# Patient Record
Sex: Male | Born: 1960 | Race: White | Hispanic: No | Marital: Married | Smoking: Never smoker
Health system: Southern US, Community
[De-identification: ages and names within clinical notes are randomized; demographics above are authoritative.]

## PROBLEM LIST (undated history)

## (undated) DIAGNOSIS — E785 Hyperlipidemia, unspecified: Secondary | ICD-10-CM

## (undated) DIAGNOSIS — M1120 Other chondrocalcinosis, unspecified site: Secondary | ICD-10-CM

## (undated) DIAGNOSIS — I10 Essential (primary) hypertension: Secondary | ICD-10-CM

## (undated) DIAGNOSIS — E1165 Type 2 diabetes mellitus with hyperglycemia: Principal | ICD-10-CM

## (undated) DIAGNOSIS — E1149 Type 2 diabetes mellitus with other diabetic neurological complication: Secondary | ICD-10-CM

## (undated) DIAGNOSIS — Q824 Ectodermal dysplasia (anhidrotic): Secondary | ICD-10-CM

## (undated) DIAGNOSIS — I25119 Atherosclerotic heart disease of native coronary artery with unspecified angina pectoris: Secondary | ICD-10-CM

## (undated) HISTORY — DX: Essential (primary) hypertension: I10

## (undated) HISTORY — DX: Type 2 diabetes mellitus with hyperglycemia: E11.65

## (undated) HISTORY — DX: Other chondrocalcinosis, unspecified site: M11.20

## (undated) HISTORY — DX: Type 2 diabetes mellitus with other diabetic neurological complication: E11.49

## (undated) HISTORY — DX: Atherosclerotic heart disease of native coronary artery with unspecified angina pectoris: I25.119

## (undated) HISTORY — DX: Hyperlipidemia, unspecified: E78.5

## (undated) HISTORY — DX: Ectodermal dysplasia (anhidrotic): Q82.4

## (undated) HISTORY — PX: APPENDECTOMY: SHX54

---

## 1986-02-27 HISTORY — PX: RIB FRACTURE SURGERY: SHX2358

## 2003-01-08 ENCOUNTER — Encounter: Payer: Self-pay | Admitting: Internal Medicine

## 2004-01-04 ENCOUNTER — Ambulatory Visit: Payer: Self-pay | Admitting: Internal Medicine

## 2004-07-04 ENCOUNTER — Ambulatory Visit: Payer: Self-pay | Admitting: Internal Medicine

## 2005-01-02 ENCOUNTER — Ambulatory Visit: Payer: Self-pay | Admitting: Internal Medicine

## 2005-07-10 ENCOUNTER — Ambulatory Visit: Payer: Self-pay | Admitting: Internal Medicine

## 2006-01-08 ENCOUNTER — Ambulatory Visit: Payer: Self-pay | Admitting: Internal Medicine

## 2007-10-14 ENCOUNTER — Telehealth: Payer: Self-pay | Admitting: Family Medicine

## 2008-02-20 ENCOUNTER — Telehealth: Payer: Self-pay | Admitting: Family Medicine

## 2008-04-23 ENCOUNTER — Ambulatory Visit: Payer: Self-pay | Admitting: Internal Medicine

## 2008-04-23 DIAGNOSIS — Q824 Ectodermal dysplasia (anhidrotic): Secondary | ICD-10-CM

## 2008-04-23 DIAGNOSIS — E1165 Type 2 diabetes mellitus with hyperglycemia: Secondary | ICD-10-CM

## 2008-04-23 DIAGNOSIS — IMO0002 Reserved for concepts with insufficient information to code with codable children: Secondary | ICD-10-CM

## 2008-04-23 DIAGNOSIS — I10 Essential (primary) hypertension: Secondary | ICD-10-CM

## 2008-04-23 HISTORY — DX: Reserved for concepts with insufficient information to code with codable children: IMO0002

## 2008-04-23 HISTORY — DX: Type 2 diabetes mellitus with hyperglycemia: E11.65

## 2008-04-23 LAB — HM DIABETES FOOT EXAM

## 2008-04-28 LAB — CONVERTED CEMR LAB
AST: 29 units/L (ref 0–37)
Albumin: 4 g/dL (ref 3.5–5.2)
Alkaline Phosphatase: 74 units/L (ref 39–117)
BUN: 20 mg/dL (ref 6–23)
Basophils Absolute: 0 10*3/uL (ref 0.0–0.1)
Basophils Relative: 0 % (ref 0.0–3.0)
Bilirubin, Direct: 0.1 mg/dL (ref 0.0–0.3)
CO2: 21 meq/L (ref 19–32)
Calcium: 9.8 mg/dL (ref 8.4–10.5)
Chloride: 100 meq/L (ref 96–112)
Creatinine, Ser: 1.2 mg/dL (ref 0.4–1.5)
Eosinophils Absolute: 0.2 10*3/uL (ref 0.0–0.7)
Glucose, Bld: 337 mg/dL — ABNORMAL HIGH (ref 70–99)
HDL: 28.4 mg/dL — ABNORMAL LOW (ref >39.0)
Lymphocytes Relative: 28.7 % (ref 12.0–46.0)
MCHC: 35.4 g/dL (ref 30.0–36.0)
MCV: 83.9 fL (ref 78.0–100.0)
Neutrophils Relative %: 66.6 % (ref 43.0–77.0)
Platelets: 174 10*3/uL (ref 150–400)
RBC: 5.15 M/uL (ref 4.22–5.81)
TSH: 1.33 microintl units/mL (ref 0.35–5.50)
Triglycerides: 759 mg/dL (ref 0–149)
VLDL: 152 mg/dL — ABNORMAL HIGH (ref 0–40)
WBC: 7.2 10*3/uL (ref 4.5–10.5)

## 2009-08-16 ENCOUNTER — Telehealth (INDEPENDENT_AMBULATORY_CARE_PROVIDER_SITE_OTHER): Payer: Self-pay | Admitting: *Deleted

## 2009-08-16 ENCOUNTER — Encounter: Payer: Self-pay | Admitting: Internal Medicine

## 2010-01-07 ENCOUNTER — Telehealth: Payer: Self-pay | Admitting: Internal Medicine

## 2010-03-29 NOTE — Progress Notes (Signed)
Summary: refill request for ibuprofen  Phone Note Refill Request Message from:  Fax from Pharmacy  Refills Requested: Medication #1:  IBUPROFEN 800 MG  TABS one by mouth three times a day as needed for pseudogout   Last Refilled: 03/26/2009  Medication #2:  GLIPIZIDE XL 5 MG XR24H-TAB 1 daily before  breakfast by mouth.  Medication #3:  METFORMIN HCL 1000 MG TABS 1 tablet twice a day by mouth Faxed request from [facility] is on your desk.  Initial call taken by: Marty Heck CMA,  August 16, 2009 8:49 AM  Follow-up for Phone Call        okay to refill #100 x 4 Follow-up by: Claris Gower MD,  August 16, 2009 12:24 PM    Prescriptions: GLIPIZIDE XL 5 MG XR24H-TAB (GLIPIZIDE) 1 daily before  breakfast by mouth  #30 x 0   Entered by:   Christena Deem CMA ([place])   Authorized by:   Claris Gower MD   Signed by:   Christena Deem CMA ([place]) on 08/16/2009   Method used:   Electronically to        [facility] [address] (retail)       [place], [place]  [address]       Ph: [phone]       Fax: [phone]   RxID:   [id number] METFORMIN HCL 1000 MG TABS (METFORMIN HCL) 1 tablet twice a day by mouth  #60 x 0   Entered by:   Christena Deem CMA ([place])   Authorized by:   Claris Gower MD   Signed by:   Christena Deem CMA ([place]) on 08/16/2009   Method used:   Electronically to        [facility] [address] (retail)       [place], [place]  [address]       Ph: [phone]       Fax: [phone]   RxID:   [id number] IBUPROFEN 800 MG  TABS (IBUPROFEN) one by mouth three times a day as needed for pseudogout  #100 x 0   Entered by:   Christena Deem CMA ([place])   Authorized by:   Claris Gower MD   Signed by:   Christena Deem CMA ([place]) on 08/16/2009   Method used:   Electronically to        [facility]. [address] (retail)       [place], [place]  [address]       Ph:  [phone]       Fax: [phone]   RxID:   [id number]

## 2010-03-29 NOTE — Progress Notes (Signed)
Summary: Med refills  Phone Note Refill Request Call back at [phone] Message from:  [facility] on January 07, 2010 11:55 AM  Refills Requested: Medication #1:  IBUPROFEN 800 MG  TABS one by mouth three times a day as needed for pseudogout   Last Refilled: 08/16/2009  Medication #2:  GLIPIZIDE XL 5 MG XR24H-TAB 1 daily before  breakfast by mouth.   Last Refilled: 10/20/2009  Medication #3:  PRINIVIL 20 MG TABS one tab by mouth once daily.   Last Refilled: 08/16/2009  Medication #4:  METFORMIN HCL 1000 MG TABS Take 1 tablet by mouth once a day   Last Refilled: 08/16/2009 faxed requests   Method Requested: Electronic Initial call taken by: DeShannon Smith CMA Deborra Medina),  January 07, 2010 11:56 AM Call placed to: Patient  Follow-up for Phone Call        calling pt to advise that he needs a follow-up in order to get refills, all the numbers in pt's chart are incorrect, called [facility] pharmacy to see if they have a number and they didn't. DeShannon Smith CMA Deborra Medina)  January 07, 2010 12:31 PM   patient called office and stated he does not have insurance and or a job and can't come in for office visit. Please advise. DeShannon Smith [place] Deborra Medina)  January 07, 2010 12:34 PM   Please have him apply for aid through [facility] for reduction in costs Okay to fill meds for 1 month pending that attempt I cannot continue to care for him without a visit. If he doesn't qualify for help from [facility] or cannot pay, he should [facility] Follow-up by: Claris Gower MD,  January 07, 2010 1:46 PM  Additional Follow-up for Phone Call Additional follow up Details #1::        per pt he applied for the reduced pay thru [facility] 2010 and never received anything back, per pt he would like to apply again, pt states [facility] [place]. Rx's sent for 32mth to [facility]. DeShannon Smith CMA Deborra Medina)  January 07, 2010 1:54 PM   form left at the front desk for  pt to pick-up [place] Deborra Medina)  January 07, 2010 1:58 PM   Caren Griffins,  Please look into this or facilitate his application since he never heard back the first time Claris Gower MD  January 07, 2010 2:02 PM   Sp w/ representative w/ Pt accting, they did not have a application on file.  The nurse Karena Addison)  sent pt a application for indigent care thru [employer] . We also sent him copy of information thru  [facility] in [place] thru  [facility] in [place]. These program can assist w/ medical care and medications. Mailed this information to the pt, also called pt detailed message.Marland KitchenMarland KitchenAllen Norris  January 10, 2010 8:28 AM   Thanks We are doing what we can and gave him some meds to carry him over till he can make arrangements for an appt somewhere  Additional Follow-up by: Claris Gower MD,  January 10, 2010 1:37 PM    -Prescriptions: GLIPIZIDE XL 5 MG XR24H-TAB (GLIPIZIDE) 1 daily before  breakfast by mouth  #30 x 0   Entered by:   Edwin Dada CMA ([place])   Authorized by:   Claris Gower MD   Signed by:   Edwin Dada CMA ([place]) on 01/07/2010   Method used:   Electronically  to        [facility] [address] (retail)       [place], [place]  [address]       Ph: [phone]       Fax: [phone]   RxID:   [id number] METFORMIN HCL 1000 MG TABS (METFORMIN HCL) Take 1 tablet by mouth once a day  #30 x 0   Entered by:   Edwin Dada CMA ([place])   Authorized by:   Claris Gower MD   Signed by:   Edwin Dada CMA ([place]) on 01/07/2010   Method used:   Electronically to        [facility] [address] (retail)       [place], [place]  [address]       Ph: [phone]       Fax: [phone]   RxID:   [id number] PRINIVIL 20 MG TABS (LISINOPRIL) one tab by mouth once daily.  #30 x 0   Entered by:   Edwin Dada CMA ([place])   Authorized by:   Claris Gower MD    Signed by:   Edwin Dada CMA ([place]) on 01/07/2010   Method used:   Electronically to        [facility] [address]* (retail)       [place], [place]  [address]       Ph: [phone]       Fax: [phone]   RxID:   [id number] IBUPROFEN 800 MG  TABS (IBUPROFEN) one by mouth three times a day as needed for pseudogout  #30 x 0   Entered by:   Edwin Dada CMA ([place])   Authorized by:   Claris Gower MD   Signed by:   Edwin Dada CMA ([place]) on 01/07/2010   Method used:   Electronically to        [facility] [address]* (retail)       [place], [place]  [address]       Ph: [phone]       Fax: [phone]   RxID:   [id number]

## 2010-03-29 NOTE — Miscellaneous (Signed)
  Medications Added METFORMIN HCL 1000 MG TABS (METFORMIN HCL) Take 1 tablet by mouth once a day       Clinical Lists Changes  Medications: Changed medication from METFORMIN HCL 1000 MG TABS (METFORMIN HCL) 1 tablet twice a day by mouth to METFORMIN HCL 1000 MG TABS (METFORMIN HCL) Take 1 tablet by mouth once a day

## 2010-04-02 ENCOUNTER — Ambulatory Visit: Payer: Self-pay

## 2010-04-05 ENCOUNTER — Encounter: Payer: Self-pay | Admitting: Internal Medicine

## 2010-04-20 NOTE — Letter (Signed)
Summary: Disability Exam/NCDDS  Disability Exam/NCDDS   Imported By: Edmonia James 04/13/2010 13:59:54  _____________________________________________________________________  External Attachment:    Type:   Image     Comment:   External Document  Appended Document: Disability Exam/NCDDS disabled mostly due to gout

## 2010-05-18 ENCOUNTER — Other Ambulatory Visit: Payer: Self-pay | Admitting: Internal Medicine

## 2010-05-18 MED ORDER — IBUPROFEN 800 MG PO TABS: ORAL_TABLET | ORAL | Status: DC

## 2010-05-18 NOTE — Telephone Encounter (Signed)
Okay #100 x 3  Let him know he can get 750 of the 200mg  tabs for about $7.50 at [facility] (this would be equivalent of almost 200 of the prescription 800mg  dose)

## 2010-10-19 ENCOUNTER — Other Ambulatory Visit: Payer: Self-pay | Admitting: Internal Medicine

## 2010-11-22 ENCOUNTER — Other Ambulatory Visit: Payer: Self-pay | Admitting: Internal Medicine

## 2011-01-24 ENCOUNTER — Encounter: Payer: Self-pay | Admitting: Internal Medicine

## 2011-01-25 ENCOUNTER — Encounter: Payer: Self-pay | Admitting: Internal Medicine

## 2011-01-25 ENCOUNTER — Ambulatory Visit (INDEPENDENT_AMBULATORY_CARE_PROVIDER_SITE_OTHER): Payer: Self-pay | Admitting: Internal Medicine

## 2011-01-25 DIAGNOSIS — I10 Essential (primary) hypertension: Secondary | ICD-10-CM

## 2011-01-25 DIAGNOSIS — E1149 Type 2 diabetes mellitus with other diabetic neurological complication: Secondary | ICD-10-CM

## 2011-01-25 DIAGNOSIS — E785 Hyperlipidemia, unspecified: Secondary | ICD-10-CM

## 2011-01-25 MED ORDER — LISINOPRIL 20 MG PO TABS: 20.0000 mg | ORAL_TABLET | Freq: Every day | ORAL | Status: DC

## 2011-01-25 MED ORDER — GLIPIZIDE 5 MG PO TABS: 5.0000 mg | ORAL_TABLET | Freq: Two times a day (BID) | ORAL | Status: DC

## 2011-01-25 MED ORDER — IBUPROFEN 800 MG PO TABS: ORAL_TABLET | ORAL | Status: DC

## 2011-01-25 MED ORDER — METFORMIN HCL 1000 MG PO TABS: 1000.0000 mg | ORAL_TABLET | Freq: Two times a day (BID) | ORAL | Status: DC

## 2011-01-25 NOTE — Assessment & Plan Note (Signed)
LDL ~150 Will plan to start statin next time (after on the other meds for a while)

## 2011-01-25 NOTE — Progress Notes (Signed)
  Subjective:    Patient ID: Aaron Mckenzie, male    DOB: 06-02-1960, 50 y.o.   MRN: [id number]  HPI Finally back to work at the [employer] in [place] Now has insurance again  No meds for BP or diabetes for some time Occ checks BP --- fluctuates between 120-150 Still keeps up with yard work  Did have slight uncomfortable feeling in chest this AM Has happened a couple of times Wondering if it is related to increased BP Never occurs during exertion Breathing has been fine  Doesn't check sugars at all  Ongoing pain in knees and feet Ibuprofen helps the knees only Has drainage now from elbows for many months   Current Outpatient Prescriptions on File Prior to Visit  Medication Sig Dispense Refill  . glipiZIDE (GLUCOTROL XL) 5 MG 24 hr tablet Take 5 mg by mouth daily.        Marland Kitchen ibuprofen (ADVIL,MOTRIN) 800 MG tablet Take one tablet by mouth three times daily as needed for pseudogout  100 tablet  3  . lisinopril (PRINIVIL,ZESTRIL) 20 MG tablet Take 20 mg by mouth daily.        . metFORMIN (GLUCOPHAGE) 1000 MG tablet Take 1,000 mg by mouth daily with breakfast.          No Known Allergies  Past Medical History  Diagnosis Date  . Diabetes mellitus   . Hypertension   . Pseudogout   . Ectodermal dysplasia     Past Surgical History  Procedure Date  . Appendectomy   . Rib fracture surgery 1988    right    Family History  Problem Relation Age of Onset  . Arthritis Mother   . Arthritis Father     History   Social History  . Marital Status: Single    Spouse Name: N/A    Number of Children: N/A  . Years of Education: N/A   Occupational History  . student    Social History Main Topics  . Smoking status: Never Smoker   . Smokeless tobacco: Never Used  . Alcohol Use: No  . Drug Use: No  . Sexually Active: Not on file   Other Topics Concern  . Not on file   Social History Narrative  . No narrative on file   Review of Systems Constant feet pain--affects his  sleep (has to get up after 2-3 hours). Does have some PM somnolence Weight is down a few pounds lately     Objective:   Physical Exam  Constitutional: He appears well-developed and well-nourished. No distress.  Neck: Normal range of motion. Neck supple.  Cardiovascular: Normal rate, regular rhythm and normal heart sounds.  Exam reveals no gallop.   No murmur heard.      Faint pedal pulses  Pulmonary/Chest: Effort normal and breath sounds normal. No respiratory distress. He has no wheezes. He has no rales.  Musculoskeletal: He exhibits no edema.       Red, thickened olecranon bursae bilaterally  Lymphadenopathy:    He has no cervical adenopathy.  Neurological:       Decreased sensation on plantar feet  Skin:       No foot lesions  Psychiatric: He has a normal mood and affect. His behavior is normal. Judgment and thought content normal.          Assessment & Plan:

## 2011-01-25 NOTE — Assessment & Plan Note (Signed)
BP Readings from Last 3 Encounters:  01/25/11 153/97  04/23/08 140/90   Was okay on the med Will restart lisinopril Check renal function on follow up

## 2011-01-25 NOTE — Assessment & Plan Note (Signed)
Ibuprofen gives relief

## 2011-01-25 NOTE — Assessment & Plan Note (Signed)
Has been out of meds for some time Will restart the metformin and glipizide Check labs at follow up

## 2011-03-15 ENCOUNTER — Ambulatory Visit: Payer: Self-pay | Admitting: Internal Medicine

## 2011-03-23 ENCOUNTER — Ambulatory Visit: Payer: Self-pay | Admitting: Internal Medicine

## 2011-03-23 ENCOUNTER — Encounter: Payer: Self-pay | Admitting: Internal Medicine

## 2011-03-23 ENCOUNTER — Ambulatory Visit (INDEPENDENT_AMBULATORY_CARE_PROVIDER_SITE_OTHER): Payer: Managed Care, Other (non HMO) | Admitting: Internal Medicine

## 2011-03-23 VITALS — BP 122/80 | HR 92 | Temp 97.7°F | Ht 69.0 in | Wt 203.0 lb

## 2011-03-23 DIAGNOSIS — I10 Essential (primary) hypertension: Secondary | ICD-10-CM

## 2011-03-23 DIAGNOSIS — E1149 Type 2 diabetes mellitus with other diabetic neurological complication: Secondary | ICD-10-CM

## 2011-03-23 DIAGNOSIS — R079 Chest pain, unspecified: Secondary | ICD-10-CM

## 2011-03-23 DIAGNOSIS — E785 Hyperlipidemia, unspecified: Secondary | ICD-10-CM

## 2011-03-23 MED ORDER — GABAPENTIN 300 MG PO CAPS: 300.0000 mg | ORAL_CAPSULE | Freq: Every day | ORAL | Status: DC

## 2011-03-23 NOTE — Patient Instructions (Signed)
Please start the gabapentin at 1 capsule nightly (300mg ) for 3 days. If the pain isn't much better, increase to 2 capsules nightly. If the pain isn't much improved on this dose after a couple of weeks, please call

## 2011-03-23 NOTE — Assessment & Plan Note (Signed)
Will consider Rx if LDL over 100 Pravastatin for $10 plan

## 2011-03-23 NOTE — Assessment & Plan Note (Addendum)
Sounds very atypical EKG completely normal Discussed worrisome symptoms like fatigue and SOB---to call 911 Otherwise just observation

## 2011-03-23 NOTE — Progress Notes (Signed)
  Subjective:    Patient ID: Aaron Mckenzie, male    DOB: September 01, 1960, 51 y.o.   MRN: [id number]  HPI Doing okay Back on meds  Hasn't been checking sugars No hypoglycemic reactions Really troubled by foot pain --burning. Mostly at night and not too bad in day  Gets some chest pain at work Dull ache by sternum Doesn't slow down No diaphoresis, SOB or nausea Not heartburn Lasts 15 minutes at most----at most once a week He feels it could be stress related  Current Outpatient Prescriptions on File Prior to Visit  Medication Sig Dispense Refill  . glipiZIDE (GLUCOTROL) 5 MG tablet Take 1 tablet (5 mg total) by mouth 2 (two) times daily before a meal.  180 tablet  3  . ibuprofen (ADVIL,MOTRIN) 800 MG tablet Take one tablet by mouth three times daily as needed for pseudogout  100 tablet  3  . lisinopril (PRINIVIL,ZESTRIL) 20 MG tablet Take 1 tablet (20 mg total) by mouth daily.  90 tablet  3  . metFORMIN (GLUCOPHAGE) 1000 MG tablet Take 1 tablet (1,000 mg total) by mouth 2 (two) times daily with a meal.  180 tablet  3    No Known Allergies  Past Medical History  Diagnosis Date  . Diabetes mellitus   . Hypertension   . Pseudogout   . Ectodermal dysplasia   . Hyperlipidemia     Past Surgical History  Procedure Date  . Appendectomy   . Rib fracture surgery 1988    right    Family History  Problem Relation Age of Onset  . Arthritis Mother   . Arthritis Father     History   Social History  . Marital Status: Single    Spouse Name: N/A    Number of Children: N/A  . Years of Education: N/A   Occupational History  . student    Social History Main Topics  . Smoking status: Never Smoker   . Smokeless tobacco: Never Used  . Alcohol Use: No  . Drug Use: No  . Sexually Active: Not on file   Other Topics Concern  . Not on file   Social History Narrative  . No narrative on file   Review of Systems Has lost some weight--may be due to being back at work Sleep is  not great. Only 2-3 hours. Has to get up due to pain    Objective:   Physical Exam  Constitutional: He appears well-developed and well-nourished. No distress.  Neck: Normal range of motion. Neck supple. No thyromegaly present.  Cardiovascular: Normal rate, regular rhythm, normal heart sounds and intact distal pulses.  Exam reveals no gallop.   No murmur heard. Pulmonary/Chest: Effort normal and breath sounds normal. No respiratory distress. He has no wheezes. He has no rales.  Abdominal: Soft. There is no tenderness.  Musculoskeletal: He exhibits no edema and no tenderness.  Lymphadenopathy:    He has no cervical adenopathy.  Skin:       Scaling and dystrophic nails on feet but no ulcers  Psychiatric: He has a normal mood and affect. His behavior is normal. Judgment and thought content normal.          Assessment & Plan:

## 2011-03-23 NOTE — Assessment & Plan Note (Signed)
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

## 2011-03-23 NOTE — Assessment & Plan Note (Signed)
BP Readings from Last 3 Encounters:  03/23/11 122/80  01/25/11 153/97  04/23/08 140/90   Back under control with lisinopril

## 2011-03-24 LAB — CBC WITH DIFFERENTIAL/PLATELET
Basophils Absolute: 0 10*3/uL (ref 0.0–0.1)
Eosinophils Relative: 3.9 % (ref 0.0–5.0)
Lymphocytes Relative: 25.3 % (ref 12.0–46.0)
Lymphs Abs: 2 10*3/uL (ref 0.7–4.0)
Monocytes Relative: 4.8 % (ref 3.0–12.0)
Neutrophils Relative %: 65.8 % (ref 43.0–77.0)
Platelets: 265 10*3/uL (ref 150.0–400.0)
RDW: 13.7 % (ref 11.5–14.6)
WBC: 7.8 10*3/uL (ref 4.5–10.5)

## 2011-03-24 LAB — LIPID PANEL
HDL: 34.8 mg/dL — ABNORMAL LOW (ref >39.00)
Total CHOL/HDL Ratio: 7
Triglycerides: 453 mg/dL — ABNORMAL HIGH (ref 0.0–149.0)

## 2011-03-24 LAB — HEPATIC FUNCTION PANEL
Albumin: 4.3 g/dL (ref 3.5–5.2)
Alkaline Phosphatase: 58 U/L (ref 39–117)
Bilirubin, Direct: 0 mg/dL (ref 0.0–0.3)
Total Bilirubin: 0.7 mg/dL (ref 0.3–1.2)

## 2011-03-24 LAB — BASIC METABOLIC PANEL
BUN: 34 mg/dL — ABNORMAL HIGH (ref 6–23)
Calcium: 10.1 mg/dL (ref 8.4–10.5)
Creatinine, Ser: 1.6 mg/dL — ABNORMAL HIGH (ref 0.4–1.5)
GFR: 47.6 mL/min — ABNORMAL LOW (ref >60.00)
Glucose, Bld: 77 mg/dL (ref 70–99)

## 2011-03-24 LAB — MICROALBUMIN / CREATININE URINE RATIO
Creatinine,U: 51.8 mg/dL
Microalb, Ur: 5.8 mg/dL — ABNORMAL HIGH (ref 0.0–1.9)

## 2011-03-24 LAB — TSH: TSH: 1.27 u[IU]/mL (ref 0.35–5.50)

## 2011-03-31 ENCOUNTER — Telehealth: Payer: Self-pay | Admitting: Internal Medicine

## 2011-03-31 ENCOUNTER — Encounter: Payer: Self-pay | Admitting: *Deleted

## 2011-03-31 MED ORDER — PRAVASTATIN SODIUM 40 MG PO TABS: 40.0000 mg | ORAL_TABLET | Freq: Every day | ORAL | Status: DC

## 2011-03-31 NOTE — Telephone Encounter (Signed)
Pt called for most recent lab results. Call back # [phone]

## 2011-03-31 NOTE — Telephone Encounter (Signed)
rx sent to pharmacy by e-script Spoke with patient and advised results letter mailed to patients home address with results.

## 2011-06-01 ENCOUNTER — Other Ambulatory Visit: Payer: Self-pay | Admitting: Internal Medicine

## 2011-06-02 ENCOUNTER — Telehealth: Payer: Self-pay

## 2011-06-02 MED ORDER — GABAPENTIN 300 MG PO CAPS: 600.0000 mg | ORAL_CAPSULE | Freq: Two times a day (BID) | ORAL | Status: DC

## 2011-06-02 NOTE — Telephone Encounter (Signed)
rx sent to pharmacy by e-script Spoke with patient and advised results   

## 2011-06-02 NOTE — Telephone Encounter (Signed)
Okay to change to 2 tabs bid #120 x 5

## 2011-06-02 NOTE — Telephone Encounter (Signed)
Pt said has been taking Gabapentin 300 mg 2 tabs in AM and 2 tabs at bedtime and that has helped his foot pain. Gabapentin 300 mg instructions are 1-2 tabs at bedtime. Pt request Gabapentin 300 mg taking 2 twice a day sent to [facility]. Pt can be reached [phone].

## 2011-06-21 ENCOUNTER — Encounter: Payer: Self-pay | Admitting: Internal Medicine

## 2011-06-21 ENCOUNTER — Ambulatory Visit (INDEPENDENT_AMBULATORY_CARE_PROVIDER_SITE_OTHER): Payer: Managed Care, Other (non HMO) | Admitting: Internal Medicine

## 2011-06-21 VITALS — BP 138/70 | HR 86 | Temp 97.5°F | Ht 69.0 in | Wt 207.0 lb

## 2011-06-21 DIAGNOSIS — E1142 Type 2 diabetes mellitus with diabetic polyneuropathy: Secondary | ICD-10-CM

## 2011-06-21 DIAGNOSIS — E785 Hyperlipidemia, unspecified: Secondary | ICD-10-CM

## 2011-06-21 DIAGNOSIS — E1149 Type 2 diabetes mellitus with other diabetic neurological complication: Secondary | ICD-10-CM

## 2011-06-21 DIAGNOSIS — I10 Essential (primary) hypertension: Secondary | ICD-10-CM

## 2011-06-21 LAB — BASIC METABOLIC PANEL
BUN: 35 mg/dL — ABNORMAL HIGH (ref 6–23)
CO2: 18 mEq/L — ABNORMAL LOW (ref 19–32)
Chloride: 110 mEq/L (ref 96–112)
Glucose, Bld: 92 mg/dL (ref 70–99)
Potassium: 4.2 mEq/L (ref 3.5–5.1)
Sodium: 138 mEq/L (ref 135–145)

## 2011-06-21 LAB — HEPATIC FUNCTION PANEL
ALT: 21 U/L (ref 0–53)
AST: 17 U/L (ref 0–37)
Albumin: 4.2 g/dL (ref 3.5–5.2)
Alkaline Phosphatase: 63 U/L (ref 39–117)
Total Protein: 7.6 g/dL (ref 6.0–8.3)

## 2011-06-21 LAB — LIPID PANEL
Cholesterol: 196 mg/dL (ref 0–200)
VLDL: 112.2 mg/dL — ABNORMAL HIGH (ref 0.0–40.0)

## 2011-06-21 LAB — HEMOGLOBIN A1C: Hgb A1c MFr Bld: 7 % — ABNORMAL HIGH (ref 4.6–6.5)

## 2011-06-21 NOTE — Assessment & Plan Note (Signed)
Lab Results  Component Value Date   HGBA1C 9.8* 03/23/2011   Hopefully much better control since back on meds Will recheck

## 2011-06-21 NOTE — Assessment & Plan Note (Signed)
Tolerating the meds Will check labs

## 2011-06-21 NOTE — Progress Notes (Signed)
  Subjective:    Patient ID: Aaron Mckenzie, male    DOB: 1960-05-29, 51 y.o.   MRN: [id number]  HPI Doing okay Foot pain is reasonably controlled in day---does note increased pain at night Not a big problems when at work---when he is on his feet (does note some shooting pains up his leg by the end of his shift) Does awaken with pain at times at night  Hasn't been checking sugars Will give machine No hypoglycemic reactions  No problems with statin No aching or stomach troubles  Gets occ chest pain ---at work mostly.  Clearly seems to be muscular---intermittent and brief No SOB, nausea or diaphoresis with pain No dizziness or syncope  Current Outpatient Prescriptions on File Prior to Visit  Medication Sig Dispense Refill  . glipiZIDE (GLUCOTROL) 5 MG tablet Take 1 tablet (5 mg total) by mouth 2 (two) times daily before a meal.  180 tablet  3  . ibuprofen (ADVIL,MOTRIN) 800 MG tablet TAKE ONE TABLET BY MOUTH THREE TIMES DAILY AS NEEDED FOR PSEUDOGOUT  100 tablet  2  . lisinopril (PRINIVIL,ZESTRIL) 20 MG tablet Take 1 tablet (20 mg total) by mouth daily.  90 tablet  3  . metFORMIN (GLUCOPHAGE) 1000 MG tablet Take 1 tablet (1,000 mg total) by mouth 2 (two) times daily with a meal.  180 tablet  3  . pravastatin (PRAVACHOL) 40 MG tablet Take 1 tablet (40 mg total) by mouth daily.  90 tablet  3  . DISCONTD: gabapentin (NEURONTIN) 300 MG capsule Take 2 capsules (600 mg total) by mouth 2 (two) times daily.  120 capsule  5    No Known Allergies  Past Medical History  Diagnosis Date  . Diabetes mellitus   . Hypertension   . Pseudogout   . Ectodermal dysplasia   . Hyperlipidemia     Past Surgical History  Procedure Date  . Appendectomy   . Rib fracture surgery 1988    right    Family History  Problem Relation Age of Onset  . Arthritis Mother   . Arthritis Father     History   Social History  . Marital Status: Single    Spouse Name: N/A    Number of Children: N/A  .  Years of Education: N/A   Occupational History  . student    Social History Main Topics  . Smoking status: Never Smoker   . Smokeless tobacco: Never Used  . Alcohol Use: No  . Drug Use: No  . Sexually Active: Not on file   Other Topics Concern  . Not on file   Social History Narrative  . 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 No distress.  Neck: Normal range of motion. Neck supple. No thyromegaly present.  Cardiovascular: Normal rate, regular rhythm, normal heart sounds and intact distal pulses.  Exam reveals no gallop.   No murmur heard. Pulmonary/Chest: Effort normal and breath sounds normal. No respiratory distress. He has no wheezes. He has no rales.  Abdominal: Soft. There is no tenderness.  Musculoskeletal: He exhibits no edema and no tenderness.  Lymphadenopathy:    He has no cervical adenopathy.  Skin: No rash noted.       No foot lesions          Assessment & Plan:

## 2011-06-21 NOTE — Patient Instructions (Addendum)
Increase the gabapentin to 3 capsules at night. If the pain is still bad in a week, you can increase to 4 capsules at bedtime Please start a coated aspirin, 81mg , daily

## 2011-06-21 NOTE — Assessment & Plan Note (Signed)
BP Readings from Last 3 Encounters:  06/21/11 138/70  03/23/11 122/80  01/25/11 153/97   Good control on meds No change

## 2011-06-22 ENCOUNTER — Encounter: Payer: Self-pay | Admitting: *Deleted

## 2011-07-28 ENCOUNTER — Other Ambulatory Visit (INDEPENDENT_AMBULATORY_CARE_PROVIDER_SITE_OTHER): Payer: Managed Care, Other (non HMO)

## 2011-07-28 DIAGNOSIS — R6889 Other general symptoms and signs: Secondary | ICD-10-CM

## 2011-07-28 DIAGNOSIS — R899 Unspecified abnormal finding in specimens from other organs, systems and tissues: Secondary | ICD-10-CM

## 2011-07-28 LAB — BASIC METABOLIC PANEL
BUN: 32 mg/dL — ABNORMAL HIGH (ref 6–23)
CO2: 21 mEq/L (ref 19–32)
Chloride: 111 mEq/L (ref 96–112)
Creatinine, Ser: 1.6 mg/dL — ABNORMAL HIGH (ref 0.4–1.5)
Potassium: 4.3 mEq/L (ref 3.5–5.1)

## 2011-08-10 ENCOUNTER — Encounter: Payer: Self-pay | Admitting: *Deleted

## 2011-08-18 ENCOUNTER — Other Ambulatory Visit: Payer: Self-pay | Admitting: Internal Medicine

## 2011-09-27 ENCOUNTER — Ambulatory Visit: Payer: Managed Care, Other (non HMO) | Admitting: Internal Medicine

## 2011-10-04 ENCOUNTER — Other Ambulatory Visit: Payer: Self-pay | Admitting: Internal Medicine

## 2011-10-08 IMAGING — CR DG KNEE 1-2V*R*
1 series · 2 of 2 positions shown · non-contrast
Comparison: none

REASON FOR EXAM: rt knee, foot and ankle pain
COMMENTS:

[Series 1: view not recorded · 0.17mm/px · 2 of 2 slices shown]
[im 1/2]
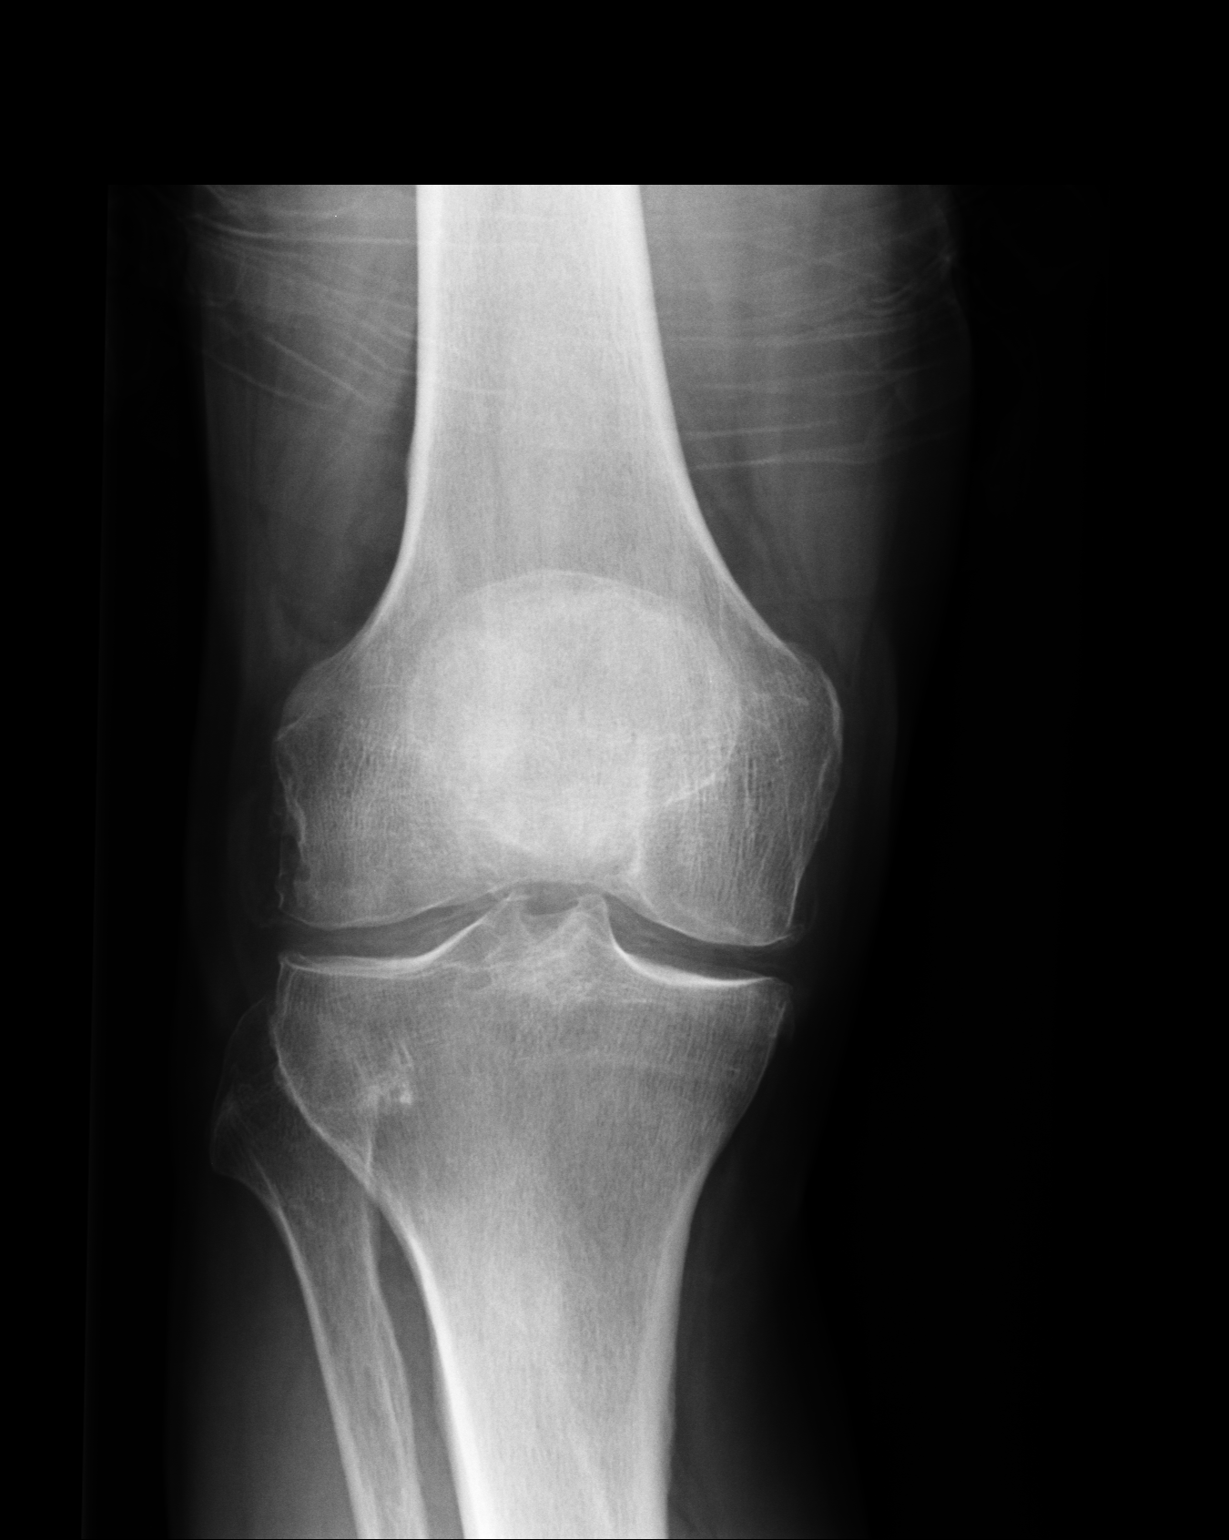
[im 2/2]
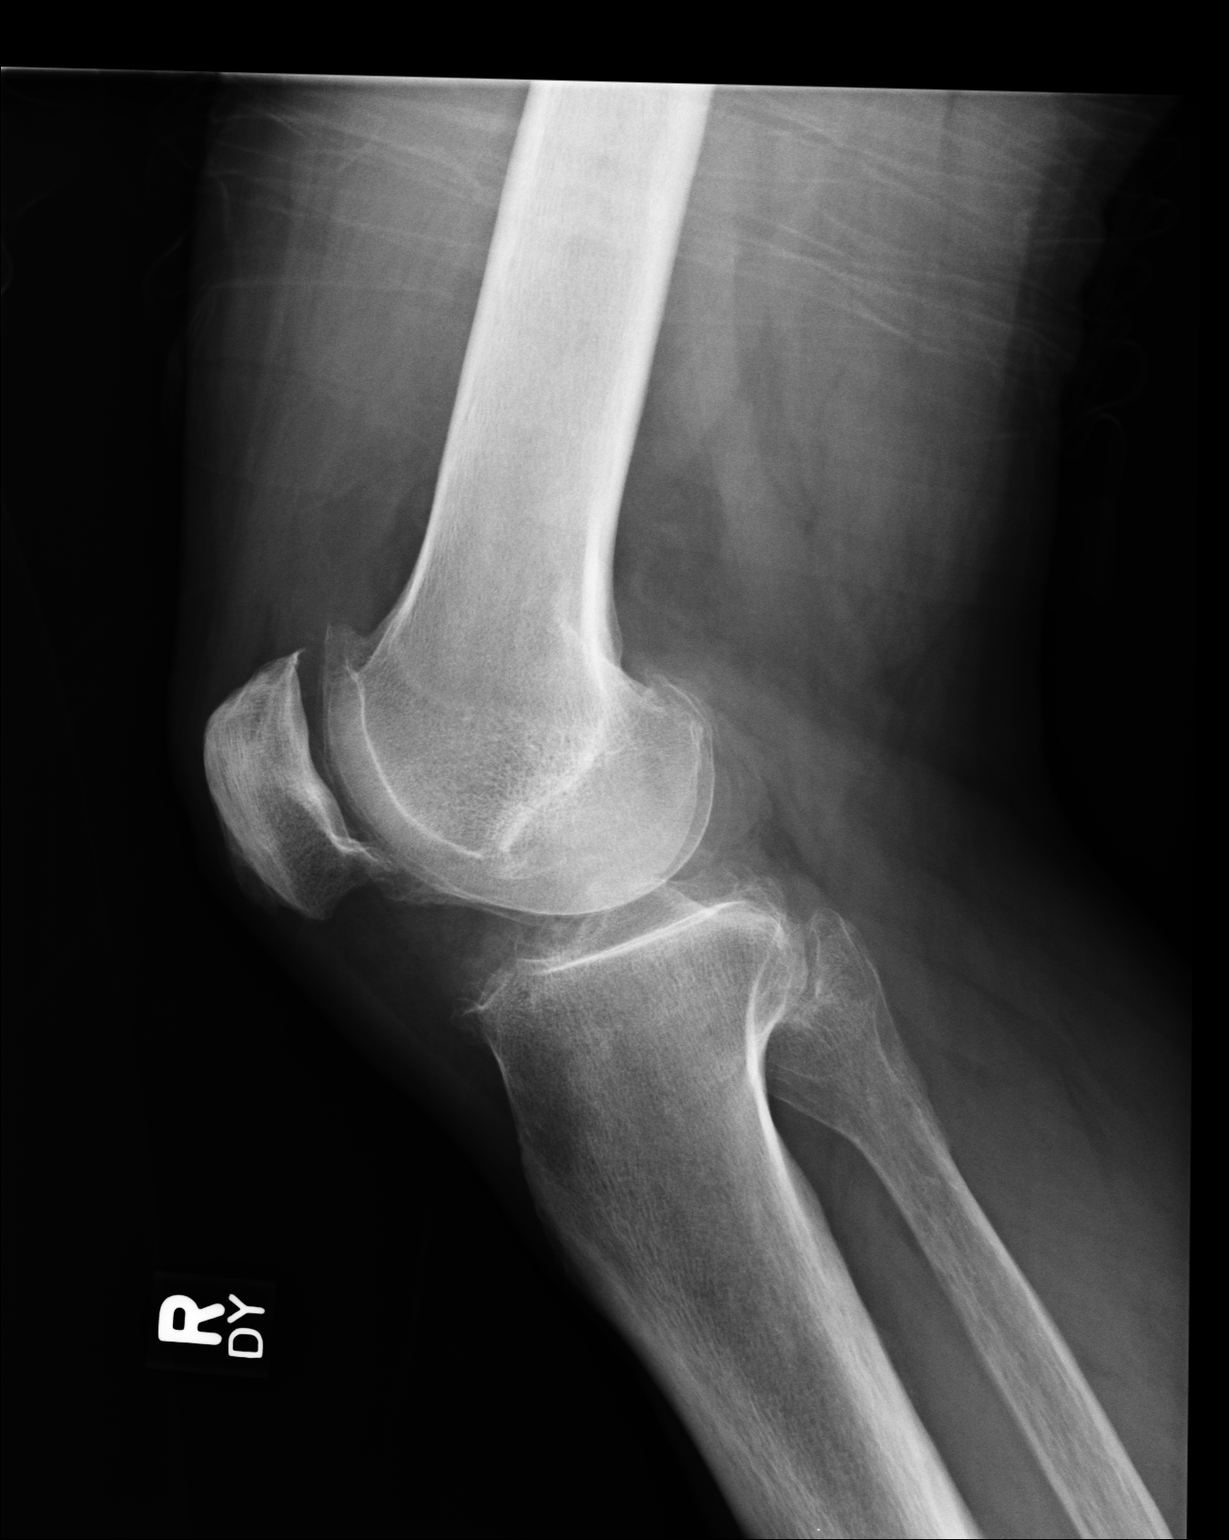

[2 of 2 positions shown; findings below may reference images not displayed]

PROCEDURE:     DXR - DXR KNEE RIGHT AP AND LATERAL  - April 02, 2010 [DATE]

RESULT:     AP and lateral views of the right knee reveal the bones to be
adequately mineralized for age. There is mild degenerative change involving
all 3 joint compartments. I cannot exclude a small joint effusion. I do not
see evidence of an acute fracture.
IMPRESSION: There is mild degenerative change involving all 3 joint
compartments.

## 2011-12-05 ENCOUNTER — Other Ambulatory Visit: Payer: Self-pay | Admitting: Internal Medicine

## 2011-12-05 NOTE — Telephone Encounter (Signed)
rx sent to pharmacy by e-script  

## 2011-12-05 NOTE — Telephone Encounter (Signed)
Okay #100 x 5

## 2011-12-20 ENCOUNTER — Ambulatory Visit: Payer: Managed Care, Other (non HMO) | Admitting: Internal Medicine

## 2011-12-20 DIAGNOSIS — Z0289 Encounter for other administrative examinations: Secondary | ICD-10-CM

## 2011-12-30 ENCOUNTER — Other Ambulatory Visit: Payer: Self-pay | Admitting: Internal Medicine

## 2012-01-08 ENCOUNTER — Encounter: Payer: Self-pay | Admitting: Internal Medicine

## 2012-01-08 ENCOUNTER — Ambulatory Visit (INDEPENDENT_AMBULATORY_CARE_PROVIDER_SITE_OTHER): Payer: Medicare Other | Admitting: Internal Medicine

## 2012-01-08 VITALS — BP 110/70 | HR 100 | Temp 98.1°F | Wt 228.0 lb

## 2012-01-08 DIAGNOSIS — I10 Essential (primary) hypertension: Secondary | ICD-10-CM | POA: Diagnosis not present

## 2012-01-08 DIAGNOSIS — E1142 Type 2 diabetes mellitus with diabetic polyneuropathy: Secondary | ICD-10-CM | POA: Diagnosis not present

## 2012-01-08 DIAGNOSIS — E785 Hyperlipidemia, unspecified: Secondary | ICD-10-CM

## 2012-01-08 DIAGNOSIS — R7989 Other specified abnormal findings of blood chemistry: Secondary | ICD-10-CM

## 2012-01-08 DIAGNOSIS — E1149 Type 2 diabetes mellitus with other diabetic neurological complication: Secondary | ICD-10-CM

## 2012-01-08 DIAGNOSIS — R799 Abnormal finding of blood chemistry, unspecified: Secondary | ICD-10-CM

## 2012-01-08 LAB — BASIC METABOLIC PANEL
CO2: 17 mEq/L — ABNORMAL LOW (ref 19–32)
Calcium: 9.5 mg/dL (ref 8.4–10.5)
Chloride: 104 mEq/L (ref 96–112)
Creatinine, Ser: 1.9 mg/dL — ABNORMAL HIGH (ref 0.4–1.5)
Glucose, Bld: 392 mg/dL — ABNORMAL HIGH (ref 70–99)

## 2012-01-08 MED ORDER — GABAPENTIN 300 MG PO CAPS: 600.0000 mg | ORAL_CAPSULE | Freq: Two times a day (BID) | ORAL | Status: DC

## 2012-01-08 NOTE — Progress Notes (Signed)
Subjective:    Patient ID: Aaron Mckenzie, male    DOB: 1960/11/30, 51 y.o.   MRN: [id number]  HPI Here for follow up Weight is up 20# since spring Thinks it is from eating after leaving work at [employer] every night Plans to get back to [employer] sugars once a week 105-110 usually No hypoglyemic reactions No recent eye exam--had seen Dr Ellin Mayhew  No chest pain Did have some right flank discomfort at work yesterday--felt like spasm (thinks it was nerves since it was his last day) No SOB Some edema---mostly on left. Better after up at night  Nerve pain is constant Worse in bed Better when he is standing on them Taking the gabapentin 600mg  tid Still with evening symptoms Now with some numbness and pain in fingers. Tingling at times. Will feel cold  Current Outpatient Prescriptions on File Prior to Visit  Medication Sig Dispense Refill  . aspirin EC 81 MG tablet Take 81 mg by mouth daily.      Marland Kitchen gabapentin (NEURONTIN) 300 MG capsule TAKE TWO CAPSULES BY MOUTH TWICE DAILY  120 capsule  4  . glipiZIDE (GLUCOTROL) 5 MG tablet Take 1 tablet (5 mg total) by mouth 2 (two) times daily before a meal.  180 tablet  3  . ibuprofen (ADVIL,MOTRIN) 800 MG tablet TAKE ONE TABLET BY MOUTH THREE TIMES DAILY AS NEEDED FOR PSEUDOGOUT  100 tablet  5  . lisinopril (PRINIVIL,ZESTRIL) 20 MG tablet TAKE 1 TABLET (20 MG TOTAL) BY MOUTH DAILY.  90 tablet  0  . metFORMIN (GLUCOPHAGE) 1000 MG tablet Take 1 tablet (1,000 mg total) by mouth 2 (two) times daily with a meal.  180 tablet  3  . pravastatin (PRAVACHOL) 40 MG tablet Take 1 tablet (40 mg total) by mouth daily.  90 tablet  3  . [DISCONTINUED] gabapentin (NEURONTIN) 300 MG capsule Take 600 mg by mouth daily. And 3-4 at bedtime        No Known Allergies  Past Medical History  Diagnosis Date  . Diabetes mellitus   . Hypertension   . Pseudogout   . Ectodermal dysplasia   . Hyperlipidemia     Past Surgical History  Procedure Date  .  Appendectomy   . Rib fracture surgery 1988    right    Family History  Problem Relation Age of Onset  . Arthritis Mother   . Arthritis Father     History   Social History  . Marital Status: Single    Spouse Name: N/A    Number of Children: N/A  . Years of Education: N/A   Occupational History  .      Social History Main Topics  . Smoking status: Never Smoker   . Smokeless tobacco: Never Used  . Alcohol Use: No  . Drug Use: No  . Sexually Active: Not on file   Other Topics Concern  . Not on file   Social History Narrative   Disabled now--on [place] been working part time at [employer] Sleeps okay Intermittent nocturia--relates to eating/drinking late Bowels are okay     Objective:   Physical Exam  Constitutional: He appears well-developed and well-nourished. No distress.  Neck: Normal range of motion. Neck supple. No thyromegaly present.  Cardiovascular: Normal rate, regular rhythm, normal heart sounds and intact distal pulses.  Exam reveals no gallop.   No murmur heard. Pulmonary/Chest: Effort normal and breath sounds normal. No respiratory distress. He has no  wheezes. He has no rales.  Musculoskeletal: He exhibits edema.       1+ pitting edema in left foot and calf Trace on right  Lymphadenopathy:    He has no cervical adenopathy.  Skin: No rash noted.  Psychiatric: He has a normal mood and affect. His behavior is normal.          Assessment & Plan:

## 2012-01-08 NOTE — Assessment & Plan Note (Signed)
No problems with statin 

## 2012-01-08 NOTE — Assessment & Plan Note (Signed)
Seems to still have good control despite the weight gain Encouraged him to watch eating and get back to gym Check a1c

## 2012-01-08 NOTE — Assessment & Plan Note (Signed)
Having more trouble with feet at night Will increase the gabapentin

## 2012-01-08 NOTE — Patient Instructions (Signed)
Please set up your diabetic eye exam with Dr Ellin Mayhew

## 2012-01-08 NOTE — Assessment & Plan Note (Signed)
BP Readings from Last 3 Encounters:  01/08/12 110/70  06/21/11 138/70  03/23/11 122/80   Still well controlled Will recheck renal function

## 2012-01-17 DIAGNOSIS — H40009 Preglaucoma, unspecified, unspecified eye: Secondary | ICD-10-CM | POA: Diagnosis not present

## 2012-01-17 DIAGNOSIS — H40019 Open angle with borderline findings, low risk, unspecified eye: Secondary | ICD-10-CM | POA: Diagnosis not present

## 2012-01-17 DIAGNOSIS — E119 Type 2 diabetes mellitus without complications: Secondary | ICD-10-CM | POA: Diagnosis not present

## 2012-04-13 ENCOUNTER — Other Ambulatory Visit: Payer: Self-pay

## 2012-05-23 ENCOUNTER — Other Ambulatory Visit: Payer: Self-pay | Admitting: Internal Medicine

## 2012-06-26 ENCOUNTER — Other Ambulatory Visit: Payer: Self-pay | Admitting: Internal Medicine

## 2012-07-08 ENCOUNTER — Ambulatory Visit (INDEPENDENT_AMBULATORY_CARE_PROVIDER_SITE_OTHER): Payer: Medicare Other | Admitting: Internal Medicine

## 2012-07-08 ENCOUNTER — Encounter: Payer: Self-pay | Admitting: Internal Medicine

## 2012-07-08 VITALS — BP 110/70 | HR 89 | Temp 98.0°F | Wt 234.0 lb

## 2012-07-08 DIAGNOSIS — E1142 Type 2 diabetes mellitus with diabetic polyneuropathy: Secondary | ICD-10-CM

## 2012-07-08 DIAGNOSIS — E785 Hyperlipidemia, unspecified: Secondary | ICD-10-CM

## 2012-07-08 DIAGNOSIS — Z1211 Encounter for screening for malignant neoplasm of colon: Secondary | ICD-10-CM

## 2012-07-08 DIAGNOSIS — I10 Essential (primary) hypertension: Secondary | ICD-10-CM | POA: Diagnosis not present

## 2012-07-08 DIAGNOSIS — R351 Nocturia: Secondary | ICD-10-CM

## 2012-07-08 DIAGNOSIS — Z0000 Encounter for general adult medical examination without abnormal findings: Secondary | ICD-10-CM

## 2012-07-08 DIAGNOSIS — E1149 Type 2 diabetes mellitus with other diabetic neurological complication: Secondary | ICD-10-CM | POA: Diagnosis not present

## 2012-07-08 LAB — LIPID PANEL: VLDL: 187.4 mg/dL — ABNORMAL HIGH (ref 0.0–40.0)

## 2012-07-08 LAB — CBC WITH DIFFERENTIAL/PLATELET
Eosinophils Relative: 3.2 % (ref 0.0–5.0)
HCT: 40.4 % (ref 39.0–52.0)
Hemoglobin: 13.9 g/dL (ref 13.0–17.0)
Lymphs Abs: 1.8 10*3/uL (ref 0.7–4.0)
MCV: 81.6 fl (ref 78.0–100.0)
Monocytes Relative: 6.1 % (ref 3.0–12.0)
Neutro Abs: 4.6 10*3/uL (ref 1.4–7.7)
RDW: 13.2 % (ref 11.5–14.6)
WBC: 7 10*3/uL (ref 4.5–10.5)

## 2012-07-08 LAB — BASIC METABOLIC PANEL
Chloride: 100 mEq/L (ref 96–112)
GFR: 37.36 mL/min — ABNORMAL LOW (ref >60.00)
Glucose, Bld: 482 mg/dL — ABNORMAL HIGH (ref 70–99)
Potassium: 4.6 mEq/L (ref 3.5–5.1)
Sodium: 131 mEq/L — ABNORMAL LOW (ref 135–145)

## 2012-07-08 LAB — HEPATIC FUNCTION PANEL
AST: 33 U/L (ref 0–37)
Albumin: 3.7 g/dL (ref 3.5–5.2)
Alkaline Phosphatase: 87 U/L (ref 39–117)
Total Protein: 6.6 g/dL (ref 6.0–8.3)

## 2012-07-08 LAB — LDL CHOLESTEROL, DIRECT: Direct LDL: 49.8 mg/dL

## 2012-07-08 LAB — HEMOGLOBIN A1C: Hgb A1c MFr Bld: 13.6 % — ABNORMAL HIGH (ref 4.6–6.5)

## 2012-07-08 NOTE — Assessment & Plan Note (Signed)
Lab Results  Component Value Date   HGBA1C 9.0* 01/08/2012   Seems to be worse If no better, will refer for endocrinology

## 2012-07-08 NOTE — Assessment & Plan Note (Signed)
Will check PSA and stool immunoassay after discussion

## 2012-07-08 NOTE — Progress Notes (Signed)
  Subjective:    Patient ID: Aaron Mckenzie, male    DOB: 1960-04-01, 52 y.o.   MRN: [id number]  HPI Here for follow up Hasn't started at the gym Has retired now---- SSI disability (from the neuropathy) Weight is up 6#  Hasn't been checking sugars---has machine Has been compliant with meds No low sugar reactions UTD on eye exam  Ongoing neuropathy pain Night is better with increase to 3 gabapentin  Still on statin No muscle aching  Current Outpatient Prescriptions on File Prior to Visit  Medication Sig Dispense Refill  . aspirin EC 81 MG tablet Take 81 mg by mouth daily.      Marland Kitchen gabapentin (NEURONTIN) 300 MG capsule Take 2 capsules (600 mg total) by mouth 2 (two) times daily. And 3 capsules at bedtime  210 capsule  11  . ibuprofen (ADVIL,MOTRIN) 800 MG tablet TAKE ONE TABLET BY MOUTH THREE TIMES DAILY AS NEEDED FOR PSEUDOGOUT  100 tablet  5  . lisinopril (PRINIVIL,ZESTRIL) 20 MG tablet TAKE ONE TABLET BY MOUTH EVERY DAY  90 tablet  3  . metFORMIN (GLUCOPHAGE) 1000 MG tablet TAKE 1 TABLET (1,000 MG TOTAL) BY MOUTH 2 (TWO) TIMES DAILY WITH AMEAL.  180 tablet  0  . pravastatin (PRAVACHOL) 40 MG tablet TAKE ONE TABLET BY MOUTH EVERY DAY  90 tablet  3   No current facility-administered medications on file prior to visit.    No Known Allergies  Past Medical History  Diagnosis Date  . Diabetes mellitus   . Hypertension   . Pseudogout   . Ectodermal dysplasia   . Hyperlipidemia     Past Surgical History  Procedure Laterality Date  . Appendectomy    . Rib fracture surgery  1988    right    Family History  Problem Relation Age of Onset  . Arthritis Mother   . Arthritis Father     History   Social History  . Marital Status: Married    Spouse Name: N/A    Number of Children: N/A  . Years of Education: N/A   Occupational History  . 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 Smoking status: Never Smoker   .  Smokeless tobacco: Never Used  . Alcohol Use: No  . Drug Use: No  . Sexually Active: Not on file   Other Topics Concern  . Not on file   Social History Narrative       Review of Systems No stomach trouble Bowels are regular Does have occ red blood on toilet paper    Objective:   Physical Exam  Constitutional: He appears well-developed and well-nourished. No distress.  Neck: Normal range of motion. Neck supple. No thyromegaly present.  Cardiovascular: Normal rate, regular rhythm, normal heart sounds and intact distal pulses.  Exam reveals no gallop.   No murmur heard. Pulmonary/Chest: Effort normal and breath sounds normal. No respiratory distress. He has no wheezes. He has no rales.  Musculoskeletal: He exhibits edema. He exhibits no tenderness.  Mild edema in left ankle and foot only  Lymphadenopathy:    He has no cervical adenopathy.  Skin: No rash noted. No erythema.  Psychiatric: He has a normal mood and affect. His behavior is normal.          Assessment & Plan:

## 2012-07-08 NOTE — Assessment & Plan Note (Signed)
BP Readings from Last 3 Encounters:  07/08/12 110/70  01/08/12 110/70  06/21/11 138/70   Good control

## 2012-07-08 NOTE — Assessment & Plan Note (Signed)
No problems with meds Due for labs

## 2012-07-08 NOTE — Assessment & Plan Note (Signed)
Now with symptoms in hands Better at bedtime with the increased dose

## 2012-07-09 ENCOUNTER — Other Ambulatory Visit: Payer: Self-pay | Admitting: Internal Medicine

## 2012-07-09 DIAGNOSIS — E1149 Type 2 diabetes mellitus with other diabetic neurological complication: Secondary | ICD-10-CM

## 2012-07-23 ENCOUNTER — Other Ambulatory Visit (INDEPENDENT_AMBULATORY_CARE_PROVIDER_SITE_OTHER): Payer: Medicare Other

## 2012-07-23 DIAGNOSIS — Z1211 Encounter for screening for malignant neoplasm of colon: Secondary | ICD-10-CM | POA: Diagnosis not present

## 2012-07-23 LAB — FECAL OCCULT BLOOD, IMMUNOCHEMICAL: Fecal Occult Bld: NEGATIVE

## 2012-08-14 ENCOUNTER — Ambulatory Visit: Payer: Medicare Other | Admitting: Internal Medicine

## 2012-09-05 ENCOUNTER — Other Ambulatory Visit: Payer: Self-pay

## 2012-10-09 ENCOUNTER — Other Ambulatory Visit: Payer: Self-pay | Admitting: Internal Medicine

## 2012-12-17 ENCOUNTER — Other Ambulatory Visit: Payer: Self-pay | Admitting: Internal Medicine

## 2012-12-24 ENCOUNTER — Other Ambulatory Visit: Payer: Self-pay | Admitting: Internal Medicine

## 2013-01-02 ENCOUNTER — Other Ambulatory Visit: Payer: Self-pay

## 2013-01-08 ENCOUNTER — Ambulatory Visit (INDEPENDENT_AMBULATORY_CARE_PROVIDER_SITE_OTHER): Payer: Medicare Other | Admitting: Internal Medicine

## 2013-01-08 ENCOUNTER — Encounter: Payer: Self-pay | Admitting: Internal Medicine

## 2013-01-08 VITALS — BP 110/70 | HR 96 | Temp 97.9°F | Wt 229.0 lb

## 2013-01-08 DIAGNOSIS — E1142 Type 2 diabetes mellitus with diabetic polyneuropathy: Secondary | ICD-10-CM | POA: Diagnosis not present

## 2013-01-08 DIAGNOSIS — Z23 Encounter for immunization: Secondary | ICD-10-CM

## 2013-01-08 DIAGNOSIS — E1129 Type 2 diabetes mellitus with other diabetic kidney complication: Secondary | ICD-10-CM

## 2013-01-08 DIAGNOSIS — E1149 Type 2 diabetes mellitus with other diabetic neurological complication: Secondary | ICD-10-CM | POA: Diagnosis not present

## 2013-01-08 DIAGNOSIS — I10 Essential (primary) hypertension: Secondary | ICD-10-CM | POA: Diagnosis not present

## 2013-01-08 DIAGNOSIS — N183 Chronic kidney disease, stage 3 unspecified: Secondary | ICD-10-CM | POA: Diagnosis not present

## 2013-01-08 DIAGNOSIS — E1122 Type 2 diabetes mellitus with diabetic chronic kidney disease: Secondary | ICD-10-CM

## 2013-01-08 LAB — BASIC METABOLIC PANEL
BUN: 33 mg/dL — ABNORMAL HIGH (ref 6–23)
CO2: 20 mEq/L (ref 19–32)
Calcium: 9.9 mg/dL (ref 8.4–10.5)
Chloride: 102 mEq/L (ref 96–112)
Creatinine, Ser: 1.8 mg/dL — ABNORMAL HIGH (ref 0.4–1.5)
Sodium: 132 mEq/L — ABNORMAL LOW (ref 135–145)

## 2013-01-08 LAB — HM DIABETES FOOT EXAM

## 2013-01-08 LAB — HEMOGLOBIN A1C: Hgb A1c MFr Bld: 11.5 % — ABNORMAL HIGH (ref 4.6–6.5)

## 2013-01-08 MED ORDER — UREA 10 % EX CREA: TOPICAL_CREAM | Freq: Every day | CUTANEOUS | Status: DC | PRN

## 2013-01-08 NOTE — Assessment & Plan Note (Signed)
Has not really improved lifestyle Cancelled appt but will try rescheduling---needs sig additional Rx

## 2013-01-08 NOTE — Addendum Note (Signed)
Addended by: Ellamae Sia on: 01/08/2013 11:12 AM   Modules accepted: Orders

## 2013-01-08 NOTE — Assessment & Plan Note (Signed)
BP Readings from Last 3 Encounters:  01/08/13 110/70  07/08/12 110/70  01/08/12 110/70   Good control

## 2013-01-08 NOTE — Assessment & Plan Note (Signed)
Reasonable control with gabapentin but still with ongoing symptoms

## 2013-01-08 NOTE — Assessment & Plan Note (Signed)
On ACEI and BP good Will check PTH and start ergocalciferol if needed If worse, will refer to nephrology

## 2013-01-08 NOTE — Progress Notes (Signed)
Subjective:    Patient ID: Aaron Mckenzie, male    DOB: [birth year], 52 y.o.   MRN: [id number]  HPI "I could be better" Little things adding up Gout in hands----- swelling up and down (but may be the neuropathy pain) Feet still hurt--still uses gabapentin  Was set up with Dr Cruzita Lederer but he canceled the appointment Has not made any major changes in diet Weight is slightly down Has eye exam later in the month Not checking the sugars--he has the machine No hypoglycemic reactions  No chest pain No SOB Some edema in feet and ankles No dizziness or syncope  Current Outpatient Prescriptions on File Prior to Visit  Medication Sig Dispense Refill  . aspirin EC 81 MG tablet Take 81 mg by mouth daily.      Marland Kitchen gabapentin (NEURONTIN) 300 MG capsule TAKE 2 CAPSULES (600 MG TOTAL) BY MOUTH 2 (TWO) TIMES DAILY AND  3CAPSULES AT BEDTIME.  210 capsule  10  . glipiZIDE (GLUCOTROL) 5 MG tablet TAKE 1 TABLET (5 MG TOTAL) BY MOUTH 2 (TWO) TIMES DAILY  BEFORE AMEAL.  180 tablet  2  . ibuprofen (ADVIL,MOTRIN) 800 MG tablet TAKE ONE TABLET BY MOUTH THREE TIMES DAILY AS NEEDED FOR PSEUDOGOUT  100 tablet  5  . lisinopril (PRINIVIL,ZESTRIL) 20 MG tablet TAKE ONE TABLET BY MOUTH EVERY DAY  90 tablet  3  . metFORMIN (GLUCOPHAGE) 1000 MG tablet TAKE ONE TABLET BY MOUTH TWICE A DAY WITH MEALS.  180 tablet  0  . pravastatin (PRAVACHOL) 40 MG tablet TAKE ONE TABLET BY MOUTH EVERY DAY  90 tablet  3   No current facility-administered medications on file prior to visit.    No Known Allergies  Past Medical History  Diagnosis Date  . Diabetes mellitus   . Hypertension   . Pseudogout   . Ectodermal dysplasia   . Hyperlipidemia     Past Surgical History  Procedure Laterality Date  . Appendectomy    . Rib fracture surgery  1988    right    Family History  Problem Relation Age of Onset  . Arthritis Mother   . Arthritis Father     History   Social History  . Marital Status: Married    Spouse  Name: N/A    Number of Children: N/A  . Years of Education: N/A   Occupational History  . 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 Smoking status: Never Smoker   . Smokeless tobacco: Never Used  . Alcohol Use: No  . Drug Use: No  . Sexual Activity: Not on file   Other Topics Concern  . Not on file   Social History Narrative       Review of Systems Weight down 5# Voids okay Generally sleeps okay    Objective:   Physical Exam  Constitutional: He appears well-developed and well-nourished. No distress.  Neck: Normal range of motion. Neck supple.  Cardiovascular: Normal rate, regular rhythm, normal heart sounds and intact distal pulses.  Exam reveals no gallop.   No murmur heard. Pulmonary/Chest: Effort normal and breath sounds normal. No respiratory distress. He has no wheezes. He has no rales.  Abdominal: Soft. There is no tenderness.  Lymphadenopathy:    He has no cervical adenopathy.  Neurological:  Decreased sensation in feet  Skin:  Scaly areas on feet but no ulcers or lesions  Psychiatric: He has a normal mood and affect.  His behavior is normal.          Assessment & Plan:

## 2013-01-08 NOTE — Addendum Note (Signed)
Addended by: Despina Hidden on: 01/08/2013 04:07 PM   Modules accepted: Orders

## 2013-01-09 LAB — VITAMIN D 25 HYDROXY (VIT D DEFICIENCY, FRACTURES): Vit D, 25-Hydroxy: 35 ng/mL (ref 30–89)

## 2013-01-13 ENCOUNTER — Telehealth: Payer: Self-pay | Admitting: *Deleted

## 2013-01-13 NOTE — Telephone Encounter (Signed)
Message copied by Lucius Conn on Mon Jan 13, 2013 10:54 AM ------      Message from: Philemon Kingdom      Created: Sun Jan 12, 2013  8:59 AM       Larene Beach,      Can you have this pt scheduled ASAP per request from Dr Silvio Pate - I think the soonest available is 01/16/2013, so let's do that for now, but can you tell the schedulers to call him and schedule him sooner I have a cancellation?      (I wrote a message to [place] on Fri am but I still do not see him on the schedule and I am not sure if she did get the message or not.)      Thank you,      C ------

## 2013-01-13 NOTE — Telephone Encounter (Signed)
Called pt and advised him that we have an opening tomorrow and pt stated that was too early for him, he will keep his appt for Tues., Nov 25th at 4:15. Be advised.

## 2013-01-16 ENCOUNTER — Encounter: Payer: Self-pay | Admitting: Internal Medicine

## 2013-01-16 ENCOUNTER — Ambulatory Visit (INDEPENDENT_AMBULATORY_CARE_PROVIDER_SITE_OTHER): Payer: Medicare Other | Admitting: Internal Medicine

## 2013-01-16 VITALS — BP 122/76 | HR 83 | Temp 98.2°F | Resp 10 | Ht 69.0 in | Wt 226.6 lb

## 2013-01-16 DIAGNOSIS — E1149 Type 2 diabetes mellitus with other diabetic neurological complication: Secondary | ICD-10-CM

## 2013-01-16 MED ORDER — INSULIN PEN NEEDLE 31G X 5 MM MISC: Status: DC

## 2013-01-16 MED ORDER — INSULIN GLARGINE 100 UNIT/ML SOLOSTAR PEN: 20.0000 [IU] | PEN_INJECTOR | Freq: Every day | SUBCUTANEOUS | Status: DC

## 2013-01-16 NOTE — Patient Instructions (Addendum)
Please return in 1 month with your sugar log.  Start Lantus 20 units at night. I sent a prescription for pen needles and Lantus pens to your pharmacy. Please stop Metformin. Continue Glipizide 5 mg 2x a day. Please check sugars 2x a day, rotating checks.  PATIENT INSTRUCTIONS FOR TYPE 2 DIABETES:  DIET AND EXERCISE Diet and exercise is an important part of diabetic treatment.  We recommended aerobic exercise in the form of brisk walking (working between 40-60% of maximal aerobic capacity, similar to brisk walking) for 150 minutes per week (such as 30 minutes five days per week) along with 3 times per week performing 'resistance' training (using various gauge rubber tubes with handles) 5-10 exercises involving the major muscle groups (upper body, lower body and core) performing 10-15 repetitions (or near fatigue) each exercise. Start at half the above goal but build slowly to reach the above goals. If limited by weight, joint pain, or disability, we recommend daily walking in a swimming pool with water up to waist to reduce pressure from joints while allow for adequate exercise.    BLOOD GLUCOSES Monitoring your blood glucoses is important for continued management of your diabetes. Please check your blood glucoses 2-4 times a day: fasting, before meals and at bedtime (you can rotate these measurements - e.g. one day check before the 3 meals, the next day check before 2 of the meals and before bedtime, etc.   HYPOGLYCEMIA (low blood sugar) Hypoglycemia is usually a reaction to not eating, exercising, or taking too much insulin/ other diabetes drugs.  Symptoms include tremors, sweating, hunger, confusion, headache, etc. Treat IMMEDIATELY with 15 grams of Carbs:   4 glucose tablets    cup regular juice/soda   2 tablespoons raisins   4 teaspoons sugar   1 tablespoon honey Recheck blood glucose in 15 mins and repeat above if still symptomatic/blood glucose <100. Please contact our office at  [phone] if you have questions about how to next handle your insulin.  RECOMMENDATIONS TO REDUCE YOUR RISK OF DIABETIC COMPLICATIONS: * Take your prescribed MEDICATION(S). * Follow a DIABETIC diet: Complex carbs, fiber rich foods, heart healthy fish twice weekly, (monounsaturated and polyunsaturated) fats * AVOID saturated/trans fats, high fat foods, >2,300 mg salt per day. * EXERCISE at least 5 times a week for 30 minutes or preferably daily.  * DO NOT SMOKE OR DRINK more than 1 drink a day. * Check your FEET every day. Do not wear tightfitting shoes. Contact us if you develop an ulcer * See your EYE doctor once a year or more if needed * Get a FLU shot once a year * Get a PNEUMONIA vaccine once before and once after age 35 years  GOALS:  * Your Hemoglobin A1c of <7%  * fasting sugars need to be <130 * after meals sugars need to be <180 (2h after you start eating) * Your Systolic BP should be [id number] or lower  * Your Diastolic BP should be 80 or lower  * Your HDL (Good Cholesterol) should be 40 or higher  * Your LDL (Bad Cholesterol) should be 100 or lower  * Your Triglycerides should be 150 or lower  * Your Urine microalbumin (kidney function) should be <30 * Your Body Mass Index should be 25 or lower   We will be glad to help you achieve these goals. Our telephone number is: [phone].

## 2013-01-16 NOTE — Progress Notes (Signed)
Patient ID: Aaron Mckenzie, male   DOB: May 24, 1960, 52 y.o.   MRN: [id number]  HPI: XAIDYN Mckenzie is a 52 y.o.-year-old male, referred by his PCP, Dr. Silvio Pate, for management of DM2, non-insulin-dependent, uncontrolled, with complications (neuropathy).  Patient has been diagnosed with diabetes in 2004; he has not been on insulin before.  Last hemoglobin A1c was: Lab Results  Component Value Date   HGBA1C 11.5* 01/08/2013   HGBA1C 13.6* 07/08/2012   HGBA1C 9.0* 01/08/2012   Pt is on a regimen of: - Metformin 1000 mg po bid - Glipizide 5 mg bid  Pt does not check at home. He does have a meter, lancets and strips. Unclear if he has lows. ? lowest sugar; he has hypoglycemia awareness but unknown how low. When sugars very high >> pain in eyeballs (~ once a month).  Pt's meals are: - Breakfast: eggs, meat, grits, wheat toast - Lunch: may skip or eat late b'fast - Dinner: chicken/fish/pork chops, 2 sides, salad, baked potatoes - Snacks: 2 a day - apple slices, popcorn  - has CKD, last BUN/creatinine:  Lab Results  Component Value Date   BUN 33* 01/08/2013   CREATININE 1.8* 01/08/2013  On Lisinopril. - last set of lipids: Lab Results  Component Value Date   CHOL 197 07/08/2012   HDL 28.70* 07/08/2012   LDLDIRECT 49.8 07/08/2012   TRIG 937.0* 07/08/2012   CHOLHDL 7 07/08/2012  On Pravastatin. On ASA 81.  - last eye exam was in 05/2012. No DR. Will have a new exam 01/20/2013.  - + numbness and tingling in his feet and hands. On neurontin 600-600-900 mg.  I reviewed his chart and he also has a history of HTN, HL, pseudogout, bilateral carpal tunnel s/p sx in each hand: 1985 and 2000.  Pt has FH of DM in great aunt.   ROS: Constitutional: + both weight gain/loss, no fatigue, no subjective hyperthermia/hypothermia Eyes: no blurry vision, no xerophthalmia ENT: no sore throat, no nodules palpated in throat, no dysphagia/odynophagia, no hoarseness Cardiovascular: no  CP/SOB/palpitations/+ hand and leg swelling Respiratory: no cough/SOB Gastrointestinal: no N/V/D/C Musculoskeletal: no muscle/+ joint aches Skin: no rashes, + itching Neurological: no tremors/numbness/tingling/dizziness Psychiatric: no depression/anxiety + diff with erections  Past Medical History  Diagnosis Date  . Diabetes mellitus   . Hypertension   . Pseudogout   . Ectodermal dysplasia   . Hyperlipidemia    Past Surgical History  Procedure Laterality Date  . Appendectomy    . Rib fracture surgery  1988    right   History   Social History  . Marital Status: Married    Spouse Name: N/A    Number of Children: no   Occupational History  . 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 Smoking status: Never Smoker   . Smokeless tobacco: Never Used  . Alcohol Use: No  . Drug Use: No   Social History Narrative   Married: 0 children   Regular exercise: no   Caffeine use: sweet tea    Current Outpatient Prescriptions on File Prior to Visit  Medication Sig Dispense Refill  . aspirin EC 81 MG tablet Take 81 mg by mouth daily.      Marland Kitchen gabapentin (NEURONTIN) 300 MG capsule TAKE 2 CAPSULES (600 MG TOTAL) BY MOUTH 2 (TWO) TIMES DAILY AND  3CAPSULES AT BEDTIME.  210 capsule  10  . glipiZIDE (GLUCOTROL) 5 MG tablet TAKE 1  TABLET (5 MG TOTAL) BY MOUTH 2 (TWO) TIMES DAILY  BEFORE AMEAL.  180 tablet  2  . ibuprofen (ADVIL,MOTRIN) 800 MG tablet TAKE ONE TABLET BY MOUTH THREE TIMES DAILY AS NEEDED FOR PSEUDOGOUT  100 tablet  5  . lisinopril (PRINIVIL,ZESTRIL) 20 MG tablet TAKE ONE TABLET BY MOUTH EVERY DAY  90 tablet  3  . pravastatin (PRAVACHOL) 40 MG tablet TAKE ONE TABLET BY MOUTH EVERY DAY  90 tablet  3  . urea (CARMOL) 10 % cream Apply topically daily as needed. For feet  454 g  3   No current facility-administered medications on file prior to visit.   No Known Allergies Family History  Problem Relation Age of Onset  . Arthritis  Mother   . Arthritis Father      PE: BP 122/76  Pulse 83  Temp(Src) 98.2 F (36.8 C) (Oral)  Resp 10  Ht 5\' 9"  (1.753 m)  Wt 226 lb 9.6 oz (102.785 kg)  BMI 33.45 kg/m2  SpO2 97% Wt Readings from Last 3 Encounters:  01/16/13 226 lb 9.6 oz (102.785 kg)  01/08/13 229 lb (103.874 kg)  07/08/12 234 lb (106.142 kg)   Constitutional: overweight, in NAD Eyes: PERRLA, EOMI, no exophthalmos ENT: moist mucous membranes, no thyromegaly, no cervical lymphadenopathy Cardiovascular: RRR, No MRG, + bilateral hand and feet swelling Respiratory: CTA B Gastrointestinal: abdomen soft, NT, ND, BS+ Musculoskeletal: no deformities, strength intact in all 4 Skin: moist, warm, no rashes Neurological: no tremor with outstretched hands, DTR normal in all 4  ASSESSMENT: 1. DM2, non-insulin-dependent, uncontrolled, without complications  PLAN:  1. Patient with long-standing, recently more uncontrolled diabetes, on oral antidiabetic regimen, which became insufficient - We discussed about options for treatment, and I suggested to:  Patient Instructions  Please return in 1 month with your sugar log.  Start Lantus 20 units at night. I sent a prescription for pen needles and Lantus pens to your pharmacy. Please stop Metformin. Continue Glipizide 5 mg 2x a day. Please check sugars 2x a day, rotating checks.  - discussed and demonstrated proper injection techniques:  Preference for the abdominal sq tissue  Rotation of sites  Change needle for each injection  Keep needle in for 10 sec after last unit of insulin in - Strongly advised him to start checking sugars at different times of the day - check 2 times a day, rotating checks - given sugar log and advised how to fill it and to bring it at next appt  - given foot care handout and explained the principles  - given instructions for hypoglycemia management "15-15 rule"  - advised for yearly eye  - he is up to date - had the flu vaccine this  season - Return to clinic in 1 mo with sugar log

## 2013-01-16 NOTE — Progress Notes (Signed)
Patient ID: Aaron Mckenzie, male   DOB: [birth year], 52 y.o.   MRN: [id number]

## 2013-01-20 ENCOUNTER — Encounter: Payer: Self-pay | Admitting: Internal Medicine

## 2013-01-20 DIAGNOSIS — H40019 Open angle with borderline findings, low risk, unspecified eye: Secondary | ICD-10-CM | POA: Diagnosis not present

## 2013-01-21 ENCOUNTER — Ambulatory Visit: Payer: Medicare Other | Admitting: Internal Medicine

## 2013-01-27 ENCOUNTER — Telehealth: Payer: Self-pay

## 2013-01-27 MED ORDER — GLUCOSE BLOOD VI STRP: ORAL_STRIP | Status: DC

## 2013-01-27 NOTE — Telephone Encounter (Signed)
rx sent to pharmacy by e-script  

## 2013-01-27 NOTE — Telephone Encounter (Signed)
Okay to send rx for 4 times per day He is on insulin

## 2013-01-27 NOTE — Telephone Encounter (Signed)
Pt has been paying out of pocket for diabetic test strips and pt request rx for contour next test strips sent to [facility] so insurance will pay for test strips. Pt said he checks BS 2-4 times daily.Please advise.

## 2013-02-14 ENCOUNTER — Ambulatory Visit (INDEPENDENT_AMBULATORY_CARE_PROVIDER_SITE_OTHER): Payer: Medicare Other | Admitting: Internal Medicine

## 2013-02-14 ENCOUNTER — Encounter: Payer: Self-pay | Admitting: Internal Medicine

## 2013-02-14 VITALS — BP 118/62 | HR 95 | Temp 98.0°F | Resp 12 | Wt 226.9 lb

## 2013-02-14 DIAGNOSIS — E1149 Type 2 diabetes mellitus with other diabetic neurological complication: Secondary | ICD-10-CM

## 2013-02-14 MED ORDER — INSULIN ASPART 100 UNIT/ML FLEXPEN: PEN_INJECTOR | SUBCUTANEOUS | Status: DC

## 2013-02-14 NOTE — Patient Instructions (Signed)
Please increase Lantus to 25 units. Add NovoLog 12 units before brunch and dinner. Add 4 units with a snack if it contains >18 g carbs (subtract fiber content from the total nr of carbs). Inject NovoLog 15 min before you eat. Please send me a message through MyChart in 1 week to let mw know how you are doing >> please list the sugars depending on the time of the check (e.g. BB 150, AB 170 = before breakfast, after breakfast), etc. Please return in 1 month with your sugar log.

## 2013-02-14 NOTE — Progress Notes (Signed)
Patient ID: Aaron Mckenzie, male   DOB: 1961/01/18, 52 y.o.   MRN: [id number]  HPI: Aaron Mckenzie is a 52 y.o.-year-old male, returning for f/u for DM2, dx 2004, insulin-dependent since 12/2012, uncontrolled, with complications (neuropathy).  Last hemoglobin A1c was: Lab Results  Component Value Date   HGBA1C 11.5* 01/08/2013   HGBA1C 13.6* 07/08/2012   HGBA1C 9.0* 01/08/2012   Pt is on a regimen of: We had to stop Metformin at last visit 2/2 decreased kidney fxn. - Lantus 20 hs - Glipizide 5 mg bid  Pt checks sugars 1-3x a day - brings log: - am: 274-445 - 2h after lunch: 400, 536 - before dinner: 153, 246, 352, 361 - 2h after dinner: 300s - bedtime: 322-534  No lows. ? lowest sugar; he has hypoglycemia awareness but unknown how low. When sugars very high >> pain in eyeballs.  Pt's meals are: - Breakfast: eggs, meat, grits, wheat toast - Lunch: may skip or eat late b'fast - Dinner: chicken/fish/pork chops, 2 sides, salad, baked potatoes - Snacks: 2 a day - apple slices, popcorn  - has CKD, last BUN/creatinine:  Lab Results  Component Value Date   BUN 33* 01/08/2013   CREATININE 1.8* 01/08/2013  On Lisinopril. - last set of lipids: Lab Results  Component Value Date   CHOL 197 07/08/2012   HDL 28.70* 07/08/2012   LDLDIRECT 49.8 07/08/2012   TRIG 937.0* 07/08/2012   CHOLHDL 7 07/08/2012  On Pravastatin. On ASA 81.  - last eye exam was in 01/20/2013. No DR reportedly. - + numbness and tingling in his feet and hands. On neurontin 600-600-900 mg.  He also has a history of HTN, HL, pseudogout, bilateral carpal tunnel s/p sx in each hand: 1985 and 2000.  I reviewed pt's medications, allergies, PMH, social hx, family hx and no changes required, except as mentioned above.  ROS: Constitutional: no weight gain/loss, no fatigue, no subjective hyperthermia/hypothermia Eyes: no blurry vision, no xerophthalmia ENT: no sore throat, no nodules palpated in throat, no  dysphagia/odynophagia, no hoarseness Cardiovascular: no CP/SOB/palpitations/+ hand and leg swelling Respiratory: no cough/SOB Gastrointestinal: no N/V/D/C Musculoskeletal: no muscle/joint aches Skin: no rashes, Neurological: no tremors/numbness/tingling/dizziness  PE: BP 118/62  Pulse 95  Temp(Src) 98 F (36.7 C) (Oral)  Resp 12  Wt 226 lb 14.4 oz (102.921 kg)  SpO2 96% Wt Readings from Last 3 Encounters:  02/14/13 226 lb 14.4 oz (102.921 kg)  01/16/13 226 lb 9.6 oz (102.785 kg)  01/08/13 229 lb (103.874 kg)   Constitutional: overweight, in NAD Eyes: PERRLA, EOMI, no exophthalmos ENT: moist mucous membranes, no thyromegaly, no cervical lymphadenopathy Cardiovascular: RRR, No MRG, + bilateral hand and feet swelling Respiratory: CTA B Gastrointestinal: abdomen soft, NT, ND, BS+ Musculoskeletal: no deformities, strength intact in all 4 Skin: moist, warm, no rashes  ASSESSMENT: 1. DM2, insulin-dependent, uncontrolled, with complications - PN - ? If chronic KD  PLAN:  1. Patient with long-standing, uncontrolled diabetes, recently started on insulin, with persistent very high sugars - He is very glucotoxic and, for now, I suggested to:  Patient Instructions  Please increase Lantus to 25 units. Add NovoLog 12 units before brunch and dinner. Add 4 units with a snack if it contains >18 g carbs (subtract fiber content from the total nr of carbs). Inject NovoLog 15 min before you eat. Please send me a message through MyChart in 1 week to let mw know how you are doing >> please list the sugars depending on the time  of the check (e.g. BB 150, AB 170 = before breakfast, after breakfast), etc. Please return in 1 month with your sugar log.  - Strongly advised him to start checking sugars at different times of the day - check 3 times a day, rotating checks - given more sugar logs  - he is up to date with yearly eye exams - had the flu vaccine this season - Return to clinic in 1 mo  with sugar log

## 2013-02-19 ENCOUNTER — Ambulatory Visit: Payer: Medicare Other | Admitting: Internal Medicine

## 2013-02-23 ENCOUNTER — Encounter: Payer: Self-pay | Admitting: Internal Medicine

## 2013-02-25 ENCOUNTER — Other Ambulatory Visit: Payer: Self-pay | Admitting: Internal Medicine

## 2013-03-12 ENCOUNTER — Ambulatory Visit: Payer: Medicare Other | Admitting: Internal Medicine

## 2013-03-13 ENCOUNTER — Encounter: Payer: Self-pay | Admitting: Internal Medicine

## 2013-03-13 ENCOUNTER — Ambulatory Visit (INDEPENDENT_AMBULATORY_CARE_PROVIDER_SITE_OTHER): Payer: Medicare Other | Admitting: Internal Medicine

## 2013-03-13 VITALS — BP 140/80 | HR 86 | Temp 98.2°F | Wt 230.0 lb

## 2013-03-13 DIAGNOSIS — B9689 Other specified bacterial agents as the cause of diseases classified elsewhere: Secondary | ICD-10-CM

## 2013-03-13 DIAGNOSIS — M7020 Olecranon bursitis, unspecified elbow: Secondary | ICD-10-CM | POA: Diagnosis not present

## 2013-03-13 DIAGNOSIS — M71122 Other infective bursitis, left elbow: Secondary | ICD-10-CM

## 2013-03-13 DIAGNOSIS — M1189 Other specified crystal arthropathies, multiple sites: Secondary | ICD-10-CM | POA: Insufficient documentation

## 2013-03-13 DIAGNOSIS — A499 Bacterial infection, unspecified: Secondary | ICD-10-CM | POA: Diagnosis not present

## 2013-03-13 DIAGNOSIS — M1120 Other chondrocalcinosis, unspecified site: Secondary | ICD-10-CM | POA: Diagnosis not present

## 2013-03-13 MED ORDER — COLCHICINE 0.6 MG PO TABS: 0.6000 mg | ORAL_TABLET | Freq: Two times a day (BID) | ORAL | Status: DC | PRN

## 2013-03-13 MED ORDER — AMOXICILLIN-POT CLAVULANATE 875-125 MG PO TABS: 1.0000 | ORAL_TABLET | Freq: Two times a day (BID) | ORAL | Status: DC

## 2013-03-13 NOTE — Assessment & Plan Note (Signed)
Ibuprofen is not helping now Will try colchicine---if doesn't help will need to reluctantly give prednisone

## 2013-03-13 NOTE — Assessment & Plan Note (Signed)
Seems likely to have been just the pseudogout but then he opened it and now clear bacterial infection Will treat with augmentin He is still expressing pus and I told him drainage is good (heat)

## 2013-03-13 NOTE — Patient Instructions (Addendum)
Please try the colchicine-- 1 tab twice a day. If your hand is not better by Monday, we will need to try prednisone (please call me). If it is better, use it twice a day for about a week, then once a day for a week--then you can stop it (and keep it around in case you ever need it again). Let me know if the redness and pain in your elbow aren't better by Monday also.

## 2013-03-13 NOTE — Progress Notes (Signed)
   Subjective:    Patient ID: Aaron Mckenzie, male    DOB: 03-25-1960, 53 y.o.   MRN: [id number]  HPI Having swelling in left hand Feels a little better in the morning --then worsen as day goes on Then hard to even move fingers  Ibuprofen not working Goes back about 3 weeks  Eating better Diabetes is better  Current Outpatient Prescriptions on File Prior to Visit  Medication Sig Dispense Refill  . aspirin EC 81 MG tablet Take 81 mg by mouth daily.      Marland Kitchen gabapentin (NEURONTIN) 300 MG capsule TAKE 2 CAPSULES (600 MG TOTAL) BY MOUTH 2 (TWO) TIMES DAILY AND  3CAPSULES AT BEDTIME.  210 capsule  10  . glucose blood (BAYER CONTOUR NEXT TEST) test strip Pt checking blood sugar 2-4 times daily. Dx 250.62  400 each  3  . ibuprofen (ADVIL,MOTRIN) 800 MG tablet TAKE ONE TABLET BY MOUTH THREE TIMES DAILY AS NEEDED FOR PSEUDOGOUT  100 tablet  4  . insulin aspart (NOVOLOG FLEXPEN) 100 UNIT/ML SOPN FlexPen Inject up to 30 units a day as advised  2 pen  11  . Insulin Glargine (LANTUS SOLOSTAR) 100 UNIT/ML SOPN Inject 20 Units into the skin at bedtime.  5 pen  5  . Insulin Pen Needle (FIFTY50 PEN NEEDLES) 31G X 5 MM MISC Use once a day  100 each  5  . lisinopril (PRINIVIL,ZESTRIL) 20 MG tablet TAKE ONE TABLET BY MOUTH EVERY DAY  90 tablet  3  . pravastatin (PRAVACHOL) 40 MG tablet TAKE ONE TABLET BY MOUTH EVERY DAY  90 tablet  3  . urea (CARMOL) 10 % cream Apply topically daily as needed. For feet  454 g  3   No current facility-administered medications on file prior to visit.    No Known Allergies  Past Medical History  Diagnosis Date  . Diabetes mellitus   . Hypertension   . Pseudogout   . Ectodermal dysplasia   . Hyperlipidemia     Past Surgical History  Procedure Laterality Date  . Appendectomy    . Rib fracture surgery  1988    right    Family History  Problem Relation Age of Onset  . Arthritis Mother   . Arthritis Father     History   Social History  . Marital Status:  Married    Spouse Name: N/A    Number of Children: N/A  . Years of Education: N/A   Occupational History  . Retired after disability     Bussed tables at [employer] Topics  . Smoking status: Never Smoker   . Smokeless tobacco: Never Used  . Alcohol Use: No  . Drug Use: No  . Sexual Activity: Not on file   Other Topics Concern  . Not on file   Social History Narrative   Married: 0 children   Regular exercise: no   Caffeine use: sweet tea    Review of Systems Had fluid on left elbow-he then popped it and expressed it Still oozing    Objective:   Physical Exam  Musculoskeletal:  Swelling of olecranon bursa on left with holes and purulent drainage. Red warm and tender  Left hand is swollen Some pain in wrist 2nd-5th MCP are all severely swollen and tender ?mild synovitis in PIP joints also          Assessment & Plan:

## 2013-04-16 ENCOUNTER — Ambulatory Visit: Payer: Medicare Other | Admitting: Internal Medicine

## 2013-05-14 ENCOUNTER — Other Ambulatory Visit: Payer: Self-pay | Admitting: *Deleted

## 2013-05-14 ENCOUNTER — Encounter: Payer: Self-pay | Admitting: Internal Medicine

## 2013-05-14 ENCOUNTER — Ambulatory Visit (INDEPENDENT_AMBULATORY_CARE_PROVIDER_SITE_OTHER): Payer: Medicare Other | Admitting: Internal Medicine

## 2013-05-14 VITALS — BP 118/74 | HR 92 | Temp 98.3°F | Resp 12 | Wt 229.0 lb

## 2013-05-14 DIAGNOSIS — E1149 Type 2 diabetes mellitus with other diabetic neurological complication: Secondary | ICD-10-CM

## 2013-05-14 MED ORDER — INSULIN PEN NEEDLE 31G X 5 MM MISC: Status: DC

## 2013-05-14 NOTE — Patient Instructions (Signed)
-   Please increase Lantus to 50 units.  - Increase NovoLog to 25 units before brunch and dinner.  - add 12 units with a snack if it contains >18 g carbs (subtract fiber content from the total nr of carbs).   Please try to include more fruit and vegetables in tour diet and cut down fat. Try to stay active. Call about your blood sugars in 2 weeks to see if we need to increase insulin further.  Please return in 1 month with your sugar log.

## 2013-05-14 NOTE — Progress Notes (Addendum)
Patient ID: Aaron Mckenzie, male   DOB: [birth year], 53 y.o.   MRN: [id number]  HPI: Aaron Mckenzie is a 53 y.o.-year-old male, returning for f/u for DM2, dx 2004, insulin-dependent since 12/2012, uncontrolled, with complications (neuropathy). Last visit 3 mo ago.  Last hemoglobin A1c was: Lab Results  Component Value Date   HGBA1C 11.5* 01/08/2013   HGBA1C 13.6* 07/08/2012   HGBA1C 9.0* 01/08/2012   Pt is on a regimen of: - Lantus 20 hs >> 25  - Novolog 12 units tid - NovoLog 4 units with snacks We stopped Glipizide 5 mg bid when we started mealtime insulin We had to stop Metformin at last visit 2/2 decreased kidney fxn.  Reviewed tel notes since last visit:  Message Body Dear Aaron Mckenzie,  - Please increase Lantus to 35 units.  - Increase NovoLog to 15 units before brunch and dinner.  - Add 6 units with a snack if it contains >18 g carbs (subtract fiber content from the total nr of carbs).  Please let me know about your sugars again by the end of the week.  Sincerely,  Philemon Kingdom MD    ----- Message -----  From: Neta Mends  Sent: 02/23/2013 11:06 PM EST  To: Philemon Kingdom, MD  Subject: Non-Urgent Medical Question   since [place].19 blood sugar ave 250 BB 350BD 390BT.     Pt checks sugars 2-3x a day - brings log - SUGARS ARE STILL VERY HIGH, with minimum improvement since last visit: - am: 274-445 >> 252-352 - before lunch: 260 - 2h after lunch: 400, 536 >> n/c - before dinner: 153, 246, 352, 361 >> 257-417 - 2h after dinner: 300s >> n/c - bedtime: 322-534 >> 252-420  No lows. ? lowest sugar; he has hypoglycemia awareness but unknown how low. When sugars very high >> pain in eyeballs.  Pt's meals are: - Breakfast: eggs, meat, grits, wheat toast - Lunch: may skip or eat late b'fast - Dinner: chicken/fish/pork chops, 2 sides, salad, baked potatoes - Snacks: 2 a day - apple slices, popcorn  - has CKD, last BUN/creatinine:  Lab Results   Component Value Date   BUN 33* 01/08/2013   CREATININE 1.8* 01/08/2013  On Lisinopril. - last set of lipids: Lab Results  Component Value Date   CHOL 197 07/08/2012   HDL 28.70* 07/08/2012   LDLDIRECT 49.8 07/08/2012   TRIG 937.0* 07/08/2012   CHOLHDL 7 07/08/2012  On Pravastatin. On ASA 81.  - last eye exam was in 01/20/2013. No DR reportedly. - + numbness and tingling in his feet and hands. On neurontin 600-600-900 mg.  He also has a history of HTN, HL, pseudogout, bilateral carpal tunnel s/p sx in each hand: 1985 and 2000.  I reviewed pt's medications, allergies, PMH, social hx, family hx and no changes required, except as mentioned above.  ROS: Constitutional: no weight gain/loss, + fatigue, no subjective hyperthermia/hypothermia, + poor sleep Eyes: no blurry vision, no xerophthalmia ENT: no sore throat, no nodules palpated in throat, no dysphagia/odynophagia, no hoarseness Cardiovascular: no CP/SOB/palpitations/+ hand and leg swelling Respiratory: no cough/SOB Gastrointestinal: no N/V/D/C Musculoskeletal: no muscle/+ joint aches Skin: no rashes, Neurological: no tremors/numbness/tingling/dizziness + diff with erections  PE: BP 118/74  Pulse 92  Temp(Src) 98.3 F (36.8 C) (Oral)  Resp 12  Wt 229 lb (103.874 kg)  SpO2 97% Wt Readings from Last 3 Encounters:  05/14/13 229 lb (103.874 kg)  03/13/13 230 lb (104.327 kg)  02/14/13 226 lb 14.4 oz (  102.921 kg)   Constitutional: overweight, in NAD Eyes: PERRLA, EOMI, no exophthalmos ENT: moist mucous membranes, no thyromegaly, no cervical lymphadenopathy Cardiovascular: RRR, No MRG, + bilateral hand and feet swelling Respiratory: CTA B Gastrointestinal: abdomen soft, NT, ND, BS+ Musculoskeletal: no deformities, strength intact in all 4 Skin: moist, warm, no rashes  ASSESSMENT: 1. DM2, insulin-dependent, uncontrolled, with complications - PN - ? If chronic KD  PLAN:  1. Patient with long-standing, uncontrolled  diabetes, recently started on insulin, with persistent very high sugars - He is still very glucotoxic. I am not sure why he had so little response to insulin... He does not appear to have an active infection or any suggestion of heart disease. His diet is not optimum, but does not completely account for the high sugars. - I suggested to:  Patient Instructions  - Please increase Lantus to 50 units.  - Increase NovoLog to 25 units before brunch and dinner.  - add 12 units with a snack if it contains >18 g carbs (subtract fiber content from the total nr of carbs).  Please try to include more fruit and vegetables in tour diet and cut down fat. Try to stay active. Call about your blood sugars in 2 weeks to see if we need to increase insulin further. Please return in 1 month with your sugar log.  - continue checking sugars at different times of the day - check 3 times a day, rotating checks - he is up to date with yearly eye exams - had the flu vaccine this season - Return to clinic in 1 mo with sugar log   Lab Results  Component Value Date   HGBA1C 10.3* 05/16/2013   The HbA1c is a little better but still high.Marland KitchenMarland Kitchen

## 2013-05-16 ENCOUNTER — Other Ambulatory Visit (INDEPENDENT_AMBULATORY_CARE_PROVIDER_SITE_OTHER): Payer: Medicare Other

## 2013-05-16 DIAGNOSIS — E1149 Type 2 diabetes mellitus with other diabetic neurological complication: Secondary | ICD-10-CM

## 2013-05-17 LAB — HEMOGLOBIN A1C
HEMOGLOBIN A1C: 10.3 % — AB (ref <5.7)
Mean Plasma Glucose: 249 mg/dL — ABNORMAL HIGH (ref <117)

## 2013-06-04 ENCOUNTER — Other Ambulatory Visit: Payer: Self-pay | Admitting: *Deleted

## 2013-06-04 ENCOUNTER — Telehealth: Payer: Self-pay | Admitting: Internal Medicine

## 2013-06-04 MED ORDER — INSULIN GLARGINE 100 UNIT/ML SOLOSTAR PEN: PEN_INJECTOR | SUBCUTANEOUS | Status: DC

## 2013-06-04 NOTE — Telephone Encounter (Signed)
rx has been sent 

## 2013-06-04 NOTE — Telephone Encounter (Signed)
Pt requesting a lantus script change from 20 u to 50 u so it can be filled and paid by insurance to [facility]

## 2013-06-05 ENCOUNTER — Other Ambulatory Visit: Payer: Self-pay | Admitting: *Deleted

## 2013-06-05 MED ORDER — INSULIN GLARGINE 100 UNIT/ML SOLOSTAR PEN: PEN_INJECTOR | SUBCUTANEOUS | Status: DC

## 2013-06-11 ENCOUNTER — Encounter: Payer: Self-pay | Admitting: Internal Medicine

## 2013-06-11 ENCOUNTER — Ambulatory Visit (INDEPENDENT_AMBULATORY_CARE_PROVIDER_SITE_OTHER): Payer: Medicare Other | Admitting: Internal Medicine

## 2013-06-11 VITALS — BP 120/62 | HR 85 | Temp 98.0°F | Resp 12 | Wt 234.1 lb

## 2013-06-11 DIAGNOSIS — E1149 Type 2 diabetes mellitus with other diabetic neurological complication: Secondary | ICD-10-CM

## 2013-06-11 MED ORDER — INSULIN ASPART 100 UNIT/ML FLEXPEN: PEN_INJECTOR | SUBCUTANEOUS | Status: DC

## 2013-06-11 MED ORDER — INSULIN GLARGINE 100 UNIT/ML SOLOSTAR PEN: PEN_INJECTOR | SUBCUTANEOUS | Status: DC

## 2013-06-11 NOTE — Patient Instructions (Addendum)
Please adjust the insulin regimen as follows: - increase Lantus to 60 units at bedtime - increase Novolog to 30 units 3x a day  - try to inject 12 units with a snack if it contains >18 g carbs   Please return in 1.5 month with your sugar log.

## 2013-06-11 NOTE — Progress Notes (Signed)
Patient ID: Aaron Mckenzie, male   DOB: [birth year], 53 y.o.   MRN: [id number]  HPI: Aaron Mckenzie is a 53 y.o.-year-old male, returning for f/u for DM2, dx 2004, insulin-dependent since 12/2012, uncontrolled, with complications (neuropathy). Last visit 1 Mckenzie ago.  Last hemoglobin A1c was: Lab Results  Component Value Date   HGBA1C 10.3* 05/16/2013   HGBA1C 11.5* 01/08/2013   HGBA1C 13.6* 07/08/2012   Pt is on a regimen of: - Lantus 20 >> 25 >> 35 >> 50 units at bedtime - Novolog 12 >> 15 >> 25 units tid - NovoLog 4 >> 6 >> 12 units with snack if it contains >18 g carbs (subtract fiber content from the total nr of carbs), but not taking We stopped Glipizide 5 mg bid when we started mealtime insulin We had to stop Metformin at last visit 2/2 decreased kidney fxn.  Pt checks sugars 2-3x a day - brings log - sugars still high, but improved since last visit: - am: 274-445 >> 252-352 >> 187-250 - before lunch: 260 >> n/c - 2h after lunch: 400, 536 >> n/c >> n/c - before dinner: 153, 246, 352, 361 >> 257-417 >> 149-307, most low 200s - 2h after dinner: 300s >> n/c  - bedtime: 322-534 >> 252-420 >> 178-280, most low 200s  No lows. ? lowest sugar; he has hypoglycemia awareness but unknown how low. When sugars very high >> pain in eyeballs.  Pt's meals are: - Breakfast: eggs, meat, grits, wheat toast - Lunch: may skip or eat late b'fast - Dinner: chicken/fish/pork chops, 2 sides, salad, baked potatoes - Snacks: 2 a day - apple slices, popcorn  - has CKD, last BUN/creatinine:  Lab Results  Component Value Date   BUN 33* 01/08/2013   CREATININE 1.8* 01/08/2013  On Lisinopril. - last set of lipids: Lab Results  Component Value Date   CHOL 197 07/08/2012   HDL 28.70* 07/08/2012   LDLDIRECT 49.8 07/08/2012   TRIG 937.0* 07/08/2012   CHOLHDL 7 07/08/2012  On Pravastatin. On ASA 81.  - last eye exam was in 01/20/2013. No DR reportedly. - + numbness and tingling in his feet and  hands. On neurontin 600-600-900 mg.  He also has a history of HTN, HL, pseudogout, bilateral carpal tunnel s/p sx in each hand: 1985 and 2000.  I reviewed pt's medications, allergies, PMH, social hx, family hx and no changes required, except as mentioned above.  ROS: Constitutional: no weight gain/loss, no fatigue, no subjective hyperthermia/hypothermia, + poor sleep Eyes: no blurry vision, no xerophthalmia ENT: no sore throat, no nodules palpated in throat, no dysphagia/odynophagia, no hoarseness Cardiovascular: no CP/SOB/palpitations/+ hand and leg swelling Respiratory: no cough/SOB Gastrointestinal: no N/V/D/C Musculoskeletal: no muscle/+ joint aches Skin: no rashes, Neurological: no tremors/numbness/tingling/dizziness + diff with erections  PE: BP 120/62  Pulse 85  Temp(Src) 98 F (36.7 C) (Oral)  Resp 12  Wt 234 lb 1.9 oz (106.196 kg)  SpO2 97% Wt Readings from Last 3 Encounters:  06/11/13 234 lb 1.9 oz (106.196 kg)  05/14/13 229 lb (103.874 kg)  03/13/13 230 lb (104.327 kg)   Constitutional: overweight, in NAD Eyes: PERRLA, EOMI, no exophthalmos ENT: moist mucous membranes, no thyromegaly, no cervical lymphadenopathy Cardiovascular: RRR, No MRG, + bilateral hand and feet swelling Respiratory: CTA B Gastrointestinal: abdomen soft, NT, ND, BS+ Musculoskeletal: no deformities, strength intact in all 4 Skin: moist, warm, no rashes  ASSESSMENT: 1. DM2, insulin-dependent, uncontrolled, with complications - PN - ? If chronic KD  PLAN:  1. Patient with long-standing, uncontrolled diabetes, recently started on insulin, with persistent very high sugars - He is still very glucotoxic, but sugars improved after we greatly increased the insulin dose at last visit.  - I suggested to:    Patient Instructions  Please adjust the insulin regimen as follows: - increase Lantus to 60 units at bedtime - increase Novolog to 30 units 3x a day  - try to inject 12 units with a snack  if it contains >18 g carbs (currently not using this) Please return in 1.5 month with your sugar log.  - continue checking sugars at different times of the day - check 3 times a day, rotating checks - he is up to date with yearly eye exams

## 2013-07-09 ENCOUNTER — Encounter: Payer: Self-pay | Admitting: Internal Medicine

## 2013-07-09 ENCOUNTER — Ambulatory Visit (INDEPENDENT_AMBULATORY_CARE_PROVIDER_SITE_OTHER): Payer: Medicare Other | Admitting: Internal Medicine

## 2013-07-09 VITALS — BP 110/70 | HR 86 | Temp 97.7°F | Wt 240.0 lb

## 2013-07-09 DIAGNOSIS — M1189 Other specified crystal arthropathies, multiple sites: Secondary | ICD-10-CM

## 2013-07-09 DIAGNOSIS — N183 Chronic kidney disease, stage 3 unspecified: Secondary | ICD-10-CM | POA: Diagnosis not present

## 2013-07-09 DIAGNOSIS — E1122 Type 2 diabetes mellitus with diabetic chronic kidney disease: Secondary | ICD-10-CM

## 2013-07-09 DIAGNOSIS — M1120 Other chondrocalcinosis, unspecified site: Secondary | ICD-10-CM | POA: Diagnosis not present

## 2013-07-09 DIAGNOSIS — E1129 Type 2 diabetes mellitus with other diabetic kidney complication: Secondary | ICD-10-CM | POA: Diagnosis not present

## 2013-07-09 NOTE — Progress Notes (Signed)
   Subjective:    Patient ID: Aaron Mckenzie, male    DOB: March 14, 1960, 53 y.o.   MRN: [id number]  HPI Elbow did heal up  Still with pain in hands---tend to get stiff as the day goes on Better in the morning Doesn't think he is taking the colchicine Fairly regular with the NSAIDs  Has been following with Dr Cruzita Lederer Sugars are some better  Discussed the renal problems  Current Outpatient Prescriptions on File Prior to Visit  Medication Sig Dispense Refill  . aspirin EC 81 MG tablet Take 81 mg by mouth daily.      . colchicine 0.6 MG tablet Take 1 tablet (0.6 mg total) by mouth 2 (two) times daily as needed.  60 tablet  1  . gabapentin (NEURONTIN) 300 MG capsule Take 300 mg by mouth 5 (five) times daily.       Marland Kitchen glucose blood (BAYER CONTOUR NEXT TEST) test strip Pt checking blood sugar 2-4 times daily. Dx 250.62  400 each  3  . ibuprofen (ADVIL,MOTRIN) 800 MG tablet TAKE ONE TABLET BY MOUTH THREE TIMES DAILY AS NEEDED FOR PSEUDOGOUT  100 tablet  4  . insulin aspart (NOVOLOG FLEXPEN) 100 UNIT/ML FlexPen Inject up to 90 units a day as advised  30 pen  3  . Insulin Glargine (LANTUS SOLOSTAR) 100 UNIT/ML Solostar Pen Inject 60 units at bedtime daily  30 pen  3  . Insulin Pen Needle (FIFTY50 PEN NEEDLES) 31G X 5 MM MISC Use to inject insulin 3 times a day as instructed  100 each  5  . lisinopril (PRINIVIL,ZESTRIL) 20 MG tablet TAKE ONE TABLET BY MOUTH EVERY DAY  90 tablet  3  . pravastatin (PRAVACHOL) 40 MG tablet TAKE ONE TABLET BY MOUTH EVERY DAY  90 tablet  3  . urea (CARMOL) 10 % cream Apply topically daily as needed. For feet  454 g  3   No current facility-administered medications on file prior to visit.    No Known Allergies  Past Medical History  Diagnosis Date  . Diabetes mellitus   . Hypertension   . Pseudogout   . Ectodermal dysplasia   . Hyperlipidemia     Past Surgical History  Procedure Laterality Date  . Appendectomy    . Rib fracture surgery  1988    right      Family History  Problem Relation Age of Onset  . Arthritis Mother   . Arthritis Father      Review of Systems Sleeps okay Weight is up a few pounds    Objective:   Physical Exam  Musculoskeletal:  Thickening in olecranon bursa  R>>L Mild thickening without inflammation in hands (IP joints)          Assessment & Plan:

## 2013-07-09 NOTE — Patient Instructions (Signed)
Please take the colchicine up to twice a day for the swelling and pain in hands. Try to limit use of any advil, aleve, motrin.

## 2013-07-09 NOTE — Assessment & Plan Note (Signed)
Will refer to nephrology for initial eval--though seems mostly stable for at least 2 years

## 2013-07-09 NOTE — Assessment & Plan Note (Signed)
Asked him to use the colchicine--not the NSAIDS, given his renal impairment

## 2013-07-23 ENCOUNTER — Encounter: Payer: Self-pay | Admitting: Internal Medicine

## 2013-07-23 ENCOUNTER — Ambulatory Visit (INDEPENDENT_AMBULATORY_CARE_PROVIDER_SITE_OTHER): Payer: Medicare Other | Admitting: Internal Medicine

## 2013-07-23 VITALS — BP 122/78 | HR 82 | Temp 97.8°F | Resp 12 | Wt 234.0 lb

## 2013-07-23 DIAGNOSIS — E1149 Type 2 diabetes mellitus with other diabetic neurological complication: Secondary | ICD-10-CM

## 2013-07-23 MED ORDER — INSULIN GLARGINE 100 UNIT/ML SOLOSTAR PEN: PEN_INJECTOR | SUBCUTANEOUS | Status: DC

## 2013-07-23 NOTE — Patient Instructions (Signed)
-   Please increase Lantus to 70 units at bedtime - Continue Novolog 30 units 2x a day with your main meal - If you eat a snack with >15 fg carbs >> inject 10 units of NovoLog  Please return in 1.5 month with your sugar log.

## 2013-07-23 NOTE — Progress Notes (Signed)
Patient ID: Aaron Mckenzie, male   DOB: 19-Nov-1960, 53 y.o.   MRN: [id number]  HPI: Aaron Mckenzie is a 53 y.o.-year-old male, returning for f/u for DM2, dx 2004, insulin-dependent since 12/2012, uncontrolled, with complications (neuropathy). Last visit 1 mo ago.  Last hemoglobin A1c was: Lab Results  Component Value Date   HGBA1C 10.3* 05/16/2013   HGBA1C 11.5* 01/08/2013   HGBA1C 13.6* 07/08/2012   Pt is on a regimen of: - Lantus 60 units at bedtime - Novolog 30 units bid (does not eat lunch) - NovoLog 12 units with snack if it contains >18 g carbs (subtract fiber content from the total nr of carbs), but not taking We stopped Glipizide 5 mg bid when we started mealtime insulin We had to stop Metformin at last visit 2/2 decreased kidney fxn.  Pt checks sugars 2-3x a day - brings log - sugars still high, but improved a little since last visit: - am: 274-445 >> 252-352 >> 187-250 >> 134-221 - before lunch: 260 >> n/c  - 2h after lunch: 400, 536 >> n/c >> n/c - before dinner: 153, 246, 352, 361 >> 257-417 >> 149-307, most low 200s >> 144-280 - 2h after dinner: 300s >> n/c  - bedtime: 322-534 >> 252-420 >> 178-280, most low 200s >> 124-280  No lows. ? lowest sugar; he has hypoglycemia awareness but unknown how low. Highest CBG 379 x 1 in last mo.  When sugars very high >> pain in eyeballs.  Pt's meals are: - Breakfast: eggs, meat, grits, wheat toast - Lunch: may skip or eat late b'fast  - Dinner: chicken/fish/pork chops, 2 sides, salad, baked potatoes - Snacks: 2 a day - apple slices, popcorn  - has CKD, last BUN/creatinine:  Lab Results  Component Value Date   BUN 33* 01/08/2013   CREATININE 1.8* 01/08/2013  On Lisinopril. - last set of lipids: Lab Results  Component Value Date   CHOL 197 07/08/2012   HDL 28.70* 07/08/2012   LDLDIRECT 49.8 07/08/2012   TRIG 937.0* 07/08/2012   CHOLHDL 7 07/08/2012  On Pravastatin. On ASA 81.  - last eye exam was in 01/20/2013. No DR  reportedly. - + numbness and tingling in his feet and hands. On neurontin 600-600-900 mg.  He also has a history of HTN, HL, pseudogout, bilateral carpal tunnel s/p sx in each hand: 1985 and 2000.  I reviewed pt's medications, allergies, PMH, social hx, family hx and no changes required, except as mentioned above.  ROS: Constitutional: no weight gain/loss, no fatigue, no subjective hyperthermia/hypothermia Eyes: no blurry vision, no xerophthalmia ENT: no sore throat, no nodules palpated in throat, no dysphagia/odynophagia, no hoarseness Cardiovascular: no CP/SOB/palpitations/hand and leg swelling Respiratory: no cough/SOB Gastrointestinal: no N/V/D/C Musculoskeletal: no muscle/joint aches Skin: no rashes, Neurological: no tremors/numbness/tingling/dizziness  PE: BP 122/78  Pulse 82  Temp(Src) 97.8 F (36.6 C) (Oral)  Resp 12  Wt 234 lb (106.142 kg)  SpO2 97% Wt Readings from Last 3 Encounters:  07/23/13 234 lb (106.142 kg)  07/09/13 240 lb (108.863 kg)  06/11/13 234 lb 1.9 oz (106.196 kg)   Constitutional: overweight, in NAD Eyes: PERRLA, EOMI, no exophthalmos ENT: moist mucous membranes, no thyromegaly, no cervical lymphadenopathy Cardiovascular: RRR, No MRG, + bilateral hand and feet swelling Respiratory: CTA B Gastrointestinal: abdomen soft, NT, ND, BS+ Musculoskeletal: no deformities, strength intact in all 4 Skin: moist, warm, no rashes  ASSESSMENT: 1. DM2, insulin-dependent, uncontrolled, with complications - PN - ? If chronic KD  PLAN:  1. Patient with long-standing, uncontrolled diabetes, with increased insulin resistance - He is still glucotoxic, but sugars improved with increasing insulin doses at last visit.  - I suggested to increase Lantus as follows to optimize his am sugars:  Patient Instructions  - Please increase Lantus to 70 units at bedtime - Continue Novolog 30 units 2x a day with your main meal - If you eat a snack with >15 fg carbs >> inject  10 units of NovoLog Please return in 1.5 month with your sugar log.  - continue checking sugars at different times of the day - check 3 times a day, rotating checks - he is up to date with yearly eye exams - will check HbA1c at next visit - given more sugar logs - RTC in 6 weeks

## 2013-08-07 DIAGNOSIS — D631 Anemia in chronic kidney disease: Secondary | ICD-10-CM | POA: Diagnosis not present

## 2013-08-07 DIAGNOSIS — E119 Type 2 diabetes mellitus without complications: Secondary | ICD-10-CM | POA: Diagnosis not present

## 2013-08-07 DIAGNOSIS — N039 Chronic nephritic syndrome with unspecified morphologic changes: Secondary | ICD-10-CM | POA: Diagnosis not present

## 2013-08-07 DIAGNOSIS — I10 Essential (primary) hypertension: Secondary | ICD-10-CM | POA: Diagnosis not present

## 2013-08-07 DIAGNOSIS — N183 Chronic kidney disease, stage 3 unspecified: Secondary | ICD-10-CM | POA: Diagnosis not present

## 2013-08-07 DIAGNOSIS — N2581 Secondary hyperparathyroidism of renal origin: Secondary | ICD-10-CM | POA: Diagnosis not present

## 2013-08-14 ENCOUNTER — Other Ambulatory Visit: Payer: Self-pay | Admitting: Internal Medicine

## 2013-08-28 DIAGNOSIS — N183 Chronic kidney disease, stage 3 unspecified: Secondary | ICD-10-CM | POA: Diagnosis not present

## 2013-09-01 DIAGNOSIS — N039 Chronic nephritic syndrome with unspecified morphologic changes: Secondary | ICD-10-CM | POA: Diagnosis not present

## 2013-09-01 DIAGNOSIS — N183 Chronic kidney disease, stage 3 unspecified: Secondary | ICD-10-CM | POA: Diagnosis not present

## 2013-09-01 DIAGNOSIS — N2581 Secondary hyperparathyroidism of renal origin: Secondary | ICD-10-CM | POA: Diagnosis not present

## 2013-09-01 DIAGNOSIS — E119 Type 2 diabetes mellitus without complications: Secondary | ICD-10-CM | POA: Diagnosis not present

## 2013-09-01 DIAGNOSIS — M109 Gout, unspecified: Secondary | ICD-10-CM | POA: Diagnosis not present

## 2013-09-01 DIAGNOSIS — I10 Essential (primary) hypertension: Secondary | ICD-10-CM | POA: Diagnosis not present

## 2013-09-01 DIAGNOSIS — D631 Anemia in chronic kidney disease: Secondary | ICD-10-CM | POA: Diagnosis not present

## 2013-09-03 ENCOUNTER — Ambulatory Visit (INDEPENDENT_AMBULATORY_CARE_PROVIDER_SITE_OTHER): Payer: Medicare Other | Admitting: Internal Medicine

## 2013-09-03 ENCOUNTER — Other Ambulatory Visit: Payer: Medicare Other

## 2013-09-03 ENCOUNTER — Encounter: Payer: Self-pay | Admitting: Internal Medicine

## 2013-09-03 VITALS — BP 108/64 | HR 93 | Temp 98.1°F | Resp 12 | Wt 237.0 lb

## 2013-09-03 DIAGNOSIS — E1149 Type 2 diabetes mellitus with other diabetic neurological complication: Secondary | ICD-10-CM

## 2013-09-03 LAB — HEMOGLOBIN A1C: Hgb A1c MFr Bld: 8.6 % — ABNORMAL HIGH (ref 4.6–6.5)

## 2013-09-03 NOTE — Patient Instructions (Signed)
Please increase the Lantus to 80 units at bedtime. Continue Novolog 30 units 2x a day Please start NovoLog 10 units with snack if it contains >15 g carbs (subtract fiber content from the total nr of carbs).  Please return in 1.5 month with your sugar log.   Please stop at the lab.

## 2013-09-03 NOTE — Progress Notes (Signed)
Patient ID: Aaron Mckenzie, male   DOB: 05/16/1960, 53 y.o.   MRN: [id number]  HPI: AVION PAVIS is a 53 y.o.-year-old male, returning for f/u for DM2, dx 2004, insulin-dependent since 12/2012, uncontrolled, with complications (neuropathy). Last visit 1.5 mo ago.  Last hemoglobin A1c was: Lab Results  Component Value Date   HGBA1C 10.3* 05/16/2013   HGBA1C 11.5* 01/08/2013   HGBA1C 13.6* 07/08/2012   Pt is on a regimen of: - Lantus 60 >> 70 units at bedtime - Novolog 30 units bid (does not eat lunch) - NovoLog 10 units with snack if it contains >15 g carbs (subtract fiber content from the total nr of carbs), but not taking We stopped Glipizide 5 mg bid when we started mealtime insulin We had to stop Metformin at last visit 2/2 decreased kidney fxn.  Pt checks sugars 2-3x a day - brings log - sugars still high, but improved a little since last visit: - am: 274-445 >> 252-352 >> 187-250 >> 134-221 >> 145-256, lately <200 - before lunch: 260 >> n/c  - 2h after lunch: 400, 536 >> n/c >> n/c - before dinner: 153, 246, 352, 361 >> 257-417 >> 149-307, most low 200s >> 144-280 >> 155-233 (290, 305) - 2h after dinner: 300s >> n/c  - bedtime: 322-534 >> 252-420 >> 178-280, most low 200s >> 124-280 >> 140-201 (273) No lows. ? lowest sugar; he has hypoglycemia awareness but unknown how low. Highest CBG 305 x 1 in last mo.  When sugars very high >> pain in eyeballs.  Pt's meals are: - Breakfast: eggs, meat, grits, wheat toast - Lunch: may skip or eat late b'fast  - Dinner: chicken/fish/pork chops, 2 sides, salad, baked potatoes - Snacks: 2 a day - apple slices, popcorn  - has CKD, last BUN/creatinine:  Lab Results  Component Value Date   BUN 33* 01/08/2013   CREATININE 1.8* 01/08/2013  On Lisinopril. - last set of lipids: Lab Results  Component Value Date   CHOL 197 07/08/2012   HDL 28.70* 07/08/2012   LDLDIRECT 49.8 07/08/2012   TRIG 937.0* 07/08/2012   CHOLHDL 7 07/08/2012  On  Pravastatin. On ASA 81.  - last eye exam was in 01/20/2013. No DR reportedly. - + numbness and tingling in his feet and hands. On neurontin 600-600-900 mg.  He also has a history of HTN, HL, pseudogout, bilateral carpal tunnel s/p sx in each hand: 1985 and 2000.  I reviewed pt's medications, allergies, PMH, social hx, family hx and no changes required, except as mentioned above.  ROS: Constitutional: no weight gain/loss, no fatigue, no subjective hyperthermia/hypothermia Eyes: no blurry vision, no xerophthalmia ENT: no sore throat, no nodules palpated in throat, no dysphagia/odynophagia, no hoarseness Cardiovascular: no CP/SOB/palpitations/hand and leg swelling Respiratory: no cough/SOB Gastrointestinal: no N/V/D/C Musculoskeletal: no muscle/joint aches Skin: no rashes, Neurological: no tremors/numbness/tingling/dizziness  PE: BP 108/64  Pulse 93  Temp(Src) 98.1 F (36.7 C) (Oral)  Resp 12  SpO2 98% Wt Readings from Last 3 Encounters:  09/03/13 237 lb (107.502 kg)  07/23/13 234 lb (106.142 kg)  07/09/13 240 lb (108.863 kg)   Constitutional: overweight, in NAD Eyes: PERRLA, EOMI, no exophthalmos ENT: moist mucous membranes, no thyromegaly, no cervical lymphadenopathy Cardiovascular: RRR, No MRG, + bilateral feet swelling Respiratory: CTA B Gastrointestinal: abdomen soft, NT, ND, BS+ Musculoskeletal: no deformities, strength intact in all 4 Skin: moist, warm, no rashes  ASSESSMENT: 1. DM2, insulin-dependent, uncontrolled, with complications - PN - ? If chronic KD  PLAN:  1. Patient with long-standing, uncontrolled diabetes, with increased insulin resistance, with slowly improving CBGs on increasing doses of insulin - I suggested to increase Lantus as follows to optimize his am sugars:  Patient Instructions  Please increase the Lantus to 80 units at bedtime. Continue Novolog 30 units 2x a day Please start NovoLog 10 units with snack if it contains >15 g carbs  (subtract fiber content from the total nr of carbs).  Please return in 1.5 month with your sugar log.   Please stop at the lab.  - continue checking sugars at different times of the day - check 3 times a day, rotating checks - he is up to date with yearly eye exams - will check HbA1c today - given more sugar logs - RTC in 6 weeks  Office Visit on 09/03/2013  Component Date Value Ref Range Status  . Hemoglobin A1C 09/03/2013 8.6* 4.6 - 6.5 % Final   Glycemic Control Guidelines for People with Diabetes:Non Diabetic:  <6%Goal of Therapy: <7%Additional Action Suggested:  >8%    HbA1c improving!!!

## 2013-09-22 DIAGNOSIS — R809 Proteinuria, unspecified: Secondary | ICD-10-CM | POA: Diagnosis not present

## 2013-09-22 DIAGNOSIS — I129 Hypertensive chronic kidney disease with stage 1 through stage 4 chronic kidney disease, or unspecified chronic kidney disease: Secondary | ICD-10-CM | POA: Diagnosis not present

## 2013-09-22 DIAGNOSIS — N183 Chronic kidney disease, stage 3 unspecified: Secondary | ICD-10-CM | POA: Diagnosis not present

## 2013-09-22 DIAGNOSIS — E1129 Type 2 diabetes mellitus with other diabetic kidney complication: Secondary | ICD-10-CM | POA: Diagnosis not present

## 2013-09-30 DIAGNOSIS — N183 Chronic kidney disease, stage 3 unspecified: Secondary | ICD-10-CM | POA: Diagnosis not present

## 2013-09-30 DIAGNOSIS — I129 Hypertensive chronic kidney disease with stage 1 through stage 4 chronic kidney disease, or unspecified chronic kidney disease: Secondary | ICD-10-CM | POA: Diagnosis not present

## 2013-09-30 DIAGNOSIS — R809 Proteinuria, unspecified: Secondary | ICD-10-CM | POA: Diagnosis not present

## 2013-09-30 DIAGNOSIS — E1165 Type 2 diabetes mellitus with hyperglycemia: Secondary | ICD-10-CM | POA: Diagnosis not present

## 2013-09-30 DIAGNOSIS — E1129 Type 2 diabetes mellitus with other diabetic kidney complication: Secondary | ICD-10-CM | POA: Diagnosis not present

## 2013-10-20 ENCOUNTER — Ambulatory Visit (INDEPENDENT_AMBULATORY_CARE_PROVIDER_SITE_OTHER): Payer: Medicare Other | Admitting: Internal Medicine

## 2013-10-20 ENCOUNTER — Encounter: Payer: Self-pay | Admitting: Internal Medicine

## 2013-10-20 VITALS — BP 124/103 | HR 93 | Temp 97.8°F | Resp 12 | Wt 238.0 lb

## 2013-10-20 VITALS — BP 110/70 | HR 88 | Temp 98.2°F | Wt 235.0 lb

## 2013-10-20 DIAGNOSIS — A499 Bacterial infection, unspecified: Secondary | ICD-10-CM

## 2013-10-20 DIAGNOSIS — E1149 Type 2 diabetes mellitus with other diabetic neurological complication: Secondary | ICD-10-CM

## 2013-10-20 DIAGNOSIS — M71122 Other infective bursitis, left elbow: Secondary | ICD-10-CM

## 2013-10-20 DIAGNOSIS — B9689 Other specified bacterial agents as the cause of diseases classified elsewhere: Secondary | ICD-10-CM

## 2013-10-20 DIAGNOSIS — M7020 Olecranon bursitis, unspecified elbow: Secondary | ICD-10-CM | POA: Diagnosis not present

## 2013-10-20 MED ORDER — AMOXICILLIN-POT CLAVULANATE 875-125 MG PO TABS: 1.0000 | ORAL_TABLET | Freq: Two times a day (BID) | ORAL | Status: DC

## 2013-10-20 NOTE — Assessment & Plan Note (Signed)
Apparently never quite healed up from January Will treat again with augmentin for 2 weeks (and a refill) Discussed warm compresses

## 2013-10-20 NOTE — Patient Instructions (Signed)
Please use warm compresses on the left elbow several times per day till all the drainage stops and it closes over completely. Please take the colchicine twice every day

## 2013-10-20 NOTE — Progress Notes (Signed)
Patient ID: Aaron Mckenzie, male   DOB: 1960-10-21, 53 y.o.   MRN: [id number]  HPI: Aaron Mckenzie is a 53 y.o.-year-old male, returning for f/u for DM2, dx 2004, insulin-dependent since 12/2012, uncontrolled, with complications (neuropathy). Last visit 1.5 mo ago.  His olecranon bursitis site started oozing again 1 mo ago. Sometimes burning. He has it covered with a gauze and bandaid.   Last hemoglobin A1c was: Lab Results  Component Value Date   HGBA1C 8.6* 09/03/2013   HGBA1C 10.3* 05/16/2013   HGBA1C 11.5* 01/08/2013   Pt is on a regimen of: - Lantus 60 >> 70 >> 80 units at bedtime - Novolog 30 units bid (does not eat lunch) - NovoLog 10 units with snack if it contains >15 g carbs (subtract fiber content from the total nr of carbs), but not taking as not snacking We stopped Glipizide 5 mg bid when we started mealtime insulin We had to stop Metformin at last visit 2/2 decreased kidney fxn.  Pt checks sugars 2-3x a day - brings log - sugars higher in last month: - am: 274-445 >> 252-352 >> 187-250 >> 134-221 >> 145-256, lately <200 >> 217-260 (322) - before lunch: 260 >> n/c >> 178, 195 - 2h after lunch: 400, 536 >> n/c >> n/c - before dinner: 153, 246, 352, 361 >> 257-417 >> 149-307, most low 200s >> 144-280 >> 155-233 (290, 305) >> 213-323 (500x1) - 2h after dinner: 300s >> n/c  - bedtime: 322-534 >> 252-420 >> 178-280, most low 200s >> 124-280 >> 140-201 (273) >> 176-314 No lows. ? lowest sugar; he has hypoglycemia awareness but unknown how low. Highest CBG 500 x 1 in last mo.  When sugars very high >> pain in eyeballs.  Pt's meals are: - Breakfast: eggs, meat, grits, wheat toast - Lunch: may skip or eat late b'fast  - Dinner: chicken/fish/pork chops, 2 sides, salad, baked potatoes - Snacks: 2 a day - apple slices, popcorn  - has CKD, last BUN/creatinine:  Lab Results  Component Value Date   BUN 33* 01/08/2013   CREATININE 1.8* 01/08/2013  On Lisinopril. - last set  of lipids: Lab Results  Component Value Date   CHOL 197 07/08/2012   HDL 28.70* 07/08/2012   LDLDIRECT 49.8 07/08/2012   TRIG 937.0* 07/08/2012   CHOLHDL 7 07/08/2012  On Pravastatin. On ASA 81.  - last eye exam was in 01/20/2013. No DR reportedly. - + numbness and tingling in his feet and hands. On neurontin 600-600-900 mg.  He also has a history of HTN, HL, pseudogout, bilateral carpal tunnel s/p sx in each hand: 1985 and 2000.  I reviewed pt's medications, allergies, PMH, social hx, family hx and no changes required, except as mentioned above.  ROS: Constitutional: no weight gain/loss, no fatigue, no subjective hyperthermia/hypothermia, + poor sleep Eyes: no blurry vision, no xerophthalmia ENT: no sore throat, no nodules palpated in throat, no dysphagia/odynophagia, no hoarseness Cardiovascular: no CP/SOB/palpitations/+ hand and leg swelling Respiratory: no cough/SOB Gastrointestinal: no N/V/D/C Musculoskeletal: no muscle/joint aches Skin: no rashes, Neurological: no tremors/numbness/tingling/dizziness  PE: BP 124/103  Pulse 93  Temp(Src) 97.8 F (36.6 C) (Oral)  Resp 12  Wt 238 lb (107.956 kg)  SpO2 95% Wt Readings from Last 3 Encounters:  10/20/13 238 lb (107.956 kg)  09/03/13 237 lb (107.502 kg)  07/23/13 234 lb (106.142 kg)   Constitutional: overweight, in NAD Eyes: PERRLA, EOMI, no exophthalmos ENT: moist mucous membranes, no thyromegaly, no cervical lymphadenopathy Cardiovascular: RRR,  No MRG, + bilateral feet swelling Respiratory: CTA B Gastrointestinal: abdomen soft, NT, ND, BS+ Musculoskeletal: no deformities, strength intact in all 4 Skin: moist, warm, no rashes  ASSESSMENT: 1. DM2, insulin-dependent, uncontrolled, with complications - PN - ? If chronic KD  PLAN:  1. Patient with long-standing, uncontrolled diabetes, with increased insulin resistance, with worsening CBGs in last month, possibly from his infected olecranon bursitis. I advised him to see  Dr Silvio Pate to see if he needs ABx.  Patient Instructions  Please continue: - Lantus 80 units at bedtime - NovoLog 10 units with snack if it contains >15 g carbs  Please increase: - Novolog to 40 units 2x a day Please inject 20 units if you eat a lunch.  Please return in 1 month with your sugar log.   - continue checking sugars at different times of the day - check 3 times a day, rotating checks - he is up to date with yearly eye exams - given more sugar logs - RTC in 4-5 weeks

## 2013-10-20 NOTE — Patient Instructions (Signed)
Please continue: - Lantus 80 units at bedtime - NovoLog 10 units with snack if it contains >15 g carbs  Please increase: - Novolog to 40 units 2x a day Please inject 20 units if you eat a lunch.  Please return in 1 month with your sugar log.

## 2013-10-20 NOTE — Progress Notes (Signed)
Subjective:    Patient ID: Aaron Mckenzie, male    DOB: [birth year], 53 y.o.   MRN: [id number]  HPI Here with wife  Thinks his left elbow is infected again Had had some swelling and it may have opened by hitting it (not on purpose) Noted it was open since January---never completely healed up  It had stopped draining  Now seems infected again Red, painful and warm Going back a few weeks or even month  Dr Cruzita Lederer feels this is affecting his diabetes control  Current Outpatient Prescriptions on File Prior to Visit  Medication Sig Dispense Refill  . aspirin EC 81 MG tablet Take 81 mg by mouth daily.      . colchicine 0.6 MG tablet Take 1 tablet (0.6 mg total) by mouth 2 (two) times daily as needed.  60 tablet  1  . gabapentin (NEURONTIN) 300 MG capsule Take 300 mg by mouth 5 (five) times daily.       Marland Kitchen glucose blood (BAYER CONTOUR NEXT TEST) test strip Pt checking blood sugar 2-4 times daily. Dx 250.62  400 each  3  . insulin aspart (NOVOLOG FLEXPEN) 100 UNIT/ML FlexPen Inject up to 90 units a day as advised  30 pen  3  . Insulin Glargine (LANTUS SOLOSTAR) 100 UNIT/ML Solostar Pen Inject 70 units at bedtime daily  30 pen  3  . Insulin Pen Needle (FIFTY50 PEN NEEDLES) 31G X 5 MM MISC Use to inject insulin 3 times a day as instructed  100 each  5  . lisinopril (PRINIVIL,ZESTRIL) 20 MG tablet TAKE ONE TABLET BY MOUTH EVERY DAY  90 tablet  2  . pravastatin (PRAVACHOL) 40 MG tablet TAKE ONE TABLET BY MOUTH EVERY DAY  90 tablet  3  . urea (CARMOL) 10 % cream Apply topically daily as needed. For feet  454 g  3   No current facility-administered medications on file prior to visit.    No Known Allergies  Past Medical History  Diagnosis Date  . Diabetes mellitus   . Hypertension   . Pseudogout   . Ectodermal dysplasia   . Hyperlipidemia     Past Surgical History  Procedure Laterality Date  . Appendectomy    . Rib fracture surgery  1988    right    Family History  Problem  Relation Age of Onset  . Arthritis Mother   . Arthritis Father     History   Social History  . Marital Status: Married    Spouse Name: N/A    Number of Children: N/A  . Years of Education: N/A   Occupational History  . Retired after disability     Bussed tables at [employer] Topics  . Smoking status: Never Smoker   . Smokeless tobacco: Never Used  . Alcohol Use: No  . Drug Use: No  . Sexual Activity: Not on file   Other Topics Concern  . Not on file   Social History Narrative       Review of Systems No fever No nausea or vomiting Still has pain in feet from the neuropathy     Objective:   Physical Exam  Constitutional: He appears well-developed and well-nourished. No distress.  Musculoskeletal:  Left elbow bursa is red, warm and tender Visible discharge and several small open areas Overall size is the same as on right--which is otherwise quiet          Assessment & Plan:

## 2013-10-20 NOTE — Progress Notes (Signed)
Pre visit review using our clinic review tool, if applicable. No additional management support is needed unless otherwise documented below in the visit note. 

## 2013-10-24 ENCOUNTER — Other Ambulatory Visit: Payer: Self-pay | Admitting: Internal Medicine

## 2013-11-12 ENCOUNTER — Telehealth: Payer: Self-pay | Admitting: Internal Medicine

## 2013-11-12 ENCOUNTER — Other Ambulatory Visit: Payer: Self-pay | Admitting: *Deleted

## 2013-11-12 MED ORDER — INSULIN LISPRO 100 UNIT/ML (KWIKPEN): PEN_INJECTOR | SUBCUTANEOUS | Status: DC

## 2013-11-12 MED ORDER — INSULIN ASPART 100 UNIT/ML FLEXPEN: PEN_INJECTOR | SUBCUTANEOUS | Status: DC

## 2013-11-12 NOTE — Telephone Encounter (Signed)
Novolog is not paid for can the pharmacy change it to humalog he has been on humalog for about a year now. Please advise pharmacy of what to do

## 2013-11-12 NOTE — Telephone Encounter (Signed)
Pt called stating ins only covers humalog. Change done.

## 2013-11-24 ENCOUNTER — Other Ambulatory Visit: Payer: Self-pay | Admitting: Internal Medicine

## 2013-12-01 ENCOUNTER — Encounter: Payer: Self-pay | Admitting: Internal Medicine

## 2013-12-01 ENCOUNTER — Ambulatory Visit (INDEPENDENT_AMBULATORY_CARE_PROVIDER_SITE_OTHER): Payer: Medicare Other | Admitting: Internal Medicine

## 2013-12-01 ENCOUNTER — Other Ambulatory Visit: Payer: Self-pay | Admitting: Internal Medicine

## 2013-12-01 ENCOUNTER — Telehealth: Payer: Self-pay | Admitting: Internal Medicine

## 2013-12-01 VITALS — BP 110/68 | HR 99 | Temp 98.2°F | Resp 12 | Wt 236.8 lb

## 2013-12-01 DIAGNOSIS — E1140 Type 2 diabetes mellitus with diabetic neuropathy, unspecified: Secondary | ICD-10-CM

## 2013-12-01 DIAGNOSIS — E1165 Type 2 diabetes mellitus with hyperglycemia: Principal | ICD-10-CM

## 2013-12-01 DIAGNOSIS — IMO0002 Reserved for concepts with insufficient information to code with codable children: Secondary | ICD-10-CM

## 2013-12-01 DIAGNOSIS — E1141 Type 2 diabetes mellitus with diabetic mononeuropathy: Secondary | ICD-10-CM | POA: Diagnosis not present

## 2013-12-01 DIAGNOSIS — E1149 Type 2 diabetes mellitus with other diabetic neurological complication: Secondary | ICD-10-CM

## 2013-12-01 LAB — HEMOGLOBIN A1C: HEMOGLOBIN A1C: 9.6 % — AB (ref 4.6–6.5)

## 2013-12-01 MED ORDER — "INSULIN SYRINGE-NEEDLE U-100 30G X 1/2"" 0.5 ML MISC": Status: DC

## 2013-12-01 MED ORDER — INSULIN REGULAR HUMAN (CONC) 500 UNIT/ML ~~LOC~~ SOLN: SUBCUTANEOUS | Status: DC

## 2013-12-01 NOTE — Patient Instructions (Signed)
Please stop the Lantus and NovoLog and start U500 insulin - Inject 20 units on your syringe (0.20 mL) before breakfast and 16 units on your syringe (0.16 mL) before dinner. Please return in 1 month with your sugar log.

## 2013-12-01 NOTE — Telephone Encounter (Signed)
Patient need prior for meds Humulin R 500 units before ins will pay

## 2013-12-01 NOTE — Progress Notes (Addendum)
Patient ID: Aaron Mckenzie, male   DOB: [birth year], 53 y.o.   MRN: [id number]  HPI: Aaron Mckenzie is a 53 y.o.-year-old male, returning for f/u for DM2, dx 2004, insulin-dependent since 12/2012, uncontrolled, with complications (neuropathy). Last visit 1.5 mo ago.  Recently had a septic olecranon bursitis.  Last hemoglobin A1c was: Lab Results  Component Value Date   HGBA1C 8.6* 09/03/2013   HGBA1C 10.3* 05/16/2013   HGBA1C 11.5* 01/08/2013   Pt is on a regimen of: - Lantus 60 >> 70 >> 80 units at bedtime - Novolog 30 >> 40 units bid (does not eat lunch, if he does, takes 20 units) - NovoLog 10 units with snack if it contains >15 g carbs (subtract fiber content from the total nr of carbs) We stopped Glipizide 5 mg bid when we started mealtime insulin We had to stop Metformin at last visit 2/2 decreased kidney fxn.  Pt checks sugars 2-3x a day - brings log - sugars still high: - am: 274-445 >> 252-352 >> 187-250 >> 134-221 >> 145-256, lately <200 >> 217-260 (322) >> 202-328 - before lunch: 260 >> n/c >> 178, 195 >> 249 - 2h after lunch: 400, 536 >> n/c >> n/c - before dinner: 153, 246, 352, 361 >> 257-417 >> 149-307, most low 200s >> 144-280 >> 155-233 (290, 305) >> 213-323 (500x1) >> 236-323 - 2h after dinner: 300s >> n/c  - bedtime: 322-534 >> 252-420 >> 178-280, most low 200s >> 124-280 >> 140-201 (273) >> 176-314 >> 206-310 No lows. ? lowest sugar; he has hypoglycemia awareness but unknown how low. Highest CBG 300s x 1 in last mo.  When sugars very high >> pain in eyeballs.  Pt's meals are: - Breakfast: eggs, meat, grits, wheat toast - Lunch: may skip or eat late b'fast  - Dinner: chicken/fish/pork chops, 2 sides, salad, baked potatoes - Snacks: 2 a day - apple slices, popcorn  - has CKD, last BUN/creatinine:  Lab Results  Component Value Date   BUN 33* 01/08/2013   CREATININE 1.8* 01/08/2013  On Lisinopril. - last set of lipids: Lab Results  Component Value Date    CHOL 197 07/08/2012   HDL 28.70* 07/08/2012   LDLDIRECT 49.8 07/08/2012   TRIG 937.0* 07/08/2012   CHOLHDL 7 07/08/2012  On Pravastatin. On ASA 81.  - last eye exam was in 01/20/2013. No DR reportedly. - + numbness and tingling in his feet and hands. On neurontin 600-600-900 mg.  He also has a history of HTN, HL, pseudogout, bilateral carpal tunnel s/p sx in each hand: 1985 and 2000.  I reviewed pt's medications, allergies, PMH, social hx, family hx and no changes required, except as mentioned above.  ROS: Constitutional: no weight gain/loss, no fatigue, no subjective hyperthermia/hypothermia, + poor sleep Eyes: no blurry vision, no xerophthalmia ENT: no sore throat, no nodules palpated in throat, no dysphagia/odynophagia, no hoarseness Cardiovascular: no CP/SOB/palpitations/+ hand and leg swelling Respiratory: no cough/SOB Gastrointestinal: no N/V/D/C Musculoskeletal: no muscle/joint aches Skin: no rashes, Neurological: no tremors/numbness/tingling/dizziness  PE: BP 110/68  Pulse 99  Temp(Src) 98.2 F (36.8 C) (Oral)  Resp 12  Wt 236 lb 12.8 oz (107.412 kg)  SpO2 97% Wt Readings from Last 3 Encounters:  12/01/13 236 lb 12.8 oz (107.412 kg)  10/20/13 235 lb (106.595 kg)  10/20/13 238 lb (107.956 kg)   Constitutional: overweight, in NAD Eyes: PERRLA, EOMI, no exophthalmos ENT: moist mucous membranes, no thyromegaly, no cervical lymphadenopathy Cardiovascular: RRR, No MRG, + bilateral  feet swelling Respiratory: CTA B Gastrointestinal: abdomen soft, NT, ND, BS+ Musculoskeletal: no deformities, strength intact in all 4 Skin: moist, warm, no rashes  ASSESSMENT: 1. DM2, insulin-dependent, uncontrolled, with complications - PN - ? If chronic KD  PLAN:  1. Patient with long-standing, uncontrolled diabetes, with increased insulin resistance, with still very poor DM control despite high doses of insulin (basal-bolus regimen). We will start U500 insulin: Patient Instructions   Please stop the Lantus and NovoLog and start U500 insulin - Inject 20 units on your syringe (0.20 mL) before breakfast and 16 units on your syringe (0.16 mL) before dinner. Please return in 1 month with your sugar log.  - continue checking sugars at different times of the day - check 3 times a day, rotating checks - he is up to date with yearly eye exams - given more sugar logs - check Hba1c today - RTC in 4-5 weeks  Office Visit on 12/01/2013  Component Date Value Ref Range Status  . Hemoglobin A1C 12/01/2013 9.6* 4.6 - 6.5 % Final   Glycemic Control Guidelines for People with Diabetes:Non Diabetic:  <6%Goal of Therapy: <7%Additional Action Suggested:  >8%    Msg sent: Dear Aaron Mckenzie, HbA1c is higher >> let's see how the U500 insulin works. We will start a pre-authorization form for it. Sincerely, Philemon Kingdom MD

## 2013-12-02 ENCOUNTER — Other Ambulatory Visit: Payer: Self-pay | Admitting: *Deleted

## 2013-12-02 MED ORDER — GABAPENTIN 300 MG PO CAPS: 300.0000 mg | ORAL_CAPSULE | Freq: Every day | ORAL | Status: DC

## 2013-12-02 NOTE — Telephone Encounter (Signed)
Last filled 10/18/13, pt has cpx scheduled for 01/26/14.

## 2013-12-02 NOTE — Telephone Encounter (Signed)
Okay to fill for a year

## 2013-12-02 NOTE — Telephone Encounter (Signed)
Ins contacted. PA approved. Faxed approval to pt's pharmacy, [facility].

## 2013-12-02 NOTE — Telephone Encounter (Signed)
rx sent to pharmacy

## 2013-12-08 DIAGNOSIS — E1121 Type 2 diabetes mellitus with diabetic nephropathy: Secondary | ICD-10-CM | POA: Diagnosis not present

## 2013-12-08 DIAGNOSIS — E1165 Type 2 diabetes mellitus with hyperglycemia: Secondary | ICD-10-CM | POA: Diagnosis not present

## 2013-12-08 DIAGNOSIS — N183 Chronic kidney disease, stage 3 (moderate): Secondary | ICD-10-CM | POA: Diagnosis not present

## 2013-12-08 DIAGNOSIS — R809 Proteinuria, unspecified: Secondary | ICD-10-CM | POA: Diagnosis not present

## 2013-12-08 DIAGNOSIS — I129 Hypertensive chronic kidney disease with stage 1 through stage 4 chronic kidney disease, or unspecified chronic kidney disease: Secondary | ICD-10-CM | POA: Diagnosis not present

## 2013-12-10 ENCOUNTER — Other Ambulatory Visit: Payer: Self-pay | Admitting: Internal Medicine

## 2013-12-11 ENCOUNTER — Other Ambulatory Visit: Payer: Self-pay

## 2013-12-26 ENCOUNTER — Other Ambulatory Visit: Payer: Self-pay | Admitting: Internal Medicine

## 2014-01-01 ENCOUNTER — Encounter: Payer: Self-pay | Admitting: Internal Medicine

## 2014-01-01 ENCOUNTER — Ambulatory Visit (INDEPENDENT_AMBULATORY_CARE_PROVIDER_SITE_OTHER): Payer: Medicare Other | Admitting: Internal Medicine

## 2014-01-01 VITALS — BP 112/70 | HR 95 | Temp 98.3°F | Resp 12 | Wt 237.4 lb

## 2014-01-01 DIAGNOSIS — E1165 Type 2 diabetes mellitus with hyperglycemia: Principal | ICD-10-CM

## 2014-01-01 DIAGNOSIS — IMO0002 Reserved for concepts with insufficient information to code with codable children: Secondary | ICD-10-CM

## 2014-01-01 DIAGNOSIS — E1149 Type 2 diabetes mellitus with other diabetic neurological complication: Secondary | ICD-10-CM

## 2014-01-01 DIAGNOSIS — E1141 Type 2 diabetes mellitus with diabetic mononeuropathy: Secondary | ICD-10-CM

## 2014-01-01 MED ORDER — INSULIN REGULAR HUMAN (CONC) 500 UNIT/ML ~~LOC~~ SOLN
SUBCUTANEOUS | Status: DC
Start: 2014-01-01 — End: 2014-06-03

## 2014-01-01 NOTE — Patient Instructions (Signed)
Please increase the U500 insulin:  - 16 units on your syringe (0.16 mL) before breakfast   - 8 units on your syringe (0.10 mL) before breakfast  - 16 units on your syringe (0.16 mL) before dinner.  Please return in 2 months with your sugar log.   Please let me know if the sugars are consistently <80 or >180.

## 2014-01-01 NOTE — Progress Notes (Signed)
Patient ID: Aaron Mckenzie, male   DOB: [birth year], 53 y.o.   MRN: [id number]  HPI: Aaron Mckenzie is a 53 y.o.-year-old male, returning for f/u for DM2, dx 2004, insulin-dependent since 12/2012, uncontrolled, with complications (CKD, neuropathy). Last visit 1 mo ago.  Last hemoglobin A1c was: Lab Results  Component Value Date   HGBA1C 9.6* 12/01/2013   HGBA1C 8.6* 09/03/2013   HGBA1C 10.3* 05/16/2013   Pt was on a regimen of: - Lantus 60 >> 70 >> 80 units at bedtime - Novolog 30 >> 40 units bid (does not eat lunch, if he does, takes 20 units) - NovoLog 10 units with snack if it contains >15 g carbs (subtract fiber content from the total nr of carbs) We stopped Glipizide 5 mg bid when we started mealtime insulin We had to stop Metformin at last visit 2/2 decreased kidney fxn.  Pt is now on: - U500 20 units before b'fast and 16 units before dinner  Pt checks sugars 2-3x a day - brings log: - am: 274-445 >> 252-352 >> 187-250 >> 134-221 >> 145-256, lately <200 >> 217-260 (322) >> 202-328 >> 169-209, 246x1 (bowl of cereal ~midnight) - before lunch: 260 >> n/c >> 178, 195 >> 249 >> 224 - 2h after lunch: 400, 536 >> n/c >> n/c - before dinner: 153, 246, 352, 361 >> 257-417 >> 149-307, most low 200s >> 144-280 >> 155-233 (290, 305) >> 213-323 (500x1) >> 236-323 >> 81-94 if he skips lunch; 126-259 - 2h after dinner: 300s >> n/c  - bedtime: 322-534 >> 252-420 >> 178-280, most low 200s >> 124-280 >> 140-201 (273) >> 176-314 >> 206-310 >> 166, 219 No lows. ? lowest sugar; he has hypoglycemia awareness but unknown how low. Highest CBG 300s x 1 >> 259 in last mo.  When sugars very high >> pain in eyeballs.  Pt's meals are: - Breakfast: eggs, meat, grits, wheat toast - Lunch: may skip or eat late b'fast  - Dinner: chicken/fish/pork chops, 2 sides, salad, baked potatoes - Snacks: 2 a day - apple slices, popcorn  - has CKD, last BUN/creatinine:  Lab Results  Component Value Date   BUN  33* 01/08/2013   CREATININE 1.8* 01/08/2013  On Lisinopril. - last set of lipids: Lab Results  Component Value Date   CHOL 197 07/08/2012   HDL 28.70* 07/08/2012   LDLDIRECT 49.8 07/08/2012   TRIG 937.0* 07/08/2012   CHOLHDL 7 07/08/2012  On Pravastatin. On ASA 81.  - last eye exam was in 01/20/2013. No DR reportedly. - + numbness and tingling in his feet and hands. On neurontin 600-600-900 mg.  He also has a history of HTN, HL, pseudogout, bilateral carpal tunnel s/p sx in each hand: 1985 and 2000.  I reviewed pt's medications, allergies, PMH, social hx, family hx and no changes required, except as mentioned above.  ROS: Constitutional: no weight gain/loss, no fatigue, no subjective hyperthermia/hypothermia Eyes: no blurry vision, no xerophthalmia ENT: no sore throat, no nodules palpated in throat, no dysphagia/odynophagia, no hoarseness Cardiovascular: no CP/SOB/palpitations/+ hand and leg swelling Respiratory: no cough/SOB Gastrointestinal: no N/V/D/C Musculoskeletal: no muscle/joint aches Skin: no rashes, Neurological: no tremors/numbness/tingling/dizziness  PE: BP 112/70 mmHg  Pulse 95  Temp(Src) 98.3 F (36.8 C) (Oral)  Resp 12  Wt 237 lb 6.4 oz (107.684 kg)  SpO2 97% Wt Readings from Last 3 Encounters:  01/01/14 237 lb 6.4 oz (107.684 kg)  12/01/13 236 lb 12.8 oz (107.412 kg)  10/20/13 235 lb (106.595  kg)   Constitutional: overweight, in NAD Eyes: PERRLA, EOMI, no exophthalmos ENT: moist mucous membranes, no thyromegaly, no cervical lymphadenopathy Cardiovascular: RRR, No MRG, + bilateral leg swelling Respiratory: CTA B Gastrointestinal: abdomen soft, NT, ND, BS+ Musculoskeletal: no deformities, strength intact in all 4 Skin: moist, warm, no rashes  ASSESSMENT: 1. DM2, insulin-dependent, uncontrolled, with complications - PN - ? If chronic KD  PLAN:  1. Patient with long-standing, uncontrolled diabetes, with increased insulin resistance, with  improving DM control after starting U500 insulin, per his log: Patient Instructions  Please increase the U500 insulin:  - 16 units on your syringe (0.16 mL) before breakfast   - 8 units on your syringe (0.10 mL) before breakfast  - 16 units on your syringe (0.16 mL) before dinner.  Please return in 2 months with your sugar log.   Please let me know if the sugars are consistently <80 or >180.   - continue checking sugars at different times of the day - check 3 times a day, rotating checks - he is up to date with yearly eye exams >> advised to schedule a new one - refuses flu vaccine - will check HbA1c at next visit - RTC in 2 mo

## 2014-01-02 ENCOUNTER — Telehealth: Payer: Self-pay | Admitting: Internal Medicine

## 2014-01-02 DIAGNOSIS — E1149 Type 2 diabetes mellitus with other diabetic neurological complication: Secondary | ICD-10-CM

## 2014-01-02 DIAGNOSIS — IMO0002 Reserved for concepts with insufficient information to code with codable children: Secondary | ICD-10-CM

## 2014-01-02 DIAGNOSIS — E1165 Type 2 diabetes mellitus with hyperglycemia: Principal | ICD-10-CM

## 2014-01-02 MED ORDER — GLUCOSE BLOOD VI STRP: ORAL_STRIP | Status: DC

## 2014-01-02 NOTE — Telephone Encounter (Signed)
Rx faxed back to [facility] with new ICD10 code.

## 2014-01-02 NOTE — Telephone Encounter (Signed)
[place] pharmacy calling stated they need ICD 9 code for diabetic test strips.

## 2014-01-20 DIAGNOSIS — E11329 Type 2 diabetes mellitus with mild nonproliferative diabetic retinopathy without macular edema: Secondary | ICD-10-CM | POA: Diagnosis not present

## 2014-01-20 DIAGNOSIS — H40013 Open angle with borderline findings, low risk, bilateral: Secondary | ICD-10-CM | POA: Diagnosis not present

## 2014-01-20 LAB — HM DIABETES EYE EXAM

## 2014-01-26 ENCOUNTER — Ambulatory Visit (INDEPENDENT_AMBULATORY_CARE_PROVIDER_SITE_OTHER): Payer: Medicare Other | Admitting: Internal Medicine

## 2014-01-26 ENCOUNTER — Encounter: Payer: Self-pay | Admitting: Internal Medicine

## 2014-01-26 VITALS — BP 120/80 | HR 97 | Temp 97.7°F | Ht 69.0 in | Wt 238.0 lb

## 2014-01-26 DIAGNOSIS — M1189 Other specified crystal arthropathies, multiple sites: Secondary | ICD-10-CM | POA: Insufficient documentation

## 2014-01-26 DIAGNOSIS — Z0000 Encounter for general adult medical examination without abnormal findings: Secondary | ICD-10-CM | POA: Diagnosis not present

## 2014-01-26 DIAGNOSIS — E1149 Type 2 diabetes mellitus with other diabetic neurological complication: Secondary | ICD-10-CM

## 2014-01-26 DIAGNOSIS — E1141 Type 2 diabetes mellitus with diabetic mononeuropathy: Secondary | ICD-10-CM | POA: Diagnosis not present

## 2014-01-26 DIAGNOSIS — E785 Hyperlipidemia, unspecified: Secondary | ICD-10-CM

## 2014-01-26 DIAGNOSIS — E1042 Type 1 diabetes mellitus with diabetic polyneuropathy: Secondary | ICD-10-CM | POA: Diagnosis not present

## 2014-01-26 DIAGNOSIS — E1122 Type 2 diabetes mellitus with diabetic chronic kidney disease: Secondary | ICD-10-CM

## 2014-01-26 DIAGNOSIS — E1322 Other specified diabetes mellitus with diabetic chronic kidney disease: Secondary | ICD-10-CM | POA: Diagnosis not present

## 2014-01-26 DIAGNOSIS — N183 Chronic kidney disease, stage 3 unspecified: Secondary | ICD-10-CM

## 2014-01-26 DIAGNOSIS — E1165 Type 2 diabetes mellitus with hyperglycemia: Secondary | ICD-10-CM

## 2014-01-26 DIAGNOSIS — Z1211 Encounter for screening for malignant neoplasm of colon: Secondary | ICD-10-CM

## 2014-01-26 DIAGNOSIS — I10 Essential (primary) hypertension: Secondary | ICD-10-CM

## 2014-01-26 DIAGNOSIS — IMO0002 Reserved for concepts with insufficient information to code with codable children: Secondary | ICD-10-CM

## 2014-01-26 DIAGNOSIS — N184 Chronic kidney disease, stage 4 (severe): Secondary | ICD-10-CM | POA: Insufficient documentation

## 2014-01-26 LAB — CBC WITH DIFFERENTIAL/PLATELET
BASOS PCT: 0.6 % (ref 0.0–3.0)
Basophils Absolute: 0 10*3/uL (ref 0.0–0.1)
EOS PCT: 3.5 % (ref 0.0–5.0)
Eosinophils Absolute: 0.2 10*3/uL (ref 0.0–0.7)
HEMATOCRIT: 41.7 % (ref 39.0–52.0)
HEMOGLOBIN: 13.8 g/dL (ref 13.0–17.0)
LYMPHS ABS: 1.7 10*3/uL (ref 0.7–4.0)
Lymphocytes Relative: 28.1 % (ref 12.0–46.0)
MCHC: 33.1 g/dL (ref 30.0–36.0)
MCV: 83.5 fl (ref 78.0–100.0)
MONO ABS: 0.5 10*3/uL (ref 0.1–1.0)
MONOS PCT: 8 % (ref 3.0–12.0)
Neutro Abs: 3.6 10*3/uL (ref 1.4–7.7)
Neutrophils Relative %: 59.8 % (ref 43.0–77.0)
Platelets: 165 10*3/uL (ref 150.0–400.0)
RBC: 4.99 Mil/uL (ref 4.22–5.81)
RDW: 14.4 % (ref 11.5–15.5)
WBC: 6.1 10*3/uL (ref 4.0–10.5)

## 2014-01-26 LAB — COMPREHENSIVE METABOLIC PANEL
ALK PHOS: 77 U/L (ref 39–117)
ALT: 60 U/L — ABNORMAL HIGH (ref 0–53)
AST: 53 U/L — ABNORMAL HIGH (ref 0–37)
Albumin: 3.9 g/dL (ref 3.5–5.2)
BILIRUBIN TOTAL: 0.6 mg/dL (ref 0.2–1.2)
BUN: 28 mg/dL — ABNORMAL HIGH (ref 6–23)
CO2: 20 meq/L (ref 19–32)
CREATININE: 1.7 mg/dL — AB (ref 0.4–1.5)
Calcium: 9.1 mg/dL (ref 8.4–10.5)
Chloride: 103 mEq/L (ref 96–112)
GFR: 45.16 mL/min — AB (ref >60.00)
GLUCOSE: 292 mg/dL — AB (ref 70–99)
Potassium: 3.8 mEq/L (ref 3.5–5.1)
Sodium: 134 mEq/L — ABNORMAL LOW (ref 135–145)
Total Protein: 6.9 g/dL (ref 6.0–8.3)

## 2014-01-26 LAB — LIPID PANEL
CHOL/HDL RATIO: 8
Cholesterol: 168 mg/dL (ref 0–200)
HDL: 21.3 mg/dL — ABNORMAL LOW (ref >39.00)
NonHDL: 146.7
Triglycerides: 356 mg/dL — ABNORMAL HIGH (ref 0.0–149.0)
VLDL: 71.2 mg/dL — AB (ref 0.0–40.0)

## 2014-01-26 LAB — T4, FREE: Free T4: 1 ng/dL (ref 0.60–1.60)

## 2014-01-26 LAB — HM DIABETES FOOT EXAM

## 2014-01-26 LAB — LDL CHOLESTEROL, DIRECT: Direct LDL: 93.7 mg/dL

## 2014-01-26 MED ORDER — GABAPENTIN 300 MG PO CAPS: 900.0000 mg | ORAL_CAPSULE | Freq: Three times a day (TID) | ORAL | Status: DC

## 2014-01-26 NOTE — Assessment & Plan Note (Signed)
On ACEI Will recheck labs

## 2014-01-26 NOTE — Assessment & Plan Note (Signed)
Working with Dr Cruzita Lederer but not compliant with proper eating

## 2014-01-26 NOTE — Progress Notes (Signed)
Pre visit review using our clinic review tool, if applicable. No additional management support is needed unless otherwise documented below in the visit note. 

## 2014-01-26 NOTE — Assessment & Plan Note (Addendum)
I have personally reviewed the Medicare Annual Wellness questionnaire and have noted 1. The patient's medical and social history 2. Their use of alcohol, tobacco or illicit drugs 3. Their current medications and supplements 4. The patient's functional ability including ADL's, fall risks, home safety risks and hearing or visual             impairment. 5. Diet and physical activities 6. Evidence for depression or mood disorders  The patients weight, height, BMI and visual acuity have been recorded in the chart I have made referrals, counseling and provided education to the patient based review of the above and I have provided the pt with a written personalized care plan for preventive services.  I have provided you with a copy of your personalized plan for preventive services. Please take the time to review along with your updated medication list.  Doesn't want flu shot Defer PSA to next year Fecal immunoassay Discussed fitness

## 2014-01-26 NOTE — Progress Notes (Signed)
Subjective:    Patient ID: Aaron Mckenzie, male    DOB: Jul 26, 1960, 53 y.o.   MRN: [id number]  HPI Here with wife Medicare wellness and follow up No falls Reviewed form and advanced directives Reviewed other doctors No exercise No alcohol or tobacco Vision and hearing okay Independent with instrumental ADLs No cognitive problems  Having troubles with diabetes still Neuropathy worsening--gabapentin not really helping Sugars still high despite new insulin Knows he is still not eating right--but they are working on it  Still with some hand and elbow pain Trouble bending thumb and 2nd finger on right Continues on the colchicine  No chest pain No SOB Stable edema-- usually better in the morning but persists No dizziness or syncope  Current Outpatient Prescriptions on File Prior to Visit  Medication Sig Dispense Refill  . aspirin EC 81 MG tablet Take 81 mg by mouth daily.    Marland Kitchen COLCRYS 0.6 MG tablet TAKE ONE TABLET BY MOUTH TWICE DAILY AS NEEDED 60 tablet 0  . gabapentin (NEURONTIN) 300 MG capsule Take 1 capsule (300 mg total) by mouth 5 (five) times daily. 150 capsule 11  . glucose blood test strip CHECK BLOOD SUGAR 2 TO 4 TIMES DAILY. DX CODE E11.41 100 each 2  . insulin regular human CONCENTRATED (HUMULIN R) 500 UNIT/ML SOLN injection Inject under skin 80 units (0.16 mL) before breakfast, 40 units (0.8 mL) before lunch, and 80 units (0.16 mL) before dinner 20 mL 2  . Insulin Syringe-Needle U-100 (B-D INS SYRINGE 0.5CC/30GX1/2") 30G X 1/2" 0.5 ML MISC Use 2x a day 100 each 11  . lisinopril (PRINIVIL,ZESTRIL) 20 MG tablet TAKE ONE TABLET BY MOUTH EVERY DAY 90 tablet 2  . pravastatin (PRAVACHOL) 40 MG tablet TAKE ONE TABLET BY MOUTH EVERY DAY 90 tablet 0  . urea (CARMOL) 10 % cream Apply topically daily as needed. For feet 454 g 3   No current facility-administered medications on file prior to visit.    No Known Allergies  Past Medical History  Diagnosis Date  .  Diabetes mellitus   . Hypertension   . Pseudogout   . Ectodermal dysplasia   . Hyperlipidemia   . Type 2 diabetes mellitus with neurological manifestations, uncontrolled 04/23/2008    Qualifier: Diagnosis of  By: Silvio Pate MD, [facility]     Past Surgical History  Procedure Laterality Date  . Appendectomy    . Rib fracture surgery  1988    right    Family History  Problem Relation Age of Onset  . Arthritis Mother   . Arthritis Father     History   Social History  . Marital Status: Married    Spouse Name: N/A    Number of Children: N/A  . Years of Education: N/A   Occupational History  . 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 Smoking status: Never Smoker   . Smokeless tobacco: Never Used  . Alcohol Use: No  . Drug Use: No  . Sexual Activity: Not on file   Other Topics Concern  . Not on file   Social History Narrative   No living will or health care POA   Would want wife to make medical decisions for him   Would accept CPR but no prolonged ventilation   Not sure about tube feeds   Review of Systems  Does generally sleep okay Weight is stable Bowels okay Voids okay  Objective:   Physical Exam  Constitutional: He is oriented to person, place, and time. He appears well-developed and well-nourished. No distress.  HENT:  Mouth/Throat: Oropharynx is clear and moist. No oropharyngeal exudate.  Neck: Normal range of motion. Neck supple. No thyromegaly present.  Cardiovascular: Normal rate, normal heart sounds and intact distal pulses.  Exam reveals no gallop.   No murmur heard. Pulmonary/Chest: Effort normal and breath sounds normal. No respiratory distress. He has no wheezes. He has no rales.  Abdominal: Soft. There is no tenderness.  Musculoskeletal:  Right knee thickened Stiffness in fingers  Lymphadenopathy:    He has no cervical adenopathy.  Neurological: He is alert and oriented to person, place, and  time.  President-- "Elyn Peers, [place], [place]" [phone] [id number] Recall 2/3  Skin: No rash noted. No erythema.  Mycotic toenails Scaling on feet but no ulcers  Psychiatric: He has a normal mood and affect. His behavior is normal.          Assessment & Plan:

## 2014-01-26 NOTE — Assessment & Plan Note (Signed)
Will check labs on statin

## 2014-01-26 NOTE — Assessment & Plan Note (Signed)
On colchicine Will send to rheumatology if worsens Handicapped tag for bad knees

## 2014-01-26 NOTE — Patient Instructions (Addendum)
Increase the gabapentin to 3 capsules three times daily. Let me know in a few weeks if you feel you need something else for the nerve pain.  Exercise to Lose Weight Exercise and a healthy diet may help you lose weight. Your doctor may suggest specific exercises. EXERCISE IDEAS AND TIPS  Choose low-cost things you enjoy doing, such as walking, bicycling, or exercising to workout videos.  Take stairs instead of the elevator.  Walk during your lunch break.  Park your car further away from work or school.  Go to a gym or an exercise class.  Start with 5 to 10 minutes of exercise each day. Build up to 30 minutes of exercise 4 to 6 days a week.  Wear shoes with good support and comfortable clothes.  Stretch before and after working out.  Work out until you breathe harder and your heart beats faster.  Drink extra water when you exercise.  Do not do so much that you hurt yourself, feel dizzy, or get very short of breath. Exercises that burn about 150 calories:  Running 1  miles in 15 minutes.  Playing volleyball for 45 to 60 minutes.  Washing and waxing a car for 45 to 60 minutes.  Playing touch football for 45 minutes.  Walking 1  miles in 35 minutes.  Pushing a stroller 1  miles in 30 minutes.  Playing basketball for 30 minutes.  Raking leaves for 30 minutes.  Bicycling 5 miles in 30 minutes.  Walking 2 miles in 30 minutes.  Dancing for 30 minutes.  Shoveling snow for 15 minutes.  Swimming laps for 20 minutes.  Walking up stairs for 15 minutes.  Bicycling 4 miles in 15 minutes.  Gardening for 30 to 45 minutes.  Jumping rope for 15 minutes.  Washing windows or floors for 45 to 60 minutes. Document Released: 03/18/2010 Document Revised: 05/08/2011 Document Reviewed: 03/18/2010 [facility] Patient Information 2015 [place], [place]. This information is not intended to replace advice given to you by your health care provider. Make sure you discuss any questions  you have with your health care provider.

## 2014-01-26 NOTE — Assessment & Plan Note (Signed)
BP Readings from Last 3 Encounters:  01/26/14 120/80  01/01/14 112/70  12/01/13 110/68   On ACEI

## 2014-01-26 NOTE — Assessment & Plan Note (Signed)
Worsening pain in feet--day and night Will increase the gabapentin Add low dose nortriptyline if pain persists

## 2014-01-28 ENCOUNTER — Other Ambulatory Visit (INDEPENDENT_AMBULATORY_CARE_PROVIDER_SITE_OTHER): Payer: Medicare Other

## 2014-01-28 ENCOUNTER — Encounter: Payer: Self-pay | Admitting: Internal Medicine

## 2014-01-28 DIAGNOSIS — Z1211 Encounter for screening for malignant neoplasm of colon: Secondary | ICD-10-CM | POA: Diagnosis not present

## 2014-01-29 LAB — FECAL OCCULT BLOOD, IMMUNOCHEMICAL: Fecal Occult Bld: NEGATIVE

## 2014-02-05 ENCOUNTER — Other Ambulatory Visit: Payer: Self-pay | Admitting: Internal Medicine

## 2014-02-16 ENCOUNTER — Encounter: Payer: Self-pay | Admitting: Internal Medicine

## 2014-02-16 ENCOUNTER — Ambulatory Visit (INDEPENDENT_AMBULATORY_CARE_PROVIDER_SITE_OTHER): Payer: Medicare Other | Admitting: Internal Medicine

## 2014-02-16 VITALS — BP 128/80 | HR 92 | Temp 97.4°F | Wt 237.0 lb

## 2014-02-16 DIAGNOSIS — J0100 Acute maxillary sinusitis, unspecified: Secondary | ICD-10-CM

## 2014-02-16 MED ORDER — HYDROCODONE-HOMATROPINE 5-1.5 MG/5ML PO SYRP: 5.0000 mL | ORAL_SOLUTION | Freq: Every evening | ORAL | Status: DC | PRN

## 2014-02-16 MED ORDER — AMOXICILLIN 500 MG PO TABS: 1000.0000 mg | ORAL_TABLET | Freq: Two times a day (BID) | ORAL | Status: DC

## 2014-02-16 NOTE — Assessment & Plan Note (Signed)
Seems to have secondary infection  Will treat with amoxil and cough syrup Discussed supportive care

## 2014-02-16 NOTE — Progress Notes (Signed)
Pre visit review using our clinic review tool, if applicable. No additional management support is needed unless otherwise documented below in the visit note. 

## 2014-02-16 NOTE — Progress Notes (Signed)
Subjective:    Patient ID: ISSAAC Mckenzie, male    DOB: [birth year], 53 y.o.   MRN: [id number]  HPI Here with wife---both with respiratory illness  Cough, occasional runny nose, wheezing at times Brief relief from OTC cough med--but up a lot last night Started 9-10 days ago Waxed and waned Green nasal discharge--not much coming up with cough  No SOB No fever No chills or sweats  Current Outpatient Prescriptions on File Prior to Visit  Medication Sig Dispense Refill  . aspirin EC 81 MG tablet Take 81 mg by mouth daily.    Marland Kitchen COLCRYS 0.6 MG tablet TAKE ONE TABLET BY MOUTH TWICE DAILY AS NEEDED 60 tablet 1  . gabapentin (NEURONTIN) 300 MG capsule Take 3 capsules (900 mg total) by mouth 3 (three) times daily. 270 capsule 11  . glucose blood test strip CHECK BLOOD SUGAR 2 TO 4 TIMES DAILY. DX CODE E11.41 100 each 2  . insulin regular human CONCENTRATED (HUMULIN R) 500 UNIT/ML SOLN injection Inject under skin 80 units (0.16 mL) before breakfast, 40 units (0.8 mL) before lunch, and 80 units (0.16 mL) before dinner 20 mL 2  . Insulin Syringe-Needle U-100 (B-D INS SYRINGE 0.5CC/30GX1/2") 30G X 1/2" 0.5 ML MISC Use 2x a day 100 each 11  . lisinopril (PRINIVIL,ZESTRIL) 20 MG tablet TAKE ONE TABLET BY MOUTH EVERY DAY 90 tablet 2  . pravastatin (PRAVACHOL) 40 MG tablet TAKE ONE TABLET BY MOUTH EVERY DAY 90 tablet 0  . urea (CARMOL) 10 % cream Apply topically daily as needed. For feet 454 g 3   No current facility-administered medications on file prior to visit.    No Known Allergies  Past Medical History  Diagnosis Date  . Diabetes mellitus   . Hypertension   . Pseudogout   . Ectodermal dysplasia   . Hyperlipidemia   . Type 2 diabetes mellitus with neurological manifestations, uncontrolled 04/23/2008    Qualifier: Diagnosis of  By: Silvio Pate MD, [facility]     Past Surgical History  Procedure Laterality Date  . Appendectomy    . Rib fracture surgery  1988    right    Family  History  Problem Relation Age of Onset  . Arthritis Mother   . Arthritis Father     History   Social History  . Marital Status: Married    Spouse Name: N/A    Number of Children: N/A  . Years of Education: N/A   Occupational History  . 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 Smoking status: Never Smoker   . Smokeless tobacco: Never Used  . Alcohol Use: No  . Drug Use: No  . Sexual Activity: Not on file   Other Topics Concern  . Not on file   Social History Narrative   No living will or health care POA   Would want wife to make medical decisions for him   Would accept CPR but no prolonged ventilation   Not sure about tube feeds   Review of Systems No rash  No vomiting or diarrhea    Objective:   Physical Exam  Constitutional: He appears well-developed and well-nourished.  HENT:  No sinus tenderness Moderate nasal congestion TMs normal Slight pharyngeal injection without exudate  Neck: Normal range of motion. Neck supple. No thyromegaly present.  Pulmonary/Chest: Effort normal and breath sounds normal. No respiratory distress. He has no wheezes. He has no rales.  Lymphadenopathy:    He has no cervical adenopathy.          Assessment & Plan:

## 2014-02-24 ENCOUNTER — Other Ambulatory Visit: Payer: Self-pay | Admitting: Internal Medicine

## 2014-03-03 ENCOUNTER — Ambulatory Visit (INDEPENDENT_AMBULATORY_CARE_PROVIDER_SITE_OTHER): Payer: Medicare Other | Admitting: Internal Medicine

## 2014-03-03 ENCOUNTER — Encounter: Payer: Self-pay | Admitting: Internal Medicine

## 2014-03-03 VITALS — BP 112/66 | HR 93 | Temp 98.1°F | Resp 12 | Wt 239.8 lb

## 2014-03-03 DIAGNOSIS — E1149 Type 2 diabetes mellitus with other diabetic neurological complication: Secondary | ICD-10-CM

## 2014-03-03 DIAGNOSIS — E1141 Type 2 diabetes mellitus with diabetic mononeuropathy: Secondary | ICD-10-CM

## 2014-03-03 DIAGNOSIS — IMO0002 Reserved for concepts with insufficient information to code with codable children: Secondary | ICD-10-CM

## 2014-03-03 DIAGNOSIS — E1165 Type 2 diabetes mellitus with hyperglycemia: Principal | ICD-10-CM

## 2014-03-03 LAB — HEMOGLOBIN A1C: Hgb A1c MFr Bld: 8.1 % — ABNORMAL HIGH (ref 4.6–6.5)

## 2014-03-03 NOTE — Patient Instructions (Signed)
Please continue the following U500 doses: - 16 units on your syringe (0.16 mL) before breakfast   - 8 units on your syringe (0.08 mL) before breakfast  - 16 units on your syringe (0.16 mL) before dinner.  Please return in 3 month with your sugar log.   Please stop at the lab.

## 2014-03-03 NOTE — Progress Notes (Signed)
Patient ID: Aaron Mckenzie, male   DOB: 11-21-1960, 54 y.o.   MRN: [id number]  HPI: Aaron Mckenzie is a 54 y.o.-year-old male, returning for f/u for DM2, dx 2004, insulin-dependent since 12/2012, uncontrolled, with complications (CKD, neuropathy, DR). Last visit 2 mo ago.  Last hemoglobin A1c was: Lab Results  Component Value Date   HGBA1C 9.6* 12/01/2013   HGBA1C 8.6* 09/03/2013   HGBA1C 10.3* 05/16/2013   Pt was on a regimen of: - Lantus 60 >> 70 >> 80 units at bedtime - Novolog 30 >> 40 units bid (does not eat lunch, if he does, takes 20 units) - NovoLog 10 units with snack if it contains >15 g carbs (subtract fiber content from the total nr of carbs) We stopped Glipizide 5 mg bid when we started mealtime insulin We had to stop Metformin at last visit 2/2 decreased kidney fxn.  Pt is now on: - U500: - 16 units on your syringe (0.16 mL) before breakfast   - 8 units on your syringe (0.08 mL) before breakfast  - 16 units on your syringe (0.16 mL) before dinner.  Pt checks sugars 2-3x a day - brings log: - am: 217-260 (322) >> 202-328 >> 169-209, 246x1 (bowl of cereal ~midnight) >> 108-227, lately up to 160s - before lunch: 260 >> n/c >> 178, 195 >> 249 >> 224 >> n/c - 2h after lunch: 400, 536 >> n/c >> n/c - before dinner: 213-323 (500x1) >> 236-323 >> 81-94 if he skips lunch; 126-259 >> 76-181 - 2h after dinner: 300s >> n/c  - bedtime: 140-201 (273) >> 176-314 >> 206-310 >> 166, 219 >> n/c No lows. ? lowest sugar; he has hypoglycemia awareness but unknown how low. Highest CBG 200s   Pt's meals are: - Breakfast: eggs, meat, grits, wheat toast - Lunch: may skip or eat late b'fast  - Dinner: chicken/fish/pork chops, 2 sides, salad, baked potatoes - Snacks: 2 a day - apple slices, popcorn  - has CKD, last BUN/creatinine:  Lab Results  Component Value Date   BUN 28* 01/26/2014   CREATININE 1.7* 01/26/2014  On Lisinopril. - last set of lipids: Lab Results  Component  Value Date   CHOL 168 01/26/2014   HDL 21.30* 01/26/2014   LDLDIRECT 93.7 01/26/2014   TRIG 356.0* 01/26/2014   CHOLHDL 8 01/26/2014  On Pravastatin. On ASA 81.  - last eye exam was in 01/20/2014. + mild DR. - + numbness and tingling in his feet and hands. On neurontin 600-600-900 mg.  He also has a history of HTN, HL, pseudogout, bilateral carpal tunnel s/p sx in each hand: 1985 and 2000.  I reviewed pt's medications, allergies, PMH, social hx, family hx and no changes required, except as mentioned above.  ROS: Constitutional: no weight gain/loss, no fatigue, no subjective hyperthermia/hypothermia Eyes: no blurry vision, no xerophthalmia ENT: no sore throat, no nodules palpated in throat, no dysphagia/odynophagia, no hoarseness Cardiovascular: no CP/SOB/palpitations/+ hand and leg swelling Respiratory: no cough/SOB Gastrointestinal: no N/V/D/C Musculoskeletal: no muscle/joint aches Skin: no rashes, Neurological: no tremors/numbness/tingling/dizziness  PE: BP 112/66 mmHg  Pulse 93  Temp(Src) 98.1 F (36.7 C) (Oral)  Resp 12  Wt 239 lb 12.8 oz (108.773 kg)  SpO2 97% Wt Readings from Last 3 Encounters:  03/03/14 239 lb 12.8 oz (108.773 kg)  02/16/14 237 lb (107.502 kg)  01/26/14 238 lb (107.956 kg)   Constitutional: overweight, in NAD Eyes: PERRLA, EOMI, no exophthalmos ENT: moist mucous membranes, no thyromegaly, no cervical lymphadenopathy  Cardiovascular: RRR, No MRG, +++ pitting bilateral leg edema Respiratory: CTA B Gastrointestinal: abdomen soft, NT, ND, BS+ Musculoskeletal: no deformities, strength intact in all 4 Skin: moist, warm, no rashes  ASSESSMENT: 1. DM2, insulin-dependent, uncontrolled, with complications - PN - ? If chronic KD  PLAN:  1. Patient with long-standing, uncontrolled diabetes, with increased insulin resistance, with improving DM control after starting U500 insulin, per his log. His sugars in the ;last week were wonderful, previously had  some 200 spikes over the Holidays. Will continue current regimen for now: Patient Instructions  Please continue the U500 insulin:  - 16 units on your syringe (0.16 mL) before breakfast   - 8 units on your syringe (0.08 mL) before breakfast  - 16 units on your syringe (0.16 mL) before dinner.  Please return in 3 months with your sugar log.   Please stop at the lab.   - continue checking sugars at different times of the day - check 3 times a day, rotating checks - he is up to date with yearly eye exams >> has DR - will check HbA1c today - RTC in 3 mo   Office Visit on 03/03/2014  Component Date Value Ref Range Status  . Hgb A1c MFr Bld 03/03/2014 8.1* 4.6 - 6.5 % Final   Glycemic Control Guidelines for People with Diabetes:Non Diabetic:  <6%Goal of Therapy: <7%Additional Action Suggested:  >8%    The HbA1c has improved further!

## 2014-05-11 ENCOUNTER — Other Ambulatory Visit: Payer: Self-pay | Admitting: *Deleted

## 2014-05-11 MED ORDER — GLUCOSE BLOOD VI STRP: ORAL_STRIP | Status: DC

## 2014-05-25 ENCOUNTER — Other Ambulatory Visit: Payer: Self-pay | Admitting: Internal Medicine

## 2014-06-03 ENCOUNTER — Encounter: Payer: Self-pay | Admitting: Internal Medicine

## 2014-06-03 ENCOUNTER — Ambulatory Visit (INDEPENDENT_AMBULATORY_CARE_PROVIDER_SITE_OTHER): Payer: Medicare Other | Admitting: Internal Medicine

## 2014-06-03 VITALS — BP 114/80 | HR 97 | Temp 97.4°F | Resp 12 | Wt 241.0 lb

## 2014-06-03 DIAGNOSIS — E1141 Type 2 diabetes mellitus with diabetic mononeuropathy: Secondary | ICD-10-CM

## 2014-06-03 DIAGNOSIS — E1165 Type 2 diabetes mellitus with hyperglycemia: Principal | ICD-10-CM

## 2014-06-03 DIAGNOSIS — E1149 Type 2 diabetes mellitus with other diabetic neurological complication: Secondary | ICD-10-CM

## 2014-06-03 DIAGNOSIS — IMO0002 Reserved for concepts with insufficient information to code with codable children: Secondary | ICD-10-CM

## 2014-06-03 LAB — HEMOGLOBIN A1C: HEMOGLOBIN A1C: 7.6 % — AB (ref 4.6–6.5)

## 2014-06-03 MED ORDER — INSULIN REGULAR HUMAN (CONC) 500 UNIT/ML ~~LOC~~ SOLN: SUBCUTANEOUS | Status: DC

## 2014-06-03 NOTE — Patient Instructions (Signed)
Please increase the U500 insulin doses:  - 19 units on your syringe before breakfast   - 10 units on your syringe before breakfast  - 19 units on your syringe before dinner.  Please decrease the insulin doses by 4 units for the meal before you plan to be active.  Please return in 3 months with your sugar log.   Please stop at the lab.

## 2014-06-03 NOTE — Progress Notes (Signed)
Patient ID: Aaron Mckenzie, male   DOB: 1960-09-05, 54 y.o.   MRN: [id number]  HPI: Aaron Mckenzie is a 53 y.o.-year-old male, returning for f/u for DM2, dx 2004, insulin-dependent since 12/2012, uncontrolled, with complications (CKD, neuropathy, DR). Last visit 3 mo ago.  Last hemoglobin A1c was: Lab Results  Component Value Date   HGBA1C 8.1* 03/03/2014   HGBA1C 9.6* 12/01/2013   HGBA1C 8.6* 09/03/2013   Pt was on a regimen of: - Lantus 60 >> 70 >> 80 units at bedtime - Novolog 30 >> 40 units bid (does not eat lunch, if he does, takes 20 units) - NovoLog 10 units with snack if it contains >15 g carbs (subtract fiber content from the total nr of carbs) We stopped Glipizide 5 mg bid when we started mealtime insulin We had to stop Metformin at last visit 2/2 decreased kidney fxn.  Pt is now on: - U500: - 16 units on your syringe (0.16 mL) before breakfast   - 8 units on your syringe (0.08 mL) before breakfast  - 16 units on your syringe (0.16 mL) before dinner.  Pt checks sugars 2-3x a day - forgot log: - am: 202-328 >> 169-209, 246x1 (bowl of cereal ~midnight) >> 108-227 >> 150-197 - before lunch: 260 >> n/c >> 178, 195 >> 249 >> 224 >> n/c  - 2h after lunch: 400, 536 >> n/c >> n/c - before dinner: 213-323 (500x1) >> 236-323 >> 81-94 if he skips lunch; 126-259 >> 76-181 >> 100-170 - 2h after dinner: 300s >> n/c  - bedtime: 140-201 (273) >> 176-314 >> 206-310 >> 166, 219 >> n/c >> 100-170 + lows - afternoon. ? lowest sugar; he has hypoglycemia awareness but unknown how low. Highest CBG 200s (few).   Pt's meals are: - Breakfast: eggs, meat, grits, wheat toast - Lunch: may skip or eat late b'fast  - Dinner: chicken/fish/pork chops, 2 sides, salad, baked potatoes - Snacks: 2 a day - apple slices, popcorn  - has CKD, last BUN/creatinine:  Lab Results  Component Value Date   BUN 28* 01/26/2014   CREATININE 1.7* 01/26/2014  On Lisinopril. - last set of lipids: Lab Results   Component Value Date   CHOL 168 01/26/2014   HDL 21.30* 01/26/2014   LDLDIRECT 93.7 01/26/2014   TRIG 356.0* 01/26/2014   CHOLHDL 8 01/26/2014  On Pravastatin. On ASA 81.  - last eye exam was in 01/20/2014. + mild DR. - + numbness and tingling in his feet and hands. On neurontin 600-600-900 mg.  He also has a history of HTN, HL, pseudogout, bilateral carpal tunnel s/p sx in each hand: 1985 and 2000.  I reviewed pt's medications, allergies, PMH, social hx, family hx, and changes were documented in the history of present illness. Otherwise, unchanged from my initial visit note.  ROS: Constitutional: no weight gain/loss, no fatigue, no subjective hyperthermia/hypothermia Eyes: no blurry vision, no xerophthalmia ENT: no sore throat, no nodules palpated in throat, no dysphagia/odynophagia, no hoarseness Cardiovascular: no CP/SOB/palpitations/+ hand and leg swelling Respiratory: no cough/SOB Gastrointestinal: no N/V/D/C Musculoskeletal: no muscle/joint aches Skin: no rashes, Neurological: no tremors/numbness/tingling/dizziness  PE: BP 114/80 mmHg  Pulse 97  Temp(Src) 97.4 F (36.3 C) (Oral)  Resp 12  Wt 241 lb (109.317 kg)  SpO2 98% Wt Readings from Last 3 Encounters:  06/03/14 241 lb (109.317 kg)  03/03/14 239 lb 12.8 oz (108.773 kg)  02/16/14 237 lb (107.502 kg)   Constitutional: overweight, in NAD Eyes: PERRLA, EOMI, no  exophthalmos ENT: moist mucous membranes, no thyromegaly, no cervical lymphadenopathy Cardiovascular: RRR, No MRG, +++ pitting bilateral leg edema Respiratory: CTA B Gastrointestinal: abdomen soft, NT, ND, BS+ Musculoskeletal: no deformities, strength intact in all 4 Skin: moist, warm, no rashes  ASSESSMENT: 1. DM2, insulin-dependent, uncontrolled, with complications - PN - ? If chronic KD  PLAN:  1. Patient with long-standing, uncontrolled diabetes, with increased insulin resistance, with improving DM control after starting U500 insulin. Sugars  still not at goal >> will increase doses a little.  Patient Instructions  Please increase the U500 insulin doses:  - 19 units on your syringe before breakfast   - 10 units on your syringe before breakfast  - 19 units on your syringe before dinner.  Please decrease the insulin doses by 4 units for the meal before you plan to be active.  Please return in 3 months with your sugar log.   Please stop at the lab.   - continue checking sugars at different times of the day - check 3 times a day, rotating checks - he is up to date with yearly eye exams >> has DR - will check HbA1c today - RTC in 3 mo   Office Visit on 06/03/2014  Component Date Value Ref Range Status  . Hgb A1c MFr Bld 06/03/2014 7.6* 4.6 - 6.5 % Final   Glycemic Control Guidelines for People with Diabetes:Non Diabetic:  <6%Goal of Therapy: <7%Additional Action Suggested:  >8%    The hemoglobin A1c has improved further!

## 2014-06-05 ENCOUNTER — Other Ambulatory Visit: Payer: Self-pay | Admitting: Internal Medicine

## 2014-06-08 NOTE — Telephone Encounter (Signed)
rx sent to pharmacy by e-script  

## 2014-06-08 NOTE — Telephone Encounter (Signed)
Approved: #85 gm x 5

## 2014-06-08 NOTE — Telephone Encounter (Signed)
Ok to fill 

## 2014-06-15 DIAGNOSIS — R809 Proteinuria, unspecified: Secondary | ICD-10-CM | POA: Diagnosis not present

## 2014-06-15 DIAGNOSIS — E1122 Type 2 diabetes mellitus with diabetic chronic kidney disease: Secondary | ICD-10-CM | POA: Diagnosis not present

## 2014-06-15 DIAGNOSIS — N183 Chronic kidney disease, stage 3 (moderate): Secondary | ICD-10-CM | POA: Diagnosis not present

## 2014-06-15 DIAGNOSIS — I129 Hypertensive chronic kidney disease with stage 1 through stage 4 chronic kidney disease, or unspecified chronic kidney disease: Secondary | ICD-10-CM | POA: Diagnosis not present

## 2014-07-21 DIAGNOSIS — H40013 Open angle with borderline findings, low risk, bilateral: Secondary | ICD-10-CM | POA: Diagnosis not present

## 2014-07-21 LAB — HM DIABETES EYE EXAM

## 2014-07-23 ENCOUNTER — Encounter: Payer: Self-pay | Admitting: Internal Medicine

## 2014-07-24 ENCOUNTER — Other Ambulatory Visit: Payer: Self-pay | Admitting: Internal Medicine

## 2014-08-03 ENCOUNTER — Encounter (INDEPENDENT_AMBULATORY_CARE_PROVIDER_SITE_OTHER): Payer: Medicare Other | Admitting: Ophthalmology

## 2014-08-03 DIAGNOSIS — E11331 Type 2 diabetes mellitus with moderate nonproliferative diabetic retinopathy with macular edema: Secondary | ICD-10-CM | POA: Diagnosis not present

## 2014-08-03 DIAGNOSIS — H35033 Hypertensive retinopathy, bilateral: Secondary | ICD-10-CM | POA: Diagnosis not present

## 2014-08-03 DIAGNOSIS — H43813 Vitreous degeneration, bilateral: Secondary | ICD-10-CM

## 2014-08-03 DIAGNOSIS — H2513 Age-related nuclear cataract, bilateral: Secondary | ICD-10-CM

## 2014-08-03 DIAGNOSIS — I10 Essential (primary) hypertension: Secondary | ICD-10-CM

## 2014-08-03 DIAGNOSIS — E11311 Type 2 diabetes mellitus with unspecified diabetic retinopathy with macular edema: Secondary | ICD-10-CM | POA: Diagnosis not present

## 2014-08-14 ENCOUNTER — Encounter: Payer: Self-pay | Admitting: Internal Medicine

## 2014-08-14 ENCOUNTER — Ambulatory Visit (INDEPENDENT_AMBULATORY_CARE_PROVIDER_SITE_OTHER): Payer: Medicare Other | Admitting: Internal Medicine

## 2014-08-14 VITALS — BP 130/80 | HR 100 | Temp 98.1°F | Wt 239.0 lb

## 2014-08-14 DIAGNOSIS — M71122 Other infective bursitis, left elbow: Secondary | ICD-10-CM

## 2014-08-14 DIAGNOSIS — M7022 Olecranon bursitis, left elbow: Secondary | ICD-10-CM

## 2014-08-14 DIAGNOSIS — B9689 Other specified bacterial agents as the cause of diseases classified elsewhere: Secondary | ICD-10-CM

## 2014-08-14 MED ORDER — AMOXICILLIN-POT CLAVULANATE 875-125 MG PO TABS: 1.0000 | ORAL_TABLET | Freq: Two times a day (BID) | ORAL | Status: DC

## 2014-08-14 NOTE — Patient Instructions (Signed)
Please let me know if your elbow is not much better by Monday or Tuesday. Once the infection clears, it may be more effective to use intermittent ice (like for 10 minutes at a time)

## 2014-08-14 NOTE — Progress Notes (Signed)
Pre visit review using our clinic review tool, if applicable. No additional management support is needed unless otherwise documented below in the visit note. 

## 2014-08-14 NOTE — Progress Notes (Signed)
   Subjective:    Patient ID: Aaron Mckenzie, male    DOB: [birth year], 54 y.o.   MRN: [id number]  HPI Here with wife Having trouble with his left elbow Seems to be infected with oozing Started about 2 months ago--wasn't bad for a while---just kept it covered Tried hot compresses --helped some  Does take the colchicine daily (bid)  No fever Doesn't feel sick  Current Outpatient Prescriptions on File Prior to Visit  Medication Sig Dispense Refill  . aspirin EC 81 MG tablet Take 81 mg by mouth daily.    Marland Kitchen COLCRYS 0.6 MG tablet TAKE ONE TABLET BY MOUTH TWICE DAILY AS NEEDED 60 tablet 2  . gabapentin (NEURONTIN) 300 MG capsule Take 3 capsules (900 mg total) by mouth 3 (three) times daily. 270 capsule 11  . glucose blood test strip CHECK BLOOD SUGAR 2 TO 4 TIMES DAILY. DX CODE E11.41 125 each 2  . HUMULIN R 500 UNIT/ML SOLN injection INJECT UNDER SKIN 100 UNITS (0.20 ML) BEFORE BREAKFAST AND 80 UNITS (0.16ML) BEFORE DINNER 20 mL 1  . Insulin Syringe-Needle U-100 (B-D INS SYRINGE 0.5CC/30GX1/2") 30G X 1/2" 0.5 ML MISC Use 2x a day 100 each 11  . lisinopril (PRINIVIL,ZESTRIL) 20 MG tablet TAKE ONE TABLET BY MOUTH EVERY DAY 90 tablet 1  . pravastatin (PRAVACHOL) 40 MG tablet TAKE ONE TABLET BY MOUTH EVERY DAY 90 tablet 0  . urea (CARMOL) 10 % cream APPLY TOPICALLY DAILY AS NEEDED FOR FEET 85 g 5   No current facility-administered medications on file prior to visit.    No Known Allergies  Past Medical History  Diagnosis Date  . Diabetes mellitus   . Hypertension   . Pseudogout   . Ectodermal dysplasia   . Hyperlipidemia   . Type 2 diabetes mellitus with neurological manifestations, uncontrolled 04/23/2008    Qualifier: Diagnosis of  By: Silvio Pate MD, [facility]     Past Surgical History  Procedure Laterality Date  . Appendectomy    . Rib fracture surgery  1988    right    Family History  Problem Relation Age of Onset  . Arthritis Mother   . Arthritis Father     History     Social History  . Marital Status: Married    Spouse Name: N/A  . Number of Children: N/A  . Years of Education: N/A   Occupational History  . 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 Smoking status: Never Smoker   . Smokeless tobacco: Never Used  . Alcohol Use: No  . Drug Use: No  . Sexual Activity: Not on file   Other Topics Concern  . Not on file   Social History Narrative   No living will or health care POA   Would want wife to make medical decisions for him   Would accept CPR but no prolonged ventilation   Not sure about tube feeds   Review of Systems  Sugars have been better Sleeps okay but still has trouble with the neuropathy (gabapentin does help though)     Objective:   Physical Exam  Musculoskeletal:  Left elbow red with pus draining Some white areas separate that look non infected Some warmth but not that tender          Assessment & Plan:

## 2014-08-14 NOTE — Assessment & Plan Note (Signed)
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

## 2014-08-28 ENCOUNTER — Encounter (INDEPENDENT_AMBULATORY_CARE_PROVIDER_SITE_OTHER): Payer: Medicare Other | Admitting: Ophthalmology

## 2014-08-28 DIAGNOSIS — E11311 Type 2 diabetes mellitus with unspecified diabetic retinopathy with macular edema: Secondary | ICD-10-CM

## 2014-08-28 DIAGNOSIS — H2513 Age-related nuclear cataract, bilateral: Secondary | ICD-10-CM | POA: Diagnosis not present

## 2014-08-28 DIAGNOSIS — E11331 Type 2 diabetes mellitus with moderate nonproliferative diabetic retinopathy with macular edema: Secondary | ICD-10-CM

## 2014-08-28 DIAGNOSIS — H35033 Hypertensive retinopathy, bilateral: Secondary | ICD-10-CM

## 2014-08-28 DIAGNOSIS — I10 Essential (primary) hypertension: Secondary | ICD-10-CM | POA: Diagnosis not present

## 2014-08-28 DIAGNOSIS — H43813 Vitreous degeneration, bilateral: Secondary | ICD-10-CM | POA: Diagnosis not present

## 2014-09-04 ENCOUNTER — Ambulatory Visit (INDEPENDENT_AMBULATORY_CARE_PROVIDER_SITE_OTHER): Payer: Medicare Other | Admitting: Internal Medicine

## 2014-09-04 ENCOUNTER — Other Ambulatory Visit (INDEPENDENT_AMBULATORY_CARE_PROVIDER_SITE_OTHER): Payer: Medicare Other | Admitting: *Deleted

## 2014-09-04 ENCOUNTER — Encounter: Payer: Self-pay | Admitting: Internal Medicine

## 2014-09-04 VITALS — BP 116/72 | HR 89 | Temp 98.1°F | Resp 12 | Wt 242.0 lb

## 2014-09-04 DIAGNOSIS — E1141 Type 2 diabetes mellitus with diabetic mononeuropathy: Secondary | ICD-10-CM

## 2014-09-04 DIAGNOSIS — IMO0002 Reserved for concepts with insufficient information to code with codable children: Secondary | ICD-10-CM

## 2014-09-04 DIAGNOSIS — E1165 Type 2 diabetes mellitus with hyperglycemia: Principal | ICD-10-CM

## 2014-09-04 DIAGNOSIS — E1149 Type 2 diabetes mellitus with other diabetic neurological complication: Secondary | ICD-10-CM

## 2014-09-04 LAB — POCT GLYCOSYLATED HEMOGLOBIN (HGB A1C): Hemoglobin A1C: 6.5

## 2014-09-04 NOTE — Progress Notes (Signed)
Patient ID: Aaron Mckenzie, male   DOB: Mar 02, 1960, 54 y.o.   MRN: [id number]  HPI: Aaron Mckenzie is a 54 y.o.-year-old male, returning for f/u for DM2, dx 2004, insulin-dependent since 12/2012, uncontrolled, with complications (CKD, neuropathy, DR). Last visit 3 mo ago.  Last hemoglobin A1c was - latest result checked today before the appt: Lab Results  Component Value Date   HGBA1C 6.5 09/04/2014   HGBA1C 7.6* 06/03/2014   HGBA1C 8.1* 03/03/2014   Pt was on a regimen of: - Lantus 60 >> 70 >> 80 units at bedtime - Novolog 30 >> 40 units bid (does not eat lunch, if he does, takes 20 units) - NovoLog 10 units with snack if it contains >15 g carbs (subtract fiber content from the total nr of carbs) We stopped Glipizide 5 mg bid when we started mealtime insulin We had to stop Metformin at last visit 2/2 decreased kidney fxn.  Pt is now on: - U500: - 19 units on your syringe before breakfast   - 10 units on your syringe before lunch - 19 units on your syringe before dinner.  Pt checks sugars 2-3x a day - forgot log: - am: 202-328 >> 169-209, 246x1 (bowl of cereal ~midnight) >> 108-227 >> 150-197 >> 90, 120-188 - before lunch: 260 >> n/c >> 178, 195 >> 249 >> 224 >> n/c  - 2h after lunch: 400, 536 >> n/c >> n/c - before dinner: 213-323 >> 236-323 >> 81-94 if he skips lunch; 126-259 >> 76-181 >> 100-170 >> 73, 89, 91-168 - 2h after dinner: 300s >> n/c  - bedtime: 140-201 (273) >> 176-314 >> 206-310 >> 166, 219 >> n/c >> 100-170 >> 76x1 + lows - afternoon. ? lowest sugar; he has hypoglycemia awareness but unknown how low. Highest CBG 200s (few).   Pt's meals are: - Breakfast: eggs, meat, grits, wheat toast - Lunch: may skip or eat late b'fast  - Dinner: chicken/fish/pork chops, 2 sides, salad, baked potatoes - Snacks: 2 a day - apple slices, popcorn  - + CKD, last BUN/creatinine:  Lab Results  Component Value Date   BUN 28* 01/26/2014   CREATININE 1.7* 01/26/2014  On  Lisinopril. - last set of lipids: Lab Results  Component Value Date   CHOL 168 01/26/2014   HDL 21.30* 01/26/2014   LDLDIRECT 93.7 01/26/2014   TRIG 356.0* 01/26/2014   CHOLHDL 8 01/26/2014  On Pravastatin. On ASA 81.  - last eye exam was in 01/20/2014. + DR. - + numbness and tingling in his feet and hands. On neurontin 600-600-900 mg.  He also has a history of HTN, HL, pseudogout, bilateral carpal tunnel s/p sx in each hand: 1985 and 2000.  I reviewed pt's medications, allergies, PMH, social hx, family hx, and changes were documented in the history of present illness. Otherwise, unchanged from my initial visit note.  ROS: Constitutional: no weight gain/loss, no fatigue, no subjective hyperthermia/hypothermia, + poor sleep Eyes: n+ blurry vision, no xerophthalmia ENT: no sore throat, no nodules palpated in throat, no dysphagia/odynophagia, no hoarseness Cardiovascular: no CP/SOB/palpitations/+ hand and leg swelling Respiratory: no cough/SOB Gastrointestinal: no N/V/D/C Musculoskeletal: no muscle/+ joint aches Skin: no rashes, Neurological: no tremors/numbness/tingling/dizziness  PE: BP 116/72 mmHg  Pulse 89  Temp(Src) 98.1 F (36.7 C) (Oral)  Resp 12  Wt 242 lb (109.77 kg)  SpO2 97% Body mass index is 35.72 kg/(m^2). Wt Readings from Last 3 Encounters:  09/04/14 242 lb (109.77 kg)  08/14/14 239 lb (108.41 kg)  06/03/14 241 lb (109.317 kg)   Constitutional: overweight, in NAD Eyes: PERRLA, EOMI, no exophthalmos ENT: moist mucous membranes, no thyromegaly, no cervical lymphadenopathy Cardiovascular: RRR, No MRG, ++ pitting bilateral leg edema Respiratory: CTA B Gastrointestinal: abdomen soft, NT, ND, BS+ Musculoskeletal: no deformities, strength intact in all 4 Skin: moist, warm, no rashes  ASSESSMENT: 1. DM2, insulin-dependent, uncontrolled, with complications - PN - ? If chronic KD  PLAN:  1. Patient with long-standing, uncontrolled diabetes, with increased  insulin resistance, with improving DM control after starting U500 insulin. Sugars close goal >> continue the same doses of U500 for now:  Patient Instructions  Please continue the U500 insulin doses:  - 19 units on your syringe before breakfast   - 10 units on your syringe before lunch - 19 units on your syringe before dinner.  Please decrease the insulin doses by 4 units for the meal before you plan to be active.  Please return in 3 months with your sugar log.    - continue checking sugars at different times of the day - check 3 times a day, rotating checks - he is up to date with yearly eye exams >> has DR >> sees Dr Zigmund Daniel >> getting eye injections - will check HbA1c today >> 6.5% (improved!) - RTC in 3 mo

## 2014-09-04 NOTE — Patient Instructions (Signed)
Please continue the U500 insulin doses:  - 19 units on your syringe before breakfast   - 10 units on your syringe before lunch - 19 units on your syringe before dinner.  Please decrease the insulin doses by 4-5 units for the meal before you plan to be active.  Please return in 3 months with your sugar log.

## 2014-09-24 ENCOUNTER — Encounter (INDEPENDENT_AMBULATORY_CARE_PROVIDER_SITE_OTHER): Payer: Medicare Other | Admitting: Ophthalmology

## 2014-09-24 DIAGNOSIS — H2513 Age-related nuclear cataract, bilateral: Secondary | ICD-10-CM | POA: Diagnosis not present

## 2014-09-24 DIAGNOSIS — H43813 Vitreous degeneration, bilateral: Secondary | ICD-10-CM | POA: Diagnosis not present

## 2014-09-24 DIAGNOSIS — E11311 Type 2 diabetes mellitus with unspecified diabetic retinopathy with macular edema: Secondary | ICD-10-CM

## 2014-09-24 DIAGNOSIS — H35033 Hypertensive retinopathy, bilateral: Secondary | ICD-10-CM | POA: Diagnosis not present

## 2014-09-24 DIAGNOSIS — I10 Essential (primary) hypertension: Secondary | ICD-10-CM | POA: Diagnosis not present

## 2014-09-24 DIAGNOSIS — E11339 Type 2 diabetes mellitus with moderate nonproliferative diabetic retinopathy without macular edema: Secondary | ICD-10-CM | POA: Diagnosis not present

## 2014-10-01 ENCOUNTER — Other Ambulatory Visit: Payer: Self-pay | Admitting: Internal Medicine

## 2014-10-01 ENCOUNTER — Telehealth: Payer: Self-pay | Admitting: *Deleted

## 2014-10-01 MED ORDER — GLUCOSE BLOOD VI STRP: ORAL_STRIP | Status: DC

## 2014-10-01 NOTE — Telephone Encounter (Signed)
This is what I have last: Please continue the U500 insulin doses:  - 19 units on your syringe before breakfast  - 10 units on your syringe before lunch - 19 units on your syringe before dinner.  That would be: 95 units before b'fast, 50 units before lunch and 95 units before dinner - U100 equivalent doses.  But may need to check with the pt if he changed the doses since last visit 1 mo ago.

## 2014-10-01 NOTE — Telephone Encounter (Signed)
Resent a new refill to pharmacy with dosage changes.

## 2014-10-01 NOTE — Telephone Encounter (Signed)
Chris from [employer] the Humulin r and test strips rx is awaiting approval from [place], can we do this or not? # [phone]

## 2014-10-01 NOTE — Telephone Encounter (Signed)
Pt's pharmacy called and questioned pt's refill for U500 dose. Refill was based on last OV note. Please review and advise if there needs to be a correction. Thank you.

## 2014-10-03 ENCOUNTER — Other Ambulatory Visit: Payer: Self-pay | Admitting: Internal Medicine

## 2014-10-15 ENCOUNTER — Other Ambulatory Visit (INDEPENDENT_AMBULATORY_CARE_PROVIDER_SITE_OTHER): Payer: Medicare Other | Admitting: Ophthalmology

## 2014-10-15 DIAGNOSIS — E11311 Type 2 diabetes mellitus with unspecified diabetic retinopathy with macular edema: Secondary | ICD-10-CM

## 2014-10-15 DIAGNOSIS — E11331 Type 2 diabetes mellitus with moderate nonproliferative diabetic retinopathy with macular edema: Secondary | ICD-10-CM

## 2014-10-29 ENCOUNTER — Encounter (INDEPENDENT_AMBULATORY_CARE_PROVIDER_SITE_OTHER): Payer: Medicare Other | Admitting: Ophthalmology

## 2014-10-29 DIAGNOSIS — H43813 Vitreous degeneration, bilateral: Secondary | ICD-10-CM | POA: Diagnosis not present

## 2014-10-29 DIAGNOSIS — E11319 Type 2 diabetes mellitus with unspecified diabetic retinopathy without macular edema: Secondary | ICD-10-CM | POA: Diagnosis not present

## 2014-10-29 DIAGNOSIS — I10 Essential (primary) hypertension: Secondary | ICD-10-CM

## 2014-10-29 DIAGNOSIS — H35033 Hypertensive retinopathy, bilateral: Secondary | ICD-10-CM | POA: Diagnosis not present

## 2014-10-29 DIAGNOSIS — E11331 Type 2 diabetes mellitus with moderate nonproliferative diabetic retinopathy with macular edema: Secondary | ICD-10-CM | POA: Diagnosis not present

## 2014-11-03 ENCOUNTER — Other Ambulatory Visit: Payer: Self-pay | Admitting: Internal Medicine

## 2014-12-07 ENCOUNTER — Ambulatory Visit: Payer: Medicare Other | Admitting: Internal Medicine

## 2014-12-17 ENCOUNTER — Encounter (INDEPENDENT_AMBULATORY_CARE_PROVIDER_SITE_OTHER): Payer: Medicare Other | Admitting: Ophthalmology

## 2014-12-24 ENCOUNTER — Other Ambulatory Visit (INDEPENDENT_AMBULATORY_CARE_PROVIDER_SITE_OTHER): Payer: Medicare Other | Admitting: *Deleted

## 2014-12-24 ENCOUNTER — Encounter: Payer: Self-pay | Admitting: Internal Medicine

## 2014-12-24 ENCOUNTER — Ambulatory Visit (INDEPENDENT_AMBULATORY_CARE_PROVIDER_SITE_OTHER): Payer: Medicare Other | Admitting: Internal Medicine

## 2014-12-24 VITALS — BP 118/68 | HR 87 | Temp 97.7°F | Resp 12 | Wt 244.0 lb

## 2014-12-24 DIAGNOSIS — E1165 Type 2 diabetes mellitus with hyperglycemia: Secondary | ICD-10-CM

## 2014-12-24 DIAGNOSIS — E1149 Type 2 diabetes mellitus with other diabetic neurological complication: Secondary | ICD-10-CM

## 2014-12-24 DIAGNOSIS — E1142 Type 2 diabetes mellitus with diabetic polyneuropathy: Secondary | ICD-10-CM | POA: Diagnosis not present

## 2014-12-24 DIAGNOSIS — IMO0002 Reserved for concepts with insufficient information to code with codable children: Secondary | ICD-10-CM

## 2014-12-24 DIAGNOSIS — Z794 Long term (current) use of insulin: Secondary | ICD-10-CM | POA: Diagnosis not present

## 2014-12-24 LAB — POCT GLYCOSYLATED HEMOGLOBIN (HGB A1C): Hemoglobin A1C: 6.6

## 2014-12-24 MED ORDER — INSULIN REGULAR HUMAN (CONC) 500 UNIT/ML ~~LOC~~ SOLN: SUBCUTANEOUS | Status: DC

## 2014-12-24 NOTE — Progress Notes (Signed)
Patient ID: Aaron Mckenzie, male   DOB: Jul 19, 1960, 54 y.o.   MRN: [id number]  HPI: Aaron Mckenzie is a 54 y.o.-year-old male, returning for f/u for DM2, dx 2004, insulin-dependent since 12/2012, uncontrolled, with complications (CKD, neuropathy, DR). Last visit 3 mo ago.  Last hemoglobin A1c was:  Lab Results  Component Value Date   HGBA1C 6.5 09/04/2014   HGBA1C 7.6* 06/03/2014   HGBA1C 8.1* 03/03/2014   Pt was on a regimen of: - Lantus 60 >> 70 >> 80 units at bedtime - Novolog 30 >> 40 units bid (does not eat lunch, if he does, takes 20 units) - NovoLog 10 units with snack if it contains >15 g carbs (subtract fiber content from the total nr of carbs) We stopped Glipizide 5 mg bid when we started mealtime insulin We had to stop Metformin at last visit 2/2 decreased kidney fxn.  Pt is now on: - U500: - 19 units on your syringe before breakfast   - 10 units on your syringe before lunch - 19 units on your syringe before dinner.  Pt checks sugars 2-3x a day - forgot log: - am: 169-209, 246x1 (bowl of cereal ~midnight) >> 108-227 >> 150-197 >> 90, 120-188 >> 140, 157-247 - before lunch: 260 >> n/c >> 178, 195 >> 249 >> 224 >> n/c >> 84-162 - 2h after lunch: 400, 536 >> n/c  - before dinner: 81-94 if he skips lunch; 126-259 >> 76-181 >> 100-170 >> 73, 89, 91-168 >> 123, 144-211 - 2h after dinner: 300s >> n/c  - bedtime: 140-201 (273) >> 176-314 >> 206-310 >> 166, 219 >> n/c >> 100-170 >> 76x1 >> 109-135, 229 + lows - afternoon. ? lowest sugar; he has hypoglycemia awareness but unknown how low. Highest CBG 200s (few).   Pt's meals are: - Breakfast: eggs, meat, grits, wheat toast - Lunch: may skip or eat late b'fast  - Dinner: chicken/fish/pork chops, 2 sides, salad, baked potatoes - Snacks: 2 a day - apple slices, popcorn  - + CKD, last BUN/creatinine:  Lab Results  Component Value Date   BUN 28* 01/26/2014   CREATININE 1.7* 01/26/2014  On Lisinopril. - last set of  lipids: Lab Results  Component Value Date   CHOL 168 01/26/2014   HDL 21.30* 01/26/2014   LDLDIRECT 93.7 01/26/2014   TRIG 356.0* 01/26/2014   CHOLHDL 8 01/26/2014  On Pravastatin. On ASA 81.  - last eye exam was in 07/21/2014. + DR. - + numbness and tingling in his feet and hands. On neurontin 600-600-900 mg.  He also has a history of HTN, HL, pseudogout, bilateral carpal tunnel s/p sx in each hand: 1985 and 2000.  I reviewed pt's medications, allergies, PMH, social hx, family hx, and changes were documented in the history of present illness. Otherwise, unchanged from my initial visit note.  ROS: Constitutional: + weight gain, no fatigue, no subjective hyperthermia/hypothermia Eyes: no blurry vision, no xerophthalmia ENT: no sore throat, no nodules palpated in throat, no dysphagia/odynophagia, no hoarseness Cardiovascular: no CP/SOB/palpitations/+ hand and leg swelling Respiratory: no cough/SOB Gastrointestinal: no N/V/D/C Musculoskeletal: no muscle/joint aches Skin: no rashes, Neurological: no tremors/numbness/tingling/dizziness  PE: BP 118/68 mmHg  Pulse 87  Temp(Src) 97.7 F (36.5 C) (Oral)  Resp 12  Wt 244 lb (110.678 kg)  SpO2 98% Body mass index is 36.02 kg/(m^2). Wt Readings from Last 3 Encounters:  12/24/14 244 lb (110.678 kg)  09/04/14 242 lb (109.77 kg)  08/14/14 239 lb (108.41 kg)  Constitutional: overweight, in NAD Eyes: PERRLA, EOMI, no exophthalmos ENT: moist mucous membranes, no thyromegaly, no cervical lymphadenopathy Cardiovascular: RRR, No MRG, ++ pitting bilateral leg edema Respiratory: CTA B Gastrointestinal: abdomen soft, NT, ND, BS+ Musculoskeletal: no deformities, strength intact in all 4 Skin: moist, warm, no rashes  ASSESSMENT: 1. DM2, insulin-dependent, uncontrolled, with complications - PN - ? If chronic KD  PLAN:  1. Patient with long-standing, uncontrolled diabetes, with increased insulin resistance, with improving DM control  after starting U500 insulin. Last HbA1c was great, at 6.5%.Today, it is 6.6%. Sugars are higher, though and fluctuating >> will increase the U500 insulin as follows:  Patient Instructions  Please change the U500 insulin doses as follows:  Breakfast: - 19 units for a regular meal - 22 units for a larger meal  Lunch: - 12 units for a regular meal - 15 units for a larger meal  Dinner: - 19 units for a regular meal - 22 units for a larger meal  Please decrease the insulin doses by 4 units for the meal before you plan to be active.  Please return in 3 months with your sugar log.    - continue checking sugars at different times of the day - check 3 times a day, rotating checks - he is up to date with yearly eye exams >> has DR >> sees Dr Zigmund Daniel  - will have Lipids and CMP checked at next appt with PCP in 01/2015. - refuses flu shot - RTC in 3 mo

## 2014-12-24 NOTE — Patient Instructions (Signed)
Please change the U500 insulin doses as follows:  Breakfast: - 19 units for a regular meal - 22 units for a larger meal  Lunch: - 12 units for a regular meal - 15 units for a larger meal  Dinner: - 19 units for a regular meal - 22 units for a larger meal  Please decrease the insulin doses by 4 units for the meal before you plan to be active.  Please return in 3 months with your sugar log.

## 2014-12-30 ENCOUNTER — Other Ambulatory Visit: Payer: Self-pay | Admitting: Internal Medicine

## 2015-01-27 ENCOUNTER — Other Ambulatory Visit: Payer: Self-pay | Admitting: Internal Medicine

## 2015-01-28 ENCOUNTER — Telehealth: Payer: Self-pay | Admitting: Internal Medicine

## 2015-01-28 MED ORDER — "INSULIN SYRINGE-NEEDLE U-100 30G X 1/2"" 0.5 ML MISC": Status: DC

## 2015-01-28 NOTE — Telephone Encounter (Signed)
Dorian Pod from [facility]  Pharmacist requesting clarification on pen needle order

## 2015-01-28 NOTE — Telephone Encounter (Signed)
Correct insulin pens sent in.

## 2015-01-29 ENCOUNTER — Encounter: Payer: Self-pay | Admitting: Internal Medicine

## 2015-01-29 ENCOUNTER — Ambulatory Visit (INDEPENDENT_AMBULATORY_CARE_PROVIDER_SITE_OTHER): Payer: Medicare Other | Admitting: Internal Medicine

## 2015-01-29 VITALS — BP 130/70 | HR 85 | Temp 97.8°F | Ht 69.0 in | Wt 246.0 lb

## 2015-01-29 DIAGNOSIS — Z1211 Encounter for screening for malignant neoplasm of colon: Secondary | ICD-10-CM

## 2015-01-29 DIAGNOSIS — M1189 Other specified crystal arthropathies, multiple sites: Secondary | ICD-10-CM

## 2015-01-29 DIAGNOSIS — E1149 Type 2 diabetes mellitus with other diabetic neurological complication: Secondary | ICD-10-CM

## 2015-01-29 DIAGNOSIS — N183 Chronic kidney disease, stage 3 unspecified: Secondary | ICD-10-CM

## 2015-01-29 DIAGNOSIS — E1122 Type 2 diabetes mellitus with diabetic chronic kidney disease: Secondary | ICD-10-CM | POA: Diagnosis not present

## 2015-01-29 DIAGNOSIS — E785 Hyperlipidemia, unspecified: Secondary | ICD-10-CM

## 2015-01-29 DIAGNOSIS — R351 Nocturia: Secondary | ICD-10-CM | POA: Diagnosis not present

## 2015-01-29 DIAGNOSIS — I10 Essential (primary) hypertension: Secondary | ICD-10-CM

## 2015-01-29 DIAGNOSIS — Z0000 Encounter for general adult medical examination without abnormal findings: Secondary | ICD-10-CM

## 2015-01-29 LAB — CBC WITH DIFFERENTIAL/PLATELET
BASOS ABS: 0 10*3/uL (ref 0.0–0.1)
Basophils Relative: 0.4 % (ref 0.0–3.0)
EOS ABS: 0.5 10*3/uL (ref 0.0–0.7)
Eosinophils Relative: 5.4 % — ABNORMAL HIGH (ref 0.0–5.0)
HCT: 39.6 % (ref 39.0–52.0)
HEMOGLOBIN: 12.8 g/dL — AB (ref 13.0–17.0)
Lymphocytes Relative: 18.7 % (ref 12.0–46.0)
Lymphs Abs: 1.7 10*3/uL (ref 0.7–4.0)
MCHC: 32.3 g/dL (ref 30.0–36.0)
MCV: 83.3 fl (ref 78.0–100.0)
MONO ABS: 0.5 10*3/uL (ref 0.1–1.0)
Monocytes Relative: 6 % (ref 3.0–12.0)
Neutro Abs: 6.3 10*3/uL (ref 1.4–7.7)
Neutrophils Relative %: 69.5 % (ref 43.0–77.0)
Platelets: 230 10*3/uL (ref 150.0–400.0)
RBC: 4.76 Mil/uL (ref 4.22–5.81)
RDW: 14.5 % (ref 11.5–15.5)
WBC: 9.1 10*3/uL (ref 4.0–10.5)

## 2015-01-29 LAB — RENAL FUNCTION PANEL
Albumin: 3.9 g/dL (ref 3.5–5.2)
BUN: 32 mg/dL — ABNORMAL HIGH (ref 6–23)
CALCIUM: 10 mg/dL (ref 8.4–10.5)
CHLORIDE: 105 meq/L (ref 96–112)
CO2: 24 meq/L (ref 19–32)
CREATININE: 1.58 mg/dL — AB (ref 0.40–1.50)
GFR: 48.3 mL/min — AB (ref >60.00)
Glucose, Bld: 156 mg/dL — ABNORMAL HIGH (ref 70–99)
Phosphorus: 3.5 mg/dL (ref 2.3–4.6)
Potassium: 4.3 mEq/L (ref 3.5–5.1)
Sodium: 139 mEq/L (ref 135–145)

## 2015-01-29 LAB — LIPID PANEL
CHOL/HDL RATIO: 8
Cholesterol: 201 mg/dL — ABNORMAL HIGH (ref 0–200)
HDL: 24.3 mg/dL — AB (ref >39.00)
NONHDL: 176.63
Triglycerides: 327 mg/dL — ABNORMAL HIGH (ref 0.0–149.0)
VLDL: 65.4 mg/dL — AB (ref 0.0–40.0)

## 2015-01-29 LAB — LDL CHOLESTEROL, DIRECT: Direct LDL: 122 mg/dL

## 2015-01-29 LAB — HM DIABETES FOOT EXAM

## 2015-01-29 LAB — T4, FREE: FREE T4: 0.83 ng/dL (ref 0.60–1.60)

## 2015-01-29 LAB — PSA: PSA: 1.54 ng/mL (ref 0.10–4.00)

## 2015-01-29 MED ORDER — COLCHICINE 0.6 MG PO TABS: 0.6000 mg | ORAL_TABLET | Freq: Two times a day (BID) | ORAL | Status: DC

## 2015-01-29 MED ORDER — PRAVASTATIN SODIUM 40 MG PO TABS: 40.0000 mg | ORAL_TABLET | Freq: Every day | ORAL | Status: DC

## 2015-01-29 NOTE — Progress Notes (Signed)
Subjective:    Patient ID: Aaron Mckenzie, male    DOB: [birth year], 54 y.o.   MRN: [id number]  HPI Here with wife for Medicare wellness and follow up of chronic medical conditions Reviewed form and advanced directives Reviewed other doctors No alcohol or tobacco Vision and hearing are okay No falls No depression or anhedonia Tries to walk regularly Independent with instrumental ADLs No apparent cognitive problems  Working with Dr Artemio Aly A1c down to 6.6% Still with feet numbness. Some pain which worsens in evening. Gabapentin "manages it" but sedates him Discussed using less during the day UTD on eye exam  Having hand pain lately Most MCPs and PIPs Some locking of joints as well Ran out of the colchicine Want to avoid NSAIDs due to CKD  No chest pain No SOB No palpitations  No dizziness or syncope Mild chronic edema--- better in AM  Hasn't been taking the pravastatin either  Current Outpatient Prescriptions on File Prior to Visit  Medication Sig Dispense Refill  . aspirin EC 81 MG tablet Take 81 mg by mouth daily.    Marland Kitchen gabapentin (NEURONTIN) 300 MG capsule Take 3 capsules (900 mg total) by mouth 3 (three) times daily. 270 capsule 11  . glucose blood (BAYER CONTOUR NEXT TEST) test strip Use to test blood sugar 2 to 4 times daily as instructed. Dx: E11.41 125 each 5  . glucose blood test strip CHECK BLOOD SUGAR 2 TO 4 TIMES DAILY. DX CODE E11.41 125 each 2  . insulin regular human CONCENTRATED (HUMULIN R) 500 UNIT/ML injection Inject under the skin 12-22 units before meals, 3x a day, as advised 20 mL 2  . Insulin Syringe-Needle U-100 (B-D INS SYRINGE 0.5CC/30GX1/2") 30G X 1/2" 0.5 ML MISC Use 2x a day 100 each 5  . lisinopril (PRINIVIL,ZESTRIL) 20 MG tablet TAKE 1 TABLET BY MOUTH EVERY DAY 90 tablet 0  . urea (CARMOL) 10 % cream APPLY TOPICALLY DAILY AS NEEDED FOR FEET 85 g 5   No current facility-administered medications on file prior to visit.    No Known  Allergies  Past Medical History  Diagnosis Date  . Diabetes mellitus   . Hypertension   . Pseudogout   . Ectodermal dysplasia   . Hyperlipidemia   . Type 2 diabetes mellitus with neurological manifestations, uncontrolled ([place]) 04/23/2008    Qualifier: Diagnosis of  By: Silvio Pate MD, [facility]     Past Surgical History  Procedure Laterality Date  . Appendectomy    . Rib fracture surgery  1988    right    Family History  Problem Relation Age of Onset  . Arthritis Mother   . Arthritis Father     Social History   Social History  . Marital Status: Married    Spouse Name: N/A  . Number of Children: N/A  . Years of Education: N/A   Occupational History  . 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 Smoking status: Never Smoker   . Smokeless tobacco: Never Used  . Alcohol Use: No  . Drug Use: No  . Sexual Activity: Not on file   Other Topics Concern  . Not on file   Social History Narrative   No living will or health care POA   Would want wife to make medical decisions for him   Would accept CPR but no prolonged ventilation   Not sure about tube feeds   Review  of Systems Wears seat belt Appetite is good Weight is about stable Full dentures Bowels are fine Voids okay-- nocturia x 1 No rash or suspicious skin lesions Chronic bursa thickening on right elbow > left elbow    Objective:   Physical Exam  Constitutional: He is oriented to person, place, and time. He appears well-developed and well-nourished. No distress.  HENT:  Mouth/Throat: Oropharynx is clear and moist. No oropharyngeal exudate.  Neck: Normal range of motion. Neck supple. No thyromegaly present.  Cardiovascular: Normal rate, regular rhythm, normal heart sounds and intact distal pulses.  Exam reveals no gallop.   No murmur heard. Pulmonary/Chest: Effort normal and breath sounds normal. No respiratory distress. He has no wheezes. He has no rales.    Abdominal: Soft. There is no tenderness.  Musculoskeletal:  1-2+ edema in feet/ankles Marked tophaceous changes right elbow--mild on left Thickening of all MCP/PIP in hands  Lymphadenopathy:    He has no cervical adenopathy.  Neurological: He is alert and oriented to person, place, and time.  President-- "Aaron Mckenzie, [place]" [phone] [id number] Recall 3/3  Decreased sensation in feet  Skin:  Scaling on feet without callous or ulcers Mycotic toenails on right (will go to podiatrist)          Assessment & Plan:

## 2015-01-29 NOTE — Assessment & Plan Note (Signed)
Urged him to restart the statin

## 2015-01-29 NOTE — Assessment & Plan Note (Signed)
Has done really well with Dr De Blanch Now reasonable control Discussed adjusting the gabapentin to avoid sedation

## 2015-01-29 NOTE — Assessment & Plan Note (Signed)
On ACE-I 

## 2015-01-29 NOTE — Assessment & Plan Note (Signed)
BP Readings from Last 3 Encounters:  01/29/15 130/70  12/24/14 118/68  09/04/14 116/72   Good control No changes needed

## 2015-01-29 NOTE — Progress Notes (Signed)
Pre visit review using our clinic review tool, if applicable. No additional management support is needed unless otherwise documented below in the visit note. 

## 2015-01-29 NOTE — Assessment & Plan Note (Addendum)
I have personally reviewed the Medicare Annual Wellness questionnaire and have noted 1. The patient's medical and social history 2. Their use of alcohol, tobacco or illicit drugs 3. Their current medications and supplements 4. The patient's functional ability including ADL's, fall risks, home safety risks and hearing or visual             impairment. 5. Diet and physical activities 6. Evidence for depression or mood disorders  The patients weight, height, BMI and visual acuity have been recorded in the chart I have made referrals, counseling and provided education to the patient based review of the above and I have provided the pt with a written personalized care plan for preventive services.  I have provided you with a copy of your personalized plan for preventive services. Please take the time to review along with your updated medication list.  Doesn't want flu vaccine Will check PSA Fecal immunoassay  Discussed exercise

## 2015-01-29 NOTE — Assessment & Plan Note (Signed)
Highly symptomatic Urged him to restart the colchicine

## 2015-01-29 NOTE — Assessment & Plan Note (Signed)
On lisinopril Will recheck labs 

## 2015-01-29 NOTE — Patient Instructions (Signed)
Please try the gabapentin just 1 in the morning--add another after lunch if needed. Then you can use 3-4 or even 5 at night.

## 2015-02-08 ENCOUNTER — Other Ambulatory Visit: Payer: Self-pay | Admitting: Internal Medicine

## 2015-02-24 ENCOUNTER — Other Ambulatory Visit: Payer: Self-pay | Admitting: Internal Medicine

## 2015-02-24 NOTE — Telephone Encounter (Signed)
rx sent to pharmacy by e-script  

## 2015-02-24 NOTE — Telephone Encounter (Signed)
Approved: #[id number] 3

## 2015-02-24 NOTE — Telephone Encounter (Signed)
Ok at this quantity?

## 2015-03-17 DIAGNOSIS — B351 Tinea unguium: Secondary | ICD-10-CM | POA: Diagnosis not present

## 2015-03-17 DIAGNOSIS — Z794 Long term (current) use of insulin: Secondary | ICD-10-CM | POA: Diagnosis not present

## 2015-03-17 DIAGNOSIS — E1140 Type 2 diabetes mellitus with diabetic neuropathy, unspecified: Secondary | ICD-10-CM | POA: Diagnosis not present

## 2015-03-25 ENCOUNTER — Ambulatory Visit (INDEPENDENT_AMBULATORY_CARE_PROVIDER_SITE_OTHER): Payer: Medicare Other | Admitting: Internal Medicine

## 2015-03-25 ENCOUNTER — Other Ambulatory Visit (INDEPENDENT_AMBULATORY_CARE_PROVIDER_SITE_OTHER): Payer: Medicare Other | Admitting: *Deleted

## 2015-03-25 ENCOUNTER — Encounter: Payer: Self-pay | Admitting: Internal Medicine

## 2015-03-25 VITALS — BP 122/72 | HR 93 | Temp 98.2°F | Resp 12 | Wt 247.6 lb

## 2015-03-25 DIAGNOSIS — E1165 Type 2 diabetes mellitus with hyperglycemia: Secondary | ICD-10-CM | POA: Diagnosis not present

## 2015-03-25 DIAGNOSIS — Z794 Long term (current) use of insulin: Secondary | ICD-10-CM | POA: Diagnosis not present

## 2015-03-25 DIAGNOSIS — E1142 Type 2 diabetes mellitus with diabetic polyneuropathy: Secondary | ICD-10-CM | POA: Diagnosis not present

## 2015-03-25 DIAGNOSIS — IMO0002 Reserved for concepts with insufficient information to code with codable children: Secondary | ICD-10-CM | POA: Insufficient documentation

## 2015-03-25 DIAGNOSIS — E1149 Type 2 diabetes mellitus with other diabetic neurological complication: Secondary | ICD-10-CM | POA: Diagnosis not present

## 2015-03-25 LAB — POCT GLYCOSYLATED HEMOGLOBIN (HGB A1C): Hemoglobin A1C: 6.5

## 2015-03-25 NOTE — Progress Notes (Signed)
Patient ID: Aaron Mckenzie, male   DOB: April 24, 1960, 55 y.o.   MRN: [id number]  HPI: Aaron Mckenzie is a 55 y.o.-year-old male, returning for f/u for DM2, dx 2004, insulin-dependent since 12/2012, uncontrolled, with complications (CKD, neuropathy, DR). Last visit 3 mo ago.  Last hemoglobin A1c was:  Lab Results  Component Value Date   HGBA1C 6.6 12/24/2014   HGBA1C 6.5 09/04/2014   HGBA1C 7.6* 06/03/2014   Pt was on a regimen of: - Lantus 60 >> 70 >> 80 units at bedtime - Novolog 30 >> 40 units bid (does not eat lunch, if he does, takes 20 units) - NovoLog 10 units with snack if it contains >15 g carbs (subtract fiber content from the total nr of carbs) We stopped Glipizide 5 mg bid when we started mealtime insulin We had to stop Metformin at last visit 2/2 decreased kidney fxn.  Pt is now on: - U500: Breakfast: - 18 units   Lunch: - 12 units for a regular meal - 15 units for a larger meal  Dinner: - 19 units for a regular meal - 22 units for a larger meal  Pt checks sugars 2-3x a day - reviewed log: - am: 169-209, 246x1 (bowl of cereal ~midnight) >> 108-227 >> 150-197 >> 90, 120-188 >> 140, 157-247 >> 95-145, 200 - before lunch: 260 >> n/c >> 178, 195 >> 249 >> 224 >> n/c >> 84-162 >> 87-115 - 2h after lunch: 400, 536 >> n/c  >> 197 - before dinner: 81-94 if he skips lunch; 126-259 >> 76-181 >> 100-170 >> 73, 89, 91-168 >> 123, 144-211 >> 58, 68, 90-204 - 2h after dinner: 300s >> n/c  - bedtime: 140-201 (273) >> 176-314 >> 206-310 >> 166, 219 >> n/c >> 100-170 >> 76x1 >> 109-135, 229 >> 54-118 + lows - at night; he has hypoglycemia awareness but unknown how low. Highest CBG 200s (few).   Pt's meals are: - Breakfast: eggs, meat, grits, wheat toast - Lunch: may skip or eat late b'fast  - Dinner: chicken/fish/pork chops, 2 sides, salad, baked potatoes - Snacks: 2 a day - apple slices, popcorn  - + CKD, last BUN/creatinine:  Lab Results  Component Value Date   BUN  32* 01/29/2015   CREATININE 1.58* 01/29/2015  On Lisinopril. - last set of lipids: Lab Results  Component Value Date   CHOL 201* 01/29/2015   HDL 24.30* 01/29/2015   LDLDIRECT 122.0 01/29/2015   TRIG 327.0* 01/29/2015   CHOLHDL 8 01/29/2015  On Pravastatin. On ASA 81.  - last eye exam was in 07/21/2014. + DR. - + numbness and tingling in his feet and hands. On neurontin 600-600-900 mg.  He also has a history of HTN, HL, pseudogout, bilateral carpal tunnel s/p sx in each hand: 1985 and 2000.  I reviewed pt's medications, allergies, PMH, social hx, family hx, and changes were documented in the history of present illness. Otherwise, unchanged from my initial visit note.  ROS: Constitutional: no  weight gain, no fatigue, no subjective hyperthermia/hypothermia Eyes: no blurry vision, no xerophthalmia ENT: no sore throat, no nodules palpated in throat, no dysphagia/odynophagia, no hoarseness Cardiovascular: no CP/SOB/palpitations/+ hand and leg swelling Respiratory: + cough/no SOB Gastrointestinal: no N/V/D/C Musculoskeletal: no muscle/joint aches Skin: no rashes, Neurological: no tremors/numbness/tingling/dizziness  PE: BP 122/72 mmHg  Pulse 93  Temp(Src) 98.2 F (36.8 C) (Oral)  Resp 12  Wt 247 lb 9.6 oz (112.311 kg)  SpO2 95% Body mass index is 36.55 kg/(m^2).  Wt Readings from Last 3 Encounters:  03/25/15 247 lb 9.6 oz (112.311 kg)  01/29/15 246 lb (111.585 kg)  12/24/14 244 lb (110.678 kg)   Constitutional: overweight, in NAD Eyes: PERRLA, EOMI, no exophthalmos ENT: moist mucous membranes, no thyromegaly, no cervical lymphadenopathy Cardiovascular: RRR, No MRG, ++ pitting bilateral leg edema Respiratory: CTA B Gastrointestinal: abdomen soft, NT, ND, BS+ Musculoskeletal: no deformities, strength intact in all 4 Skin: moist, warm, no rashes  ASSESSMENT: 1. DM2, insulin-dependent, uncontrolled, with complications - PN - ? If chronic KD  PLAN:  1. Patient with  long-standing, uncontrolled diabetes, with increased insulin resistance, with improving DM control after starting U500 insulin >> now lows at night. Will decrease insulin with dinner. Advised him and wife to call with any more lows. - I advised him to:  Patient Instructions  Please continue the U500 insulin doses as follows (decrease insulin with dinner):  Breakfast: - 19 units for a regular meal - 22 units for a larger meal  Lunch: - 12 units for a regular meal - 15 units for a larger meal  Dinner: - 19 units for a regular meal - 22 units for a larger meal  Please decrease the insulin doses by 4 units for the meal before you plan to be active.  Please return in 3 months with your sugar log.    - continue checking sugars at different times of the day - check 3 times a day, rotating checks - he is up to date with yearly eye exams >> has DR >> sees Dr Zigmund Daniel  - check hBA1c today >> 6.5% (great, stable!) - refuses flu shot - RTC in 3 mo

## 2015-03-25 NOTE — Patient Instructions (Addendum)
Please change the U500 insulin doses as follows  (decrease insulin with dinner):  Breakfast: - 18 units  Lunch: - 10 units for a regular meal - 12 units for a larger meal  Dinner: - 16 units for a regular meal - 18 units for a larger meal  Please decrease the insulin doses by 4 units for the meal before you plan to be active.  Please return in 3 months with your sugar log.

## 2015-04-01 ENCOUNTER — Encounter (INDEPENDENT_AMBULATORY_CARE_PROVIDER_SITE_OTHER): Payer: Medicare Other | Admitting: Ophthalmology

## 2015-04-01 DIAGNOSIS — I10 Essential (primary) hypertension: Secondary | ICD-10-CM

## 2015-04-01 DIAGNOSIS — E113313 Type 2 diabetes mellitus with moderate nonproliferative diabetic retinopathy with macular edema, bilateral: Secondary | ICD-10-CM

## 2015-04-01 DIAGNOSIS — H43813 Vitreous degeneration, bilateral: Secondary | ICD-10-CM | POA: Diagnosis not present

## 2015-04-01 DIAGNOSIS — H35033 Hypertensive retinopathy, bilateral: Secondary | ICD-10-CM

## 2015-04-01 DIAGNOSIS — E11311 Type 2 diabetes mellitus with unspecified diabetic retinopathy with macular edema: Secondary | ICD-10-CM

## 2015-04-02 DIAGNOSIS — H40013 Open angle with borderline findings, low risk, bilateral: Secondary | ICD-10-CM | POA: Diagnosis not present

## 2015-04-02 DIAGNOSIS — H40033 Anatomical narrow angle, bilateral: Secondary | ICD-10-CM | POA: Diagnosis not present

## 2015-04-02 DIAGNOSIS — E113313 Type 2 diabetes mellitus with moderate nonproliferative diabetic retinopathy with macular edema, bilateral: Secondary | ICD-10-CM | POA: Diagnosis not present

## 2015-04-06 DIAGNOSIS — H40033 Anatomical narrow angle, bilateral: Secondary | ICD-10-CM | POA: Diagnosis not present

## 2015-04-08 DIAGNOSIS — I129 Hypertensive chronic kidney disease with stage 1 through stage 4 chronic kidney disease, or unspecified chronic kidney disease: Secondary | ICD-10-CM | POA: Diagnosis not present

## 2015-04-08 DIAGNOSIS — N183 Chronic kidney disease, stage 3 (moderate): Secondary | ICD-10-CM | POA: Diagnosis not present

## 2015-04-08 DIAGNOSIS — E872 Acidosis: Secondary | ICD-10-CM | POA: Diagnosis not present

## 2015-04-08 DIAGNOSIS — E1122 Type 2 diabetes mellitus with diabetic chronic kidney disease: Secondary | ICD-10-CM | POA: Diagnosis not present

## 2015-04-08 DIAGNOSIS — M109 Gout, unspecified: Secondary | ICD-10-CM | POA: Diagnosis not present

## 2015-04-20 DIAGNOSIS — H40033 Anatomical narrow angle, bilateral: Secondary | ICD-10-CM | POA: Diagnosis not present

## 2015-05-05 ENCOUNTER — Encounter (INDEPENDENT_AMBULATORY_CARE_PROVIDER_SITE_OTHER): Payer: Medicare Other | Admitting: Ophthalmology

## 2015-05-12 ENCOUNTER — Encounter (INDEPENDENT_AMBULATORY_CARE_PROVIDER_SITE_OTHER): Payer: Medicare Other | Admitting: Ophthalmology

## 2015-05-14 DIAGNOSIS — H40013 Open angle with borderline findings, low risk, bilateral: Secondary | ICD-10-CM | POA: Diagnosis not present

## 2015-05-14 DIAGNOSIS — H40033 Anatomical narrow angle, bilateral: Secondary | ICD-10-CM | POA: Diagnosis not present

## 2015-05-17 ENCOUNTER — Encounter (INDEPENDENT_AMBULATORY_CARE_PROVIDER_SITE_OTHER): Payer: Medicare Other | Admitting: Ophthalmology

## 2015-05-17 ENCOUNTER — Other Ambulatory Visit: Payer: Self-pay | Admitting: Internal Medicine

## 2015-05-17 DIAGNOSIS — E11311 Type 2 diabetes mellitus with unspecified diabetic retinopathy with macular edema: Secondary | ICD-10-CM | POA: Diagnosis not present

## 2015-05-17 DIAGNOSIS — H2513 Age-related nuclear cataract, bilateral: Secondary | ICD-10-CM | POA: Diagnosis not present

## 2015-05-17 DIAGNOSIS — H43813 Vitreous degeneration, bilateral: Secondary | ICD-10-CM | POA: Diagnosis not present

## 2015-05-17 DIAGNOSIS — E113313 Type 2 diabetes mellitus with moderate nonproliferative diabetic retinopathy with macular edema, bilateral: Secondary | ICD-10-CM | POA: Diagnosis not present

## 2015-05-17 DIAGNOSIS — H35033 Hypertensive retinopathy, bilateral: Secondary | ICD-10-CM

## 2015-05-17 DIAGNOSIS — I10 Essential (primary) hypertension: Secondary | ICD-10-CM

## 2015-06-15 DIAGNOSIS — Z794 Long term (current) use of insulin: Secondary | ICD-10-CM | POA: Diagnosis not present

## 2015-06-15 DIAGNOSIS — B351 Tinea unguium: Secondary | ICD-10-CM | POA: Diagnosis not present

## 2015-06-15 DIAGNOSIS — E1140 Type 2 diabetes mellitus with diabetic neuropathy, unspecified: Secondary | ICD-10-CM | POA: Diagnosis not present

## 2015-06-25 ENCOUNTER — Other Ambulatory Visit (INDEPENDENT_AMBULATORY_CARE_PROVIDER_SITE_OTHER): Payer: Medicare Other | Admitting: *Deleted

## 2015-06-25 ENCOUNTER — Ambulatory Visit (INDEPENDENT_AMBULATORY_CARE_PROVIDER_SITE_OTHER): Payer: Medicare Other | Admitting: Internal Medicine

## 2015-06-25 ENCOUNTER — Encounter: Payer: Self-pay | Admitting: Internal Medicine

## 2015-06-25 VITALS — BP 112/62 | HR 96 | Wt 244.0 lb

## 2015-06-25 DIAGNOSIS — E1165 Type 2 diabetes mellitus with hyperglycemia: Secondary | ICD-10-CM | POA: Diagnosis not present

## 2015-06-25 DIAGNOSIS — E1149 Type 2 diabetes mellitus with other diabetic neurological complication: Secondary | ICD-10-CM | POA: Diagnosis not present

## 2015-06-25 DIAGNOSIS — E1142 Type 2 diabetes mellitus with diabetic polyneuropathy: Secondary | ICD-10-CM

## 2015-06-25 DIAGNOSIS — Z794 Long term (current) use of insulin: Secondary | ICD-10-CM | POA: Diagnosis not present

## 2015-06-25 DIAGNOSIS — IMO0002 Reserved for concepts with insufficient information to code with codable children: Secondary | ICD-10-CM

## 2015-06-25 LAB — POCT GLYCOSYLATED HEMOGLOBIN (HGB A1C): HEMOGLOBIN A1C: 6.9

## 2015-06-25 MED ORDER — INSULIN REGULAR HUMAN (CONC) 500 UNIT/ML ~~LOC~~ SOLN
SUBCUTANEOUS | Status: DC
Start: 2015-06-25 — End: 2016-06-20

## 2015-06-25 NOTE — Patient Instructions (Signed)
Please change the U500 doses: Breakfast: - 12 units if you plan to be active in the morning - 16 units for a regular meal  Lunch: - if you eat lunch, use 10 units  Dinner: - 12 units if you plan to be active in the afternoon - 16 units for a regular meal

## 2015-06-25 NOTE — Progress Notes (Signed)
Patient ID: Aaron Mckenzie, male   DOB: 1960-12-05, 55 y.o.   MRN: [id number]  HPI: Aaron Mckenzie is a 55 y.o.-year-old male, returning for f/u for DM2, dx 2004, insulin-dependent since 12/2012, uncontrolled, with complications (CKD, neuropathy, DR). Last visit 3 mo ago.  Last hemoglobin A1c was:  Lab Results  Component Value Date   HGBA1C 6.9 06/25/2015   HGBA1C 6.5 03/25/2015   HGBA1C 6.6 12/24/2014   Pt was on a regimen of: - Lantus 60 >> 70 >> 80 units at bedtime - Novolog 30 >> 40 units bid (does not eat lunch, if he does, takes 20 units) - NovoLog 10 units with snack if it contains >15 g carbs (subtract fiber content from the total nr of carbs) We stopped Glipizide 5 mg bid when we started mealtime insulin We had to stop Metformin at last visit 2/2 decreased kidney fxn.  Pt is now on: - U500: Breakfast: - 16 units for a regular meal  Lunch: - occas. 10 units  Dinner: - 16 units for a regular meal  Pt checks sugars 2-3x a day - reviewed log: - am: 108-227 >> 150-197 >> 90, 120-188 >> 140, 157-247 >> 95-145, 200 >> 108-188 - before lunch: 260 >> n/c >> 178, 195 >> 249 >> 224 >> n/c >> 84-162 >> 87-115 >> 131, 170 - 2h after lunch: 400, 536 >> n/c  >> 197 - before dinner: 76-181 >> 100-170 >> 73, 89, 91-168 >> 123, 144-211 >> 58, 68, 90-204 >> 82, 109-174, 222 - 2h after dinner: 300s >> n/c  - bedtime: 176-314 >> 206-310 >> 166, 219 >> n/c >> 100-170 >> 76x1 >> 109-135, 229 >> 54-118 >> 53, 68, 120, 154 + lows - at night >> 53; he has hypoglycemia awareness but unknown how low. Highest CBG 200s (few) >> 222.   Pt's meals are: - Breakfast: eggs, meat, grits, wheat toast - Lunch: may skip or eat late b'fast  - Dinner: chicken/fish/pork chops, 2 sides, salad, baked potatoes - Snacks: 2 a day - apple slices, popcorn  - + CKD, last BUN/creatinine:  Lab Results  Component Value Date   BUN 32* 01/29/2015   CREATININE 1.58* 01/29/2015  On Lisinopril. - last set of  lipids: Lab Results  Component Value Date   CHOL 201* 01/29/2015   HDL 24.30* 01/29/2015   LDLDIRECT 122.0 01/29/2015   TRIG 327.0* 01/29/2015   CHOLHDL 8 01/29/2015  On Pravastatin. On ASA 81.  - last eye exam was in 07/21/2014. + DR. - + numbness and tingling in his feet and hands. On neurontin 600-600-900 mg.  He also has a history of HTN, HL, pseudogout, bilateral carpal tunnel s/p sx in each hand: 1985 and 2000.  I reviewed pt's medications, allergies, PMH, social hx, family hx, and changes were documented in the history of present illness. Otherwise, unchanged from my initial visit note.  ROS: Constitutional: no  weight gain, no fatigue, no subjective hyperthermia/hypothermia Eyes: no blurry vision, no xerophthalmia ENT: no sore throat, no nodules palpated in throat, no dysphagia/odynophagia, no hoarseness Cardiovascular: no CP/SOB/palpitations/+ hand and leg swelling Respiratory: no cough/no SOB Gastrointestinal: no N/V/D/C Musculoskeletal: no muscle/joint aches Skin: no rashes, Neurological: no tremors/numbness/tingling/dizziness  PE: BP 112/62 mmHg  Pulse 96  Wt 244 lb (110.678 kg)  SpO2 97% Body mass index is 36.02 kg/(m^2). Wt Readings from Last 3 Encounters:  06/25/15 244 lb (110.678 kg)  03/25/15 247 lb 9.6 oz (112.311 kg)  01/29/15 246 lb (111.585  kg)   Constitutional: overweight, in NAD Eyes: PERRLA, EOMI, no exophthalmos ENT: moist mucous membranes, no thyromegaly, no cervical lymphadenopathy Cardiovascular: RRR, No MRG, + pitting bilateral leg edema Respiratory: CTA B Gastrointestinal: abdomen soft, NT, ND, BS+ Musculoskeletal: no deformities, strength intact in all 4 Skin: moist, warm, no rashes  ASSESSMENT: 1. DM2, insulin-dependent, uncontrolled, with complications - PN - CKD  PLAN:  1. Patient with long-standing, uncontrolled diabetes, with increased insulin resistance, with improving DM control after starting U500 insulin >> still few lows at  night especially after pm activity in the yard. Will decrease insulin with meals if he is active around the time of the meal & advised him and wife to call with any more lows after the above change. Will leave the insulin with lunch at 10 units. Most days he does not have a lunch >> I made sure he does not take the insulin then. - I advised him to:  Patient Instructions  Please change the U500 doses: Breakfast: - 12 units if you plan to be active in the morning - 16 units for a regular meal  Lunch: - if you eat lunch, use 10 units  Dinner: - 12 units if you plan to be active in the afternoon - 16 units for a regular meal   - continue checking sugars at different times of the day - check 3 times a day, rotating checks - he is up to date with yearly eye exams >> has DR >> sees Dr Zigmund Daniel  - check hBA1c today >> 6.9% (higher, but still at goal) - refused flu shot - RTC in 3 mo

## 2015-06-28 ENCOUNTER — Encounter (INDEPENDENT_AMBULATORY_CARE_PROVIDER_SITE_OTHER): Payer: Medicare Other | Admitting: Ophthalmology

## 2015-06-30 ENCOUNTER — Encounter (INDEPENDENT_AMBULATORY_CARE_PROVIDER_SITE_OTHER): Payer: Medicare Other | Admitting: Ophthalmology

## 2015-06-30 DIAGNOSIS — I10 Essential (primary) hypertension: Secondary | ICD-10-CM | POA: Diagnosis not present

## 2015-06-30 DIAGNOSIS — E113292 Type 2 diabetes mellitus with mild nonproliferative diabetic retinopathy without macular edema, left eye: Secondary | ICD-10-CM

## 2015-06-30 DIAGNOSIS — H43813 Vitreous degeneration, bilateral: Secondary | ICD-10-CM

## 2015-06-30 DIAGNOSIS — H35033 Hypertensive retinopathy, bilateral: Secondary | ICD-10-CM | POA: Diagnosis not present

## 2015-06-30 DIAGNOSIS — E113311 Type 2 diabetes mellitus with moderate nonproliferative diabetic retinopathy with macular edema, right eye: Secondary | ICD-10-CM | POA: Diagnosis not present

## 2015-06-30 DIAGNOSIS — E11311 Type 2 diabetes mellitus with unspecified diabetic retinopathy with macular edema: Secondary | ICD-10-CM

## 2015-06-30 LAB — HM DIABETES EYE EXAM

## 2015-07-07 ENCOUNTER — Encounter: Payer: Self-pay | Admitting: *Deleted

## 2015-07-18 ENCOUNTER — Other Ambulatory Visit: Payer: Self-pay | Admitting: Internal Medicine

## 2015-08-09 ENCOUNTER — Other Ambulatory Visit: Payer: Self-pay | Admitting: Internal Medicine

## 2015-08-11 ENCOUNTER — Encounter (INDEPENDENT_AMBULATORY_CARE_PROVIDER_SITE_OTHER): Payer: Medicare Other | Admitting: Ophthalmology

## 2015-08-11 DIAGNOSIS — E113313 Type 2 diabetes mellitus with moderate nonproliferative diabetic retinopathy with macular edema, bilateral: Secondary | ICD-10-CM | POA: Diagnosis not present

## 2015-08-11 DIAGNOSIS — I10 Essential (primary) hypertension: Secondary | ICD-10-CM | POA: Diagnosis not present

## 2015-08-11 DIAGNOSIS — E11311 Type 2 diabetes mellitus with unspecified diabetic retinopathy with macular edema: Secondary | ICD-10-CM | POA: Diagnosis not present

## 2015-08-11 DIAGNOSIS — H43813 Vitreous degeneration, bilateral: Secondary | ICD-10-CM

## 2015-08-11 DIAGNOSIS — H35033 Hypertensive retinopathy, bilateral: Secondary | ICD-10-CM | POA: Diagnosis not present

## 2015-08-13 DIAGNOSIS — H40033 Anatomical narrow angle, bilateral: Secondary | ICD-10-CM | POA: Diagnosis not present

## 2015-08-13 DIAGNOSIS — H40013 Open angle with borderline findings, low risk, bilateral: Secondary | ICD-10-CM | POA: Diagnosis not present

## 2015-08-13 DIAGNOSIS — E113313 Type 2 diabetes mellitus with moderate nonproliferative diabetic retinopathy with macular edema, bilateral: Secondary | ICD-10-CM | POA: Diagnosis not present

## 2015-09-01 DIAGNOSIS — E1140 Type 2 diabetes mellitus with diabetic neuropathy, unspecified: Secondary | ICD-10-CM | POA: Diagnosis not present

## 2015-09-01 DIAGNOSIS — L97511 Non-pressure chronic ulcer of other part of right foot limited to breakdown of skin: Secondary | ICD-10-CM | POA: Diagnosis not present

## 2015-09-01 DIAGNOSIS — Z794 Long term (current) use of insulin: Secondary | ICD-10-CM | POA: Diagnosis not present

## 2015-09-01 DIAGNOSIS — B351 Tinea unguium: Secondary | ICD-10-CM | POA: Diagnosis not present

## 2015-09-01 DIAGNOSIS — L97411 Non-pressure chronic ulcer of right heel and midfoot limited to breakdown of skin: Secondary | ICD-10-CM | POA: Diagnosis not present

## 2015-09-13 DIAGNOSIS — H2513 Age-related nuclear cataract, bilateral: Secondary | ICD-10-CM | POA: Diagnosis not present

## 2015-09-13 DIAGNOSIS — H40013 Open angle with borderline findings, low risk, bilateral: Secondary | ICD-10-CM | POA: Diagnosis not present

## 2015-09-13 DIAGNOSIS — H40033 Anatomical narrow angle, bilateral: Secondary | ICD-10-CM | POA: Diagnosis not present

## 2015-09-16 DIAGNOSIS — L97511 Non-pressure chronic ulcer of other part of right foot limited to breakdown of skin: Secondary | ICD-10-CM | POA: Diagnosis not present

## 2015-09-16 DIAGNOSIS — L97411 Non-pressure chronic ulcer of right heel and midfoot limited to breakdown of skin: Secondary | ICD-10-CM | POA: Diagnosis not present

## 2015-09-27 ENCOUNTER — Ambulatory Visit (INDEPENDENT_AMBULATORY_CARE_PROVIDER_SITE_OTHER): Payer: Medicare Other | Admitting: Internal Medicine

## 2015-09-27 ENCOUNTER — Encounter: Payer: Self-pay | Admitting: Internal Medicine

## 2015-09-27 VITALS — BP 142/82 | HR 87 | Ht 69.0 in | Wt 245.0 lb

## 2015-09-27 DIAGNOSIS — E1165 Type 2 diabetes mellitus with hyperglycemia: Secondary | ICD-10-CM

## 2015-09-27 DIAGNOSIS — E1142 Type 2 diabetes mellitus with diabetic polyneuropathy: Secondary | ICD-10-CM

## 2015-09-27 DIAGNOSIS — Z794 Long term (current) use of insulin: Secondary | ICD-10-CM

## 2015-09-27 DIAGNOSIS — IMO0002 Reserved for concepts with insufficient information to code with codable children: Secondary | ICD-10-CM

## 2015-09-27 LAB — POCT GLYCOSYLATED HEMOGLOBIN (HGB A1C): HEMOGLOBIN A1C: 7.5

## 2015-09-27 NOTE — Patient Instructions (Addendum)
Patient Instructions  Please increase U500 insulin: Breakfast: - 12 units if you plan to be active in the morning - 16 >> 18 units for a regular meal  Lunch: - if you eat lunch, use 10 >> 12 units  Dinner: - 12 units if you plan to be active in the afternoon - 16 >> 18 units for a regular meal  Please return in 3 months with your sugar log.

## 2015-09-27 NOTE — Addendum Note (Signed)
Addended by: Caprice Beaver T on: 09/27/2015 02:35 PM   Modules accepted: Orders

## 2015-09-27 NOTE — Progress Notes (Signed)
Patient ID: Aaron Mckenzie, male   DOB: Oct 14, 1960, 55 y.o.   MRN: [id number]  HPI: Aaron Mckenzie is a 55 y.o.-year-old male, returning for f/u for DM2, dx 2004, insulin-dependent since 12/2012, uncontrolled, with complications (CKD, neuropathy, DR). Last visit 3 mo ago.  He had blisters on his R foot >> now in boot >> Sees Dr. Cleda Mccreedy.  Last hemoglobin A1c was:  Lab Results  Component Value Date   HGBA1C 6.9 06/25/2015   HGBA1C 6.5 03/25/2015   HGBA1C 6.6 12/24/2014   Pt was on a regimen of: - Lantus 60 >> 70 >> 80 units at bedtime - Novolog 30 >> 40 units bid (does not eat lunch, if he does, takes 20 units) - NovoLog 10 units with snack if it contains >15 g carbs (subtract fiber content from the total nr of carbs) We stopped Glipizide 5 mg bid when we started mealtime insulin We had to stop Metformin at last visit 2/2 decreased kidney fxn.  Pt is now on: - U500: Breakfast: - 12 units if you plan to be active in the morning - 16 units for a regular meal  Lunch: - if you eat lunch, use 10 units  Dinner: - 12 units if you plan to be active in the afternoon - 16 units for a regular meal  Pt checks sugars 2-3x a day - forgot log - am: 108-227 >> 150-197 >> 90, 120-188 >> 140, 157-247 >> 95-145, 200 >> 108-188 >> 180-200s - before lunch: 260 >> n/c >> 178, 195 >> 249 >> 224 >> n/c >> 84-162 >> 87-115 >> 131, 170 >> n/c - 2h after lunch: 400, 536 >> n/c  >> 197 >> n/c - before dinner: 76-181 >> 100-170 >> 73, 89, 91-168 >> 123, 144-211 >> 58, 68, 90-204 >> 82, 109-174, 222 >> 140-180 - 2h after dinner: 300s >> n/c  - bedtime: 206-310 >> 166, 219 >> n/c >> 100-170 >> 76x1 >> 109-135, 229 >> 54-118 >> 53, 68, 120, 154 >> 180-190 + lows - at night >> 53 >> 100; he has hypoglycemia awareness but unknown how low. Highest CBG 200s (few) >> 222 >> 220  Pt's meals are: - Breakfast: eggs, meat, grits, wheat toast - Lunch: may skip or eat late b'fast  - Dinner: chicken/fish/pork  chops, 2 sides, salad, baked potatoes - Snacks: 2 a day - apple slices, popcorn  - + CKD, last BUN/creatinine:  Lab Results  Component Value Date   BUN 32 (H) 01/29/2015   CREATININE 1.58 (H) 01/29/2015  On Lisinopril. - last set of lipids: Lab Results  Component Value Date   CHOL 201 (H) 01/29/2015   HDL 24.30 (L) 01/29/2015   LDLDIRECT 122.0 01/29/2015   TRIG 327.0 (H) 01/29/2015   CHOLHDL 8 01/29/2015  On Pravastatin. On ASA 81.  - last eye exam was in 06/2015. + DR. - + numbness and tingling in his feet and hands. On neurontin 600-600-900 mg.  He also has a history of HTN, HL, pseudogout, bilateral carpal tunnel s/p sx in each hand: 1985 and 2000.  I reviewed pt's medications, allergies, PMH, social hx, family hx, and changes were documented in the history of present illness. Otherwise, unchanged from my initial visit note.  ROS: Constitutional: no  weight gain, no fatigue, no subjective hyperthermia/hypothermia Eyes: no blurry vision, no xerophthalmia ENT: no sore throat, no nodules palpated in throat, no dysphagia/odynophagia, no hoarseness Cardiovascular: no CP/SOB/palpitations/+ hand and leg swelling Respiratory: no cough/no SOB Gastrointestinal:  no N/V/D/C Musculoskeletal: no muscle/joint aches Skin: no rashes, + blisters R foot Neurological: no tremors/numbness/tingling/dizziness  PE: BP (!) 142/82 (BP Location: Left Arm, Patient Position: Sitting)   Pulse 87   Ht 5\' 9"  (1.753 m)   Wt 245 lb (111.1 kg)   SpO2 98%   BMI 36.18 kg/m  Body mass index is 36.18 kg/m. Wt Readings from Last 3 Encounters:  09/27/15 245 lb (111.1 kg)  06/25/15 244 lb (110.7 kg)  03/25/15 247 lb 9.6 oz (112.3 kg)   Constitutional: overweight, in NAD Eyes: PERRLA, EOMI, no exophthalmos ENT: moist mucous membranes, no thyromegaly, no cervical lymphadenopathy Cardiovascular: RRR, No MRG, + pitting bilateral leg edema Respiratory: CTA B Gastrointestinal: abdomen soft, NT, ND,  BS+ Musculoskeletal: no deformities, strength intact in all 4, R foot in in boot (had blisters after he wore footies that were too small) Skin: moist, warm, no rashes  ASSESSMENT: 1. DM2, insulin-dependent, uncontrolled, with complications - PN - CKD  PLAN:  1. Patient with long-standing, uncontrolled diabetes, with increased insulin resistance, with improving DM control after starting U500 insulin >> at last visit, he still had a few lows at night especially after pm activity in the yard >> decreased insulin with meals if planning to be active around the time of the meal & advised him and wife to call with any more lows after the above change.  - sugars are now higher as he is not active >> foot in boot >> mat have boot off in 1 week. Strongly advised him to start walking or do light exercise afterwards  - I advised him to:  Patient Instructions  Please increase U500 insulin: Breakfast: - 12 units if you plan to be active in the morning - 16 >> 18 units for a regular meal  Lunch: - if you eat lunch, use 10 >> 12 units  Dinner: - 12 units if you plan to be active in the afternoon - 16 >> 18 units for a regular meal  Please return in 3 months with your sugar log.    - continue checking sugars at different times of the day - check 3 times a day, rotating checks - he is up to date with yearly eye exams >> has DR >> sees Dr Zigmund Daniel  - check HbA1c today >> 7.5% (higher) - RTC in 3 mo

## 2015-09-29 ENCOUNTER — Other Ambulatory Visit: Payer: Self-pay | Admitting: Internal Medicine

## 2015-10-04 DIAGNOSIS — N183 Chronic kidney disease, stage 3 (moderate): Secondary | ICD-10-CM | POA: Diagnosis not present

## 2015-10-04 DIAGNOSIS — I129 Hypertensive chronic kidney disease with stage 1 through stage 4 chronic kidney disease, or unspecified chronic kidney disease: Secondary | ICD-10-CM | POA: Diagnosis not present

## 2015-10-04 DIAGNOSIS — R809 Proteinuria, unspecified: Secondary | ICD-10-CM | POA: Diagnosis not present

## 2015-10-04 DIAGNOSIS — E559 Vitamin D deficiency, unspecified: Secondary | ICD-10-CM | POA: Diagnosis not present

## 2015-10-04 DIAGNOSIS — E1122 Type 2 diabetes mellitus with diabetic chronic kidney disease: Secondary | ICD-10-CM | POA: Diagnosis not present

## 2015-10-05 ENCOUNTER — Other Ambulatory Visit: Payer: Self-pay | Admitting: Internal Medicine

## 2015-10-06 ENCOUNTER — Encounter (INDEPENDENT_AMBULATORY_CARE_PROVIDER_SITE_OTHER): Payer: Medicare Other | Admitting: Ophthalmology

## 2015-10-06 DIAGNOSIS — H2513 Age-related nuclear cataract, bilateral: Secondary | ICD-10-CM | POA: Diagnosis not present

## 2015-10-06 DIAGNOSIS — H35033 Hypertensive retinopathy, bilateral: Secondary | ICD-10-CM | POA: Diagnosis not present

## 2015-10-06 DIAGNOSIS — I10 Essential (primary) hypertension: Secondary | ICD-10-CM

## 2015-10-06 DIAGNOSIS — E113313 Type 2 diabetes mellitus with moderate nonproliferative diabetic retinopathy with macular edema, bilateral: Secondary | ICD-10-CM | POA: Diagnosis not present

## 2015-10-06 DIAGNOSIS — H43813 Vitreous degeneration, bilateral: Secondary | ICD-10-CM | POA: Diagnosis not present

## 2015-10-06 DIAGNOSIS — E11311 Type 2 diabetes mellitus with unspecified diabetic retinopathy with macular edema: Secondary | ICD-10-CM

## 2015-10-07 DIAGNOSIS — L97411 Non-pressure chronic ulcer of right heel and midfoot limited to breakdown of skin: Secondary | ICD-10-CM | POA: Diagnosis not present

## 2015-10-19 ENCOUNTER — Other Ambulatory Visit: Payer: Self-pay | Admitting: Internal Medicine

## 2015-11-03 ENCOUNTER — Encounter (INDEPENDENT_AMBULATORY_CARE_PROVIDER_SITE_OTHER): Payer: Medicare Other | Admitting: Ophthalmology

## 2015-11-04 DIAGNOSIS — Z794 Long term (current) use of insulin: Secondary | ICD-10-CM | POA: Diagnosis not present

## 2015-11-04 DIAGNOSIS — L02619 Cutaneous abscess of unspecified foot: Secondary | ICD-10-CM | POA: Diagnosis not present

## 2015-11-04 DIAGNOSIS — L03119 Cellulitis of unspecified part of limb: Secondary | ICD-10-CM | POA: Diagnosis not present

## 2015-11-04 DIAGNOSIS — E1140 Type 2 diabetes mellitus with diabetic neuropathy, unspecified: Secondary | ICD-10-CM | POA: Diagnosis not present

## 2015-11-04 DIAGNOSIS — L97412 Non-pressure chronic ulcer of right heel and midfoot with fat layer exposed: Secondary | ICD-10-CM | POA: Diagnosis not present

## 2015-11-08 DIAGNOSIS — H40013 Open angle with borderline findings, low risk, bilateral: Secondary | ICD-10-CM | POA: Diagnosis not present

## 2015-11-08 DIAGNOSIS — H2513 Age-related nuclear cataract, bilateral: Secondary | ICD-10-CM | POA: Diagnosis not present

## 2015-11-08 DIAGNOSIS — H40033 Anatomical narrow angle, bilateral: Secondary | ICD-10-CM | POA: Diagnosis not present

## 2015-11-11 ENCOUNTER — Encounter (INDEPENDENT_AMBULATORY_CARE_PROVIDER_SITE_OTHER): Payer: Medicare Other | Admitting: Ophthalmology

## 2015-11-11 DIAGNOSIS — H43813 Vitreous degeneration, bilateral: Secondary | ICD-10-CM

## 2015-11-11 DIAGNOSIS — E113313 Type 2 diabetes mellitus with moderate nonproliferative diabetic retinopathy with macular edema, bilateral: Secondary | ICD-10-CM | POA: Diagnosis not present

## 2015-11-11 DIAGNOSIS — E11311 Type 2 diabetes mellitus with unspecified diabetic retinopathy with macular edema: Secondary | ICD-10-CM | POA: Diagnosis not present

## 2015-11-11 DIAGNOSIS — I10 Essential (primary) hypertension: Secondary | ICD-10-CM

## 2015-11-11 DIAGNOSIS — H35033 Hypertensive retinopathy, bilateral: Secondary | ICD-10-CM

## 2015-11-18 DIAGNOSIS — L97412 Non-pressure chronic ulcer of right heel and midfoot with fat layer exposed: Secondary | ICD-10-CM | POA: Diagnosis not present

## 2015-11-20 ENCOUNTER — Other Ambulatory Visit: Payer: Self-pay | Admitting: Internal Medicine

## 2015-12-07 DIAGNOSIS — L97412 Non-pressure chronic ulcer of right heel and midfoot with fat layer exposed: Secondary | ICD-10-CM | POA: Diagnosis not present

## 2015-12-09 ENCOUNTER — Encounter (INDEPENDENT_AMBULATORY_CARE_PROVIDER_SITE_OTHER): Payer: Medicare Other | Admitting: Ophthalmology

## 2015-12-09 DIAGNOSIS — E113393 Type 2 diabetes mellitus with moderate nonproliferative diabetic retinopathy without macular edema, bilateral: Secondary | ICD-10-CM | POA: Diagnosis not present

## 2015-12-09 DIAGNOSIS — I10 Essential (primary) hypertension: Secondary | ICD-10-CM | POA: Diagnosis not present

## 2015-12-09 DIAGNOSIS — H43813 Vitreous degeneration, bilateral: Secondary | ICD-10-CM

## 2015-12-09 DIAGNOSIS — H35033 Hypertensive retinopathy, bilateral: Secondary | ICD-10-CM

## 2015-12-09 DIAGNOSIS — E11311 Type 2 diabetes mellitus with unspecified diabetic retinopathy with macular edema: Secondary | ICD-10-CM | POA: Diagnosis not present

## 2015-12-10 ENCOUNTER — Other Ambulatory Visit: Payer: Self-pay | Admitting: Internal Medicine

## 2015-12-28 ENCOUNTER — Encounter: Payer: Self-pay | Admitting: Internal Medicine

## 2015-12-28 ENCOUNTER — Ambulatory Visit (INDEPENDENT_AMBULATORY_CARE_PROVIDER_SITE_OTHER): Payer: Medicare Other | Admitting: Internal Medicine

## 2015-12-28 VITALS — BP 132/88 | HR 95 | Ht 69.0 in | Wt 244.0 lb

## 2015-12-28 DIAGNOSIS — Z794 Long term (current) use of insulin: Secondary | ICD-10-CM

## 2015-12-28 DIAGNOSIS — E1165 Type 2 diabetes mellitus with hyperglycemia: Secondary | ICD-10-CM

## 2015-12-28 DIAGNOSIS — IMO0002 Reserved for concepts with insufficient information to code with codable children: Secondary | ICD-10-CM

## 2015-12-28 DIAGNOSIS — E1142 Type 2 diabetes mellitus with diabetic polyneuropathy: Secondary | ICD-10-CM | POA: Diagnosis not present

## 2015-12-28 LAB — POCT GLYCOSYLATED HEMOGLOBIN (HGB A1C): Hemoglobin A1C: 7.5

## 2015-12-28 NOTE — Addendum Note (Signed)
Addended by: Caprice Beaver T on: 12/28/2015 12:51 PM   Modules accepted: Orders

## 2015-12-28 NOTE — Patient Instructions (Addendum)
Please increase U500 insulin: - 12 units if you plan to be active after the meal - 16 units for a regular meal - 18 units for a large meal  Please add the following sliding scale of insulin: - 150-175: + 1 unit  - 176-200: + 2 units  - 201-225: + 3 units  - > 226: + 4 units   Please return in 3 months with your sugar log.

## 2015-12-28 NOTE — Progress Notes (Signed)
Patient ID: Aaron Mckenzie, male   DOB: 1960-08-17, 55 y.o.   MRN: [id number]  HPI: Aaron Mckenzie is a 55 y.o.-year-old male, returning for f/u for DM2, dx 2004, insulin-dependent since 12/2012, uncontrolled, with complications (CKD, neuropathy, DR). Last visit 3 mo ago.  He had blisters on his R foot >> still in boot >> Sees Dr. Cleda Mccreedy.  Last hemoglobin A1c was:  Lab Results  Component Value Date   HGBA1C 7.5 09/27/2015   HGBA1C 6.9 06/25/2015   HGBA1C 6.5 03/25/2015   Pt was on a regimen of: - Lantus 60 >> 70 >> 80 units at bedtime - Novolog 30 >> 40 units bid (does not eat lunch, if he does, takes 20 units) - NovoLog 10 units with snack if it contains >15 g carbs (subtract fiber content from the total nr of carbs) We stopped Glipizide 5 mg bid when we started mealtime insulin We had to stop Metformin 2/2 decreased kidney fxn.  Pt is now on: - U500: Brunch - 12 units if you plan to be active in the morning - did not use as he was not active - 16 units for a regular meal  Dinner: - 12 units if you plan to be active in the afternoon - did not use - 16 units for a regular meal  Pt checks sugars 2-3x a day - forgot log - am:150-197 >> 90, 120-188 >> 140, 157-247 >> 95-145, 200 >> 108-188 >> 180-200s >> 92-209, 226 - before lunch: 260 >> n/c >> 178, 195 >> 249 >> 224 >> n/c >> 84-162 >> 87-115 >> 131, 170 >> n/c >> 223 - 2h after lunch: 400, 536 >> n/c  >> 197 >> n/c - before dinner: 73, 89, 91-168 >> 123, 144-211 >> 58, 68, 90-204 >> 82, 109-174, 222 >> 140-180 >> 89-180, 216 - 2h after dinner: 300s >> n/c  - bedtime: 100-170 >> 76x1 >> 109-135, 229 >> 54-118 >> 53, 68, 120, 154 >> 180-190 + lows - at night >> 53 >> 100 >> 89; he has hypoglycemia awareness but unknown how low.  Highest CBG 200s (few) >> 222 >> 220 >> 226.  Pt's meals are: - Breakfast: eggs, meat, grits, wheat toast - Lunch: may skip or eat late b'fast  - Dinner: chicken/fish/pork chops, 2 sides, salad,  baked potatoes - Snacks: 2 a day - apple slices, popcorn  - + CKD, last BUN/creatinine:  Lab Results  Component Value Date   BUN 32 (H) 01/29/2015   CREATININE 1.58 (H) 01/29/2015  On Lisinopril. - last set of lipids: Lab Results  Component Value Date   CHOL 201 (H) 01/29/2015   HDL 24.30 (L) 01/29/2015   LDLDIRECT 122.0 01/29/2015   TRIG 327.0 (H) 01/29/2015   CHOLHDL 8 01/29/2015  On Pravastatin. On ASA 81.  - last eye exam was in 06/2015. + DR. - + numbness and tingling in his feet and hands. On neurontin 600-600-900 mg.  He also has a history of HTN, HL, pseudogout, bilateral carpal tunnel s/p sx in each hand: 1985 and 2000.  I reviewed pt's medications, allergies, PMH, social hx, family hx, and changes were documented in the history of present illness. Otherwise, unchanged from my initial visit note.  ROS: Constitutional: no  weight gain, no fatigue, no subjective hyperthermia/hypothermia Eyes: no blurry vision, no xerophthalmia ENT: no sore throat, no nodules palpated in throat, no dysphagia/odynophagia, no hoarseness Cardiovascular: no CP/SOB/palpitations/leg swelling Respiratory: no cough/no SOB Gastrointestinal: no N/V/D/C Musculoskeletal: no  muscle/joint aches Skin: no rashes, + blisters R foot - healing Neurological: no tremors/numbness/tingling/dizziness  PE: BP 132/88 (BP Location: Left Arm, Patient Position: Sitting)   Pulse 95   Ht 5\' 9"  (1.753 m)   Wt 244 lb (110.7 kg)   SpO2 97%   BMI 36.03 kg/m  Body mass index is 36.03 kg/m. Wt Readings from Last 3 Encounters:  12/28/15 244 lb (110.7 kg)  09/27/15 245 lb (111.1 kg)  06/25/15 244 lb (110.7 kg)   Constitutional: overweight, in NAD Eyes: PERRLA, EOMI, no exophthalmos ENT: moist mucous membranes, no thyromegaly, no cervical lymphadenopathy Cardiovascular: RRR, No MRG, + pitting bilateral leg edema Respiratory: CTA B Gastrointestinal: abdomen soft, NT, ND, BS+ Musculoskeletal: no deformities,  strength intact in all 4, R foot in in boot  Skin: moist, warm, no rashes  ASSESSMENT: 1. DM2, insulin-dependent, uncontrolled, with complications - PN - CKD  PLAN:  1. Patient with long-standing, uncontrolled diabetes, with increased insulin resistance, on U500 insulin >> no more lows since last visit but sugars still very variable (dietary indiscretions, lack of activity) - Will add a higher U500 insulin with a larger meal and also add a SSI - we did several exercises for calculating the insulin doses together >> he understood how he needs to use the SSI - I advised him to:  Patient Instructions  Please increase U500 insulin: - 12 units if you plan to be active after the meal - 16 units for a regular meal - 18 units for a large meal  Please add the following sliding scale of insulin: - 150-175: + 1 unit  - 176-200: + 2 units  - 201-225: + 3 units  - > 226: + 4 units   Please return in 3 months with your sugar log.    - continue checking sugars 2 times a day, before meals - he is up to date with yearly eye exams >> has DR >> sees Dr Zigmund Daniel  - refuses flu shot today - check HbA1c today >> 7.5% (stable) - RTC in 3 mo   Philemon Kingdom, MD PhD [facility] Endocrin[facility]

## 2016-01-03 ENCOUNTER — Encounter (INDEPENDENT_AMBULATORY_CARE_PROVIDER_SITE_OTHER): Payer: Medicare Other | Admitting: Ophthalmology

## 2016-01-03 DIAGNOSIS — H43813 Vitreous degeneration, bilateral: Secondary | ICD-10-CM | POA: Diagnosis not present

## 2016-01-03 DIAGNOSIS — I10 Essential (primary) hypertension: Secondary | ICD-10-CM

## 2016-01-03 DIAGNOSIS — E11311 Type 2 diabetes mellitus with unspecified diabetic retinopathy with macular edema: Secondary | ICD-10-CM | POA: Diagnosis not present

## 2016-01-03 DIAGNOSIS — E113313 Type 2 diabetes mellitus with moderate nonproliferative diabetic retinopathy with macular edema, bilateral: Secondary | ICD-10-CM

## 2016-01-03 DIAGNOSIS — H35033 Hypertensive retinopathy, bilateral: Secondary | ICD-10-CM

## 2016-01-04 DIAGNOSIS — E1140 Type 2 diabetes mellitus with diabetic neuropathy, unspecified: Secondary | ICD-10-CM | POA: Diagnosis not present

## 2016-01-04 DIAGNOSIS — Z794 Long term (current) use of insulin: Secondary | ICD-10-CM | POA: Diagnosis not present

## 2016-01-04 DIAGNOSIS — B351 Tinea unguium: Secondary | ICD-10-CM | POA: Diagnosis not present

## 2016-01-04 DIAGNOSIS — L97411 Non-pressure chronic ulcer of right heel and midfoot limited to breakdown of skin: Secondary | ICD-10-CM | POA: Diagnosis not present

## 2016-01-05 ENCOUNTER — Other Ambulatory Visit: Payer: Self-pay | Admitting: Internal Medicine

## 2016-01-28 ENCOUNTER — Other Ambulatory Visit: Payer: Self-pay | Admitting: Internal Medicine

## 2016-02-01 DIAGNOSIS — L97412 Non-pressure chronic ulcer of right heel and midfoot with fat layer exposed: Secondary | ICD-10-CM | POA: Diagnosis not present

## 2016-02-03 ENCOUNTER — Encounter (INDEPENDENT_AMBULATORY_CARE_PROVIDER_SITE_OTHER): Payer: Medicare Other | Admitting: Ophthalmology

## 2016-02-09 ENCOUNTER — Encounter (INDEPENDENT_AMBULATORY_CARE_PROVIDER_SITE_OTHER): Payer: Medicare Other | Admitting: Ophthalmology

## 2016-02-09 DIAGNOSIS — H43813 Vitreous degeneration, bilateral: Secondary | ICD-10-CM

## 2016-02-09 DIAGNOSIS — H35033 Hypertensive retinopathy, bilateral: Secondary | ICD-10-CM

## 2016-02-09 DIAGNOSIS — E113211 Type 2 diabetes mellitus with mild nonproliferative diabetic retinopathy with macular edema, right eye: Secondary | ICD-10-CM | POA: Diagnosis not present

## 2016-02-09 DIAGNOSIS — E11311 Type 2 diabetes mellitus with unspecified diabetic retinopathy with macular edema: Secondary | ICD-10-CM

## 2016-02-09 DIAGNOSIS — I10 Essential (primary) hypertension: Secondary | ICD-10-CM

## 2016-02-09 DIAGNOSIS — E113312 Type 2 diabetes mellitus with moderate nonproliferative diabetic retinopathy with macular edema, left eye: Secondary | ICD-10-CM | POA: Diagnosis not present

## 2016-02-09 DIAGNOSIS — H2513 Age-related nuclear cataract, bilateral: Secondary | ICD-10-CM

## 2016-02-11 ENCOUNTER — Other Ambulatory Visit: Payer: Self-pay | Admitting: Internal Medicine

## 2016-02-15 ENCOUNTER — Ambulatory Visit (INDEPENDENT_AMBULATORY_CARE_PROVIDER_SITE_OTHER): Payer: Medicare Other | Admitting: Internal Medicine

## 2016-02-15 ENCOUNTER — Encounter: Payer: Self-pay | Admitting: Internal Medicine

## 2016-02-15 ENCOUNTER — Other Ambulatory Visit: Payer: Self-pay | Admitting: Internal Medicine

## 2016-02-15 VITALS — BP 124/80 | HR 96 | Temp 98.1°F | Ht 69.0 in | Wt 246.0 lb

## 2016-02-15 DIAGNOSIS — M1189 Other specified crystal arthropathies, multiple sites: Secondary | ICD-10-CM

## 2016-02-15 DIAGNOSIS — Z7189 Other specified counseling: Secondary | ICD-10-CM

## 2016-02-15 DIAGNOSIS — L97401 Non-pressure chronic ulcer of unspecified heel and midfoot limited to breakdown of skin: Secondary | ICD-10-CM

## 2016-02-15 DIAGNOSIS — E785 Hyperlipidemia, unspecified: Secondary | ICD-10-CM

## 2016-02-15 DIAGNOSIS — E1149 Type 2 diabetes mellitus with other diabetic neurological complication: Secondary | ICD-10-CM

## 2016-02-15 DIAGNOSIS — E11621 Type 2 diabetes mellitus with foot ulcer: Secondary | ICD-10-CM | POA: Insufficient documentation

## 2016-02-15 DIAGNOSIS — Z0000 Encounter for general adult medical examination without abnormal findings: Secondary | ICD-10-CM

## 2016-02-15 DIAGNOSIS — E1122 Type 2 diabetes mellitus with diabetic chronic kidney disease: Secondary | ICD-10-CM

## 2016-02-15 DIAGNOSIS — E08621 Diabetes mellitus due to underlying condition with foot ulcer: Secondary | ICD-10-CM

## 2016-02-15 DIAGNOSIS — Z23 Encounter for immunization: Secondary | ICD-10-CM

## 2016-02-15 DIAGNOSIS — Z1211 Encounter for screening for malignant neoplasm of colon: Secondary | ICD-10-CM

## 2016-02-15 DIAGNOSIS — L97509 Non-pressure chronic ulcer of other part of unspecified foot with unspecified severity: Secondary | ICD-10-CM

## 2016-02-15 DIAGNOSIS — N183 Chronic kidney disease, stage 3 (moderate): Secondary | ICD-10-CM

## 2016-02-15 DIAGNOSIS — I10 Essential (primary) hypertension: Secondary | ICD-10-CM

## 2016-02-15 LAB — COMPREHENSIVE METABOLIC PANEL
ALBUMIN: 4 g/dL (ref 3.5–5.2)
ALK PHOS: 89 U/L (ref 39–117)
ALT: 35 U/L (ref 0–53)
AST: 48 U/L — AB (ref 0–37)
BUN: 28 mg/dL — AB (ref 6–23)
CHLORIDE: 103 meq/L (ref 96–112)
CO2: 24 mEq/L (ref 19–32)
CREATININE: 1.49 mg/dL (ref 0.40–1.50)
Calcium: 9.7 mg/dL (ref 8.4–10.5)
GFR: 51.49 mL/min — ABNORMAL LOW (ref >60.00)
Glucose, Bld: 201 mg/dL — ABNORMAL HIGH (ref 70–99)
Potassium: 4.2 mEq/L (ref 3.5–5.1)
Sodium: 135 mEq/L (ref 135–145)
TOTAL PROTEIN: 7 g/dL (ref 6.0–8.3)
Total Bilirubin: 0.6 mg/dL (ref 0.2–1.2)

## 2016-02-15 LAB — LIPID PANEL
Cholesterol: 121 mg/dL (ref 0–200)
HDL: 22.2 mg/dL — AB (ref >39.00)
NONHDL: 99.03
Total CHOL/HDL Ratio: 5
Triglycerides: 301 mg/dL — ABNORMAL HIGH (ref 0.0–149.0)
VLDL: 60.2 mg/dL — AB (ref 0.0–40.0)

## 2016-02-15 LAB — CBC WITH DIFFERENTIAL/PLATELET
BASOS ABS: 0 10*3/uL (ref 0.0–0.1)
Basophils Relative: 0.3 % (ref 0.0–3.0)
EOS ABS: 0.2 10*3/uL (ref 0.0–0.7)
Eosinophils Relative: 4.2 % (ref 0.0–5.0)
HCT: 38.9 % — ABNORMAL LOW (ref 39.0–52.0)
HEMOGLOBIN: 13 g/dL (ref 13.0–17.0)
Lymphocytes Relative: 21 % (ref 12.0–46.0)
Lymphs Abs: 1.1 10*3/uL (ref 0.7–4.0)
MCHC: 33.4 g/dL (ref 30.0–36.0)
MCV: 85.6 fl (ref 78.0–100.0)
MONO ABS: 0.5 10*3/uL (ref 0.1–1.0)
Monocytes Relative: 9.5 % (ref 3.0–12.0)
Neutro Abs: 3.3 10*3/uL (ref 1.4–7.7)
Neutrophils Relative %: 65 % (ref 43.0–77.0)
Platelets: 164 10*3/uL (ref 150.0–400.0)
RBC: 4.54 Mil/uL (ref 4.22–5.81)
RDW: 15.4 % (ref 11.5–15.5)
WBC: 5 10*3/uL (ref 4.0–10.5)

## 2016-02-15 LAB — HM DIABETES FOOT EXAM

## 2016-02-15 LAB — LDL CHOLESTEROL, DIRECT: LDL DIRECT: 55 mg/dL

## 2016-02-15 MED ORDER — COLCHICINE 0.6 MG PO TABS: ORAL_TABLET | ORAL | 11 refills | Status: DC

## 2016-02-15 NOTE — Assessment & Plan Note (Signed)
BP Readings from Last 3 Encounters:  02/15/16 124/80  12/28/15 132/88  09/27/15 (!) 142/82   Good control now

## 2016-02-15 NOTE — Addendum Note (Signed)
Addended by: Pilar Grammes on: 02/15/2016 12:10 PM   Modules accepted: Orders

## 2016-02-15 NOTE — Assessment & Plan Note (Signed)
See social history Blank forms given 

## 2016-02-15 NOTE — Assessment & Plan Note (Signed)
No problems with statin 

## 2016-02-15 NOTE — Progress Notes (Signed)
Pre visit review using our clinic review tool, if applicable. No additional management support is needed unless otherwise documented below in the visit note. 

## 2016-02-15 NOTE — Assessment & Plan Note (Signed)
I have personally reviewed the Medicare Annual Wellness questionnaire and have noted 1. The patient's medical and social history 2. Their use of alcohol, tobacco or illicit drugs 3. Their current medications and supplements 4. The patient's functional ability including ADL's, fall risks, home safety risks and hearing or visual             impairment. 5. Diet and physical activities 6. Evidence for depression or mood disorders  The patients weight, height, BMI and visual acuity have been recorded in the chart I have made referrals, counseling and provided education to the patient based review of the above and I have provided the pt with a written personalized care plan for preventive services.  I have provided you with a copy of your personalized plan for preventive services. Please take the time to review along with your updated medication list.  Will do FIT Defer PSA till next year Will take flu and prevnar vaccines Discussed lifestyle

## 2016-02-15 NOTE — Assessment & Plan Note (Signed)
On ACEI Follows with Dr Juleen China

## 2016-02-15 NOTE — Assessment & Plan Note (Signed)
Needs to increase the colchicine to bid when his symptoms are acting up

## 2016-02-15 NOTE — Assessment & Plan Note (Signed)
Healing well under care of the podiatrist

## 2016-02-15 NOTE — Progress Notes (Signed)
Subjective:    Patient ID: Aaron Mckenzie, male    DOB: 10-Jan-1961, 55 y.o.   MRN: [id number]  HPI Here for Medicare wellness and follow up of chronic health conditions Wife here also Reviewed form and advanced directives Reviewed other doctors No alcohol or tobacco Tries to walk and does his yard work No falls Vision is fair--getting shots for diabetic retinopathy Hearing is okay Independent with instrumental ADLs Memory seems fine  More trouble with the gout lately Trouble walking Seems to respond to red meat intake Taking the colchicine regularly--but only once a day Right elbow finally closed up and healed (but still deformed)  Reviewed no NSAIDs Keeps up with Dr Juleen China  Follows with Dr Cruzita Lederer Sugars up and down--- "I'm trying" Hard at this time of the year Mild numbness in feet now  Some trouble swallowing and keeping things down Has to be careful to take small bites and chew well Wife feels he eats too fast No heartburn Doesn't happen if he takes reasonable bites and chews---discussed  No chest pain No SOB No dizziness or syncope No sig edema  Current Outpatient Prescriptions on File Prior to Visit  Medication Sig Dispense Refill  . aspirin EC 81 MG tablet Take 81 mg by mouth daily.    . brimonidine (ALPHAGAN P) 0.1 % SOLN     . COLCRYS 0.6 MG tablet TAKE 1 TABLET(0.6 MG) BY MOUTH TWICE DAILY 60 tablet 5  . furosemide (LASIX) 20 MG tablet TK 1 T PO QD  1  . gabapentin (NEURONTIN) 300 MG capsule TAKE 3 CAPSULES BY MOUTH THREE TIMES DAILY 810 capsule 0  . glucose blood (BAYER CONTOUR NEXT TEST) test strip Use to test blood sugar 2 to 4 times daily as instructed. Dx: E11.41 125 each 5  . HUMULIN R 500 UNIT/ML injection INJECT 12 TO 22 UNITS UNDER THE SKIN THREE TIMES DAILY BEFORE MEALS AS ADVISED 20 mL 0  . insulin regular human CONCENTRATED (HUMULIN R) 500 UNIT/ML injection Inject under the skin 10-16 units before meals, 3x a day, as advised 20 mL 2  .  INSULIN SYRINGE .5CC/29G 29G X 1/2" 0.5 ML MISC USE TWICE DAILY 100 each 0  . INSULIN SYRINGE .5CC/29G 29G X 1/2" 0.5 ML MISC USE TWICE DAILY 100 each 0  . lisinopril (PRINIVIL,ZESTRIL) 20 MG tablet TAKE ONE TABLET BY MOUTH EVERY DAY 90 tablet 3  . pravastatin (PRAVACHOL) 40 MG tablet TAKE 1 TABLET(40 MG) BY MOUTH DAILY 90 tablet 0  . urea (CARMOL) 10 % cream APPLY TOPICALLY DAILY AS NEEDED FOR FEET 85 g 5   No current facility-administered medications on file prior to visit.     No Known Allergies  Past Medical History:  Diagnosis Date  . Diabetes mellitus   . Ectodermal dysplasia   . Hyperlipidemia   . Hypertension   . Pseudogout   . Type 2 diabetes mellitus with neurological manifestations, uncontrolled ([place]) 04/23/2008   Qualifier: Diagnosis of  By: Silvio Pate MD, [facility]     Past Surgical History:  Procedure Laterality Date  . APPENDECTOMY    . RIB FRACTURE SURGERY  1988   right    Family History  Problem Relation Age of Onset  . Arthritis Mother   . Arthritis Father     Social History   Social History  . Marital status: Married    Spouse name: N/A  . Number of children: N/A  . Years of education: N/A   Occupational History  .  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 Smoking status: Never Smoker  . Smokeless tobacco: Never Used  . Alcohol use No  . Drug use: No  . Sexual activity: Not on file   Other Topics Concern  . Not on file   Social History Narrative   No living will or health care POA   Would want wife to make medical decisions for him   Would accept CPR but no prolonged ventilation   Not sure about tube feeds   Review of Systems Appetite is fine Weight is stable--lots of holiday celebrations Sleep is okay.  Nocturia x 1--no daytime problems Wears seat belt. No trouble with teeth No rash or suspicious lesions--other than non infected ulcer on heel (keeping up with podiatrist) Bowels are  fine    Objective:   Physical Exam  Constitutional: He is oriented to person, place, and time. He appears well-developed and well-nourished. No distress.  HENT:  Mouth/Throat: Oropharynx is clear and moist. No oropharyngeal exudate.  Edentulous with upper plate  Neck: No thyromegaly present.  Cardiovascular: Normal rate, regular rhythm, normal heart sounds and intact distal pulses.  Exam reveals no gallop.   No murmur heard. Pulmonary/Chest: Effort normal and breath sounds normal. No respiratory distress. He has no wheezes. He has no rales.  Abdominal: Soft. There is no tenderness.  Musculoskeletal:  1+ edema in feet and calves  Lymphadenopathy:    He has no cervical adenopathy.  Neurological: He is alert and oriented to person, place, and time.  President--- "Dwaine Deter, Bush" [phone] [id number] Recall 3/3  Decreased sensation in feet  Skin:  Small superficial ulcer right heel Marked scaling on feet (not regular with urea cream)  Psychiatric: He has a normal mood and affect. His behavior is normal.          Assessment & Plan:

## 2016-02-15 NOTE — Assessment & Plan Note (Signed)
Control reasonable given the degree of dietary non compliance he describes Urged him to be better with this

## 2016-02-18 ENCOUNTER — Other Ambulatory Visit: Payer: Self-pay | Admitting: Internal Medicine

## 2016-03-01 DIAGNOSIS — L97411 Non-pressure chronic ulcer of right heel and midfoot limited to breakdown of skin: Secondary | ICD-10-CM | POA: Diagnosis not present

## 2016-03-09 ENCOUNTER — Encounter (INDEPENDENT_AMBULATORY_CARE_PROVIDER_SITE_OTHER): Payer: Medicare Other | Admitting: Ophthalmology

## 2016-03-09 DIAGNOSIS — H43813 Vitreous degeneration, bilateral: Secondary | ICD-10-CM | POA: Diagnosis not present

## 2016-03-09 DIAGNOSIS — E11311 Type 2 diabetes mellitus with unspecified diabetic retinopathy with macular edema: Secondary | ICD-10-CM

## 2016-03-09 DIAGNOSIS — E113313 Type 2 diabetes mellitus with moderate nonproliferative diabetic retinopathy with macular edema, bilateral: Secondary | ICD-10-CM

## 2016-03-09 DIAGNOSIS — H35033 Hypertensive retinopathy, bilateral: Secondary | ICD-10-CM

## 2016-03-09 DIAGNOSIS — I10 Essential (primary) hypertension: Secondary | ICD-10-CM

## 2016-03-09 DIAGNOSIS — H2513 Age-related nuclear cataract, bilateral: Secondary | ICD-10-CM

## 2016-03-23 ENCOUNTER — Other Ambulatory Visit (INDEPENDENT_AMBULATORY_CARE_PROVIDER_SITE_OTHER): Payer: Medicare Other

## 2016-03-23 DIAGNOSIS — Z1211 Encounter for screening for malignant neoplasm of colon: Secondary | ICD-10-CM

## 2016-03-23 LAB — FECAL OCCULT BLOOD, IMMUNOCHEMICAL: FECAL OCCULT BLD: NEGATIVE

## 2016-03-28 ENCOUNTER — Ambulatory Visit (INDEPENDENT_AMBULATORY_CARE_PROVIDER_SITE_OTHER): Payer: Medicare Other | Admitting: Internal Medicine

## 2016-03-28 ENCOUNTER — Encounter: Payer: Self-pay | Admitting: Internal Medicine

## 2016-03-28 VITALS — BP 138/88 | HR 91 | Ht 68.5 in | Wt 245.0 lb

## 2016-03-28 DIAGNOSIS — Z794 Long term (current) use of insulin: Secondary | ICD-10-CM | POA: Diagnosis not present

## 2016-03-28 DIAGNOSIS — E1165 Type 2 diabetes mellitus with hyperglycemia: Secondary | ICD-10-CM | POA: Diagnosis not present

## 2016-03-28 DIAGNOSIS — E1142 Type 2 diabetes mellitus with diabetic polyneuropathy: Secondary | ICD-10-CM

## 2016-03-28 DIAGNOSIS — IMO0002 Reserved for concepts with insufficient information to code with codable children: Secondary | ICD-10-CM

## 2016-03-28 LAB — POCT GLYCOSYLATED HEMOGLOBIN (HGB A1C): HEMOGLOBIN A1C: 7.5

## 2016-03-28 NOTE — Addendum Note (Signed)
Addended by: Caprice Beaver T on: 03/28/2016 11:28 AM   Modules accepted: Orders

## 2016-03-28 NOTE — Patient Instructions (Addendum)
Please continue U500 insulin: - 12 units if you plan to be active after the meal - 16 units for a regular meal - 18 units for a large meal >> use this with dinner Sliding scale of insulin: - 150-175: + 1 unit  - 176-200: + 2 units  - 201-225: + 3 units  - > 226: + 4 units   Please return in 3 months with your sugar log.

## 2016-03-28 NOTE — Progress Notes (Signed)
Patient ID: CAMILLE THAU, male   DOB: [birth year], 58 y.o.   MRN: [id number]  HPI: KRISTINA BERTONE is a 56 y.o.-year-old male, returning for f/u for DM2, dx 2004, insulin-dependent since 12/2012, uncontrolled, with complications (CKD, neuropathy, DR). Last visit 3 mo ago.  He had blisters on his R foot >> Sees Dr. Cleda Mccreedy >> now off his boot >> almost healed now.   Last hemoglobin A1c was:  Lab Results  Component Value Date   HGBA1C 7.5 12/28/2015   HGBA1C 7.5 09/27/2015   HGBA1C 6.9 06/25/2015   Pt was on a regimen of: - Lantus 60 >> 70 >> 80 units at bedtime - Novolog 30 >> 40 units bid (does not eat lunch, if he does, takes 20 units) - NovoLog 10 units with snack if it contains >15 g carbs (subtract fiber content from the total nr of carbs) We stopped Glipizide 5 mg bid when we started mealtime insulin We had to stop Metformin 2/2 decreased kidney fxn.  Pt is now on: U500 insulin: - 12 units if you plan to be active after the meal - 16 units for a regular meal - 18 units for a large meal Sliding scale of insulin - added 11/2015 - 150-175: + 1 unit  - 176-200: + 2 units  - 201-225: + 3 units  - > 226: + 4 units   Pt checks sugars 2-3x a day - reviewed log (2 meals a day): - am:95-145, 200 >> 108-188 >> 180-200s >> 92-209, 226 >> 150-200, 235 - before lunch:  84-162 >> 87-115 >> 131, 170 >> n/c >> 223 >> n/c - 2h after lunch: 400, 536 >> n/c  >> 197 >> n/c - before dinner:82, 109-174, 222 >> 140-180 >> 89-180, 216 >> 69, 97-160, 180, 197 - 2h after dinner: 300s >> n/c  - bedtime: 54-118 >> 53, 68, 120, 154 >> 180-190  + lows - at night >> 53 >> 100 >> 89 >> 69; he has hypoglycemia awareness but unknown how low.  Highest CBG 200s (few) >> 222 >> 220 >> 226 >> 235.  Pt's meals are: - Breakfast: eggs, meat, grits, wheat toast - Lunch: may skip or eat late b'fast  - Dinner: chicken/fish/pork chops, 2 sides, salad, baked potatoes - Snacks: 2 a day - apple slices,  popcorn  - + CKD, last BUN/creatinine:  Lab Results  Component Value Date   BUN 28 (H) 02/15/2016   CREATININE 1.49 02/15/2016  On Lisinopril. - last set of lipids: Lab Results  Component Value Date   CHOL 121 02/15/2016   HDL 22.20 (L) 02/15/2016   LDLDIRECT 55.0 02/15/2016   TRIG 301.0 (H) 02/15/2016   CHOLHDL 5 02/15/2016  On Pravastatin. On ASA 81.  - last eye exam was in 06/2015. + DR. - + numbness and tingling in his feet and hands. On neurontin 600-600-900 mg.  He also has a history of HTN, HL, pseudogout, bilateral carpal tunnel s/p sx in each hand: 1985 and 2000.  I reviewed pt's medications, allergies, PMH, social hx, family hx, and changes were documented in the history of present illness. Otherwise, unchanged from my initial visit note.  ROS: Constitutional: no  weight gain, no fatigue, no subjective hyperthermia/hypothermia Eyes: no blurry vision, no xerophthalmia ENT: no sore throat, no nodules palpated in throat, no dysphagia/odynophagia, no hoarseness Cardiovascular: no CP/SOB/palpitations/leg swelling Respiratory: no cough/no SOB Gastrointestinal: no N/V/D/C Musculoskeletal: no muscle/joint aches Skin: no rashes Neurological: no tremors/numbness/tingling/dizziness  PE: BP 138/88 (  BP Location: Left Arm, Patient Position: Sitting)   Pulse 91   Ht 5' 8.5" (1.74 m)   Wt 245 lb (111.1 kg)   SpO2 97%   BMI 36.71 kg/m  Body mass index is 36.71 kg/m. Wt Readings from Last 3 Encounters:  03/28/16 245 lb (111.1 kg)  02/15/16 246 lb (111.6 kg)  12/28/15 244 lb (110.7 kg)   Constitutional: overweight, in NAD Eyes: PERRLA, EOMI, no exophthalmos ENT: moist mucous membranes, no thyromegaly, no cervical lymphadenopathy Cardiovascular: RRR, No MRG, + pitting bilateral leg edema Respiratory: CTA B Gastrointestinal: abdomen soft, NT, ND, BS+ Musculoskeletal: no deformities, strength intact in all 4, R foot in in boot  Skin: moist, warm, no  rashes  ASSESSMENT: 1. DM2, insulin-dependent, uncontrolled, with complications - PN - CKD  PLAN:  1. Patient with long-standing, uncontrolled diabetes, with increased insulin resistance, on U500 insulin. We added a SSI at last visit and increased the dose >> sugars are the same, with higher sugars in am >> will increase U500 with dinner. I advised him to let me know if he develops lows. - I advised him to:  Patient Instructions  Please continue U500 insulin: - 12 units if you plan to be active after the meal - 16 units for a regular meal - 18 units for a large meal >> use this with dinner Sliding scale of insulin: - 150-175: + 1 unit  - 176-200: + 2 units  - 201-225: + 3 units  - > 226: + 4 units   Please return in 3 months with your sugar log.  - continue checking sugars 2 times a day, before meals >> but also advised to check some sugars at bedtime - he is up to date with yearly eye exams >> has DR >> sees Dr Zigmund Daniel  - refused flu shot  - check HbA1c today >> 7.5% (stable) - RTC in 3 mo   Philemon Kingdom, MD PhD [facility] Endocrinology

## 2016-04-05 ENCOUNTER — Other Ambulatory Visit: Payer: Self-pay | Admitting: Internal Medicine

## 2016-04-06 ENCOUNTER — Encounter (INDEPENDENT_AMBULATORY_CARE_PROVIDER_SITE_OTHER): Payer: Medicare Other | Admitting: Ophthalmology

## 2016-04-06 DIAGNOSIS — I10 Essential (primary) hypertension: Secondary | ICD-10-CM | POA: Diagnosis not present

## 2016-04-06 DIAGNOSIS — E113313 Type 2 diabetes mellitus with moderate nonproliferative diabetic retinopathy with macular edema, bilateral: Secondary | ICD-10-CM | POA: Diagnosis not present

## 2016-04-06 DIAGNOSIS — H43813 Vitreous degeneration, bilateral: Secondary | ICD-10-CM

## 2016-04-06 DIAGNOSIS — E11311 Type 2 diabetes mellitus with unspecified diabetic retinopathy with macular edema: Secondary | ICD-10-CM

## 2016-04-06 DIAGNOSIS — H2513 Age-related nuclear cataract, bilateral: Secondary | ICD-10-CM

## 2016-04-06 DIAGNOSIS — H35033 Hypertensive retinopathy, bilateral: Secondary | ICD-10-CM | POA: Diagnosis not present

## 2016-04-10 DIAGNOSIS — N183 Chronic kidney disease, stage 3 (moderate): Secondary | ICD-10-CM | POA: Diagnosis not present

## 2016-04-10 DIAGNOSIS — E559 Vitamin D deficiency, unspecified: Secondary | ICD-10-CM | POA: Diagnosis not present

## 2016-04-10 DIAGNOSIS — M109 Gout, unspecified: Secondary | ICD-10-CM | POA: Diagnosis not present

## 2016-04-10 DIAGNOSIS — R809 Proteinuria, unspecified: Secondary | ICD-10-CM | POA: Diagnosis not present

## 2016-04-10 DIAGNOSIS — E1122 Type 2 diabetes mellitus with diabetic chronic kidney disease: Secondary | ICD-10-CM | POA: Diagnosis not present

## 2016-04-10 DIAGNOSIS — I129 Hypertensive chronic kidney disease with stage 1 through stage 4 chronic kidney disease, or unspecified chronic kidney disease: Secondary | ICD-10-CM | POA: Diagnosis not present

## 2016-04-12 DIAGNOSIS — B351 Tinea unguium: Secondary | ICD-10-CM | POA: Diagnosis not present

## 2016-04-12 DIAGNOSIS — E1140 Type 2 diabetes mellitus with diabetic neuropathy, unspecified: Secondary | ICD-10-CM | POA: Diagnosis not present

## 2016-04-12 DIAGNOSIS — L851 Acquired keratosis [keratoderma] palmaris et plantaris: Secondary | ICD-10-CM | POA: Diagnosis not present

## 2016-04-12 DIAGNOSIS — Z794 Long term (current) use of insulin: Secondary | ICD-10-CM | POA: Diagnosis not present

## 2016-05-04 ENCOUNTER — Encounter (INDEPENDENT_AMBULATORY_CARE_PROVIDER_SITE_OTHER): Payer: Medicare Other | Admitting: Ophthalmology

## 2016-05-11 ENCOUNTER — Encounter (INDEPENDENT_AMBULATORY_CARE_PROVIDER_SITE_OTHER): Payer: Medicare Other | Admitting: Ophthalmology

## 2016-05-11 DIAGNOSIS — I10 Essential (primary) hypertension: Secondary | ICD-10-CM | POA: Diagnosis not present

## 2016-05-11 DIAGNOSIS — E113313 Type 2 diabetes mellitus with moderate nonproliferative diabetic retinopathy with macular edema, bilateral: Secondary | ICD-10-CM

## 2016-05-11 DIAGNOSIS — H2513 Age-related nuclear cataract, bilateral: Secondary | ICD-10-CM

## 2016-05-11 DIAGNOSIS — H35033 Hypertensive retinopathy, bilateral: Secondary | ICD-10-CM

## 2016-05-11 DIAGNOSIS — E11311 Type 2 diabetes mellitus with unspecified diabetic retinopathy with macular edema: Secondary | ICD-10-CM

## 2016-05-11 DIAGNOSIS — H43813 Vitreous degeneration, bilateral: Secondary | ICD-10-CM

## 2016-05-22 ENCOUNTER — Other Ambulatory Visit: Payer: Self-pay | Admitting: Internal Medicine

## 2016-05-22 MED ORDER — GABAPENTIN 300 MG PO CAPS: 900.0000 mg | ORAL_CAPSULE | Freq: Three times a day (TID) | ORAL | 2 refills | Status: DC

## 2016-05-22 MED ORDER — INSULIN REGULAR HUMAN (CONC) 500 UNIT/ML ~~LOC~~ SOLN: SUBCUTANEOUS | 1 refills | Status: DC

## 2016-06-04 ENCOUNTER — Other Ambulatory Visit: Payer: Self-pay | Admitting: Internal Medicine

## 2016-06-09 ENCOUNTER — Encounter (INDEPENDENT_AMBULATORY_CARE_PROVIDER_SITE_OTHER): Payer: Medicare Other | Admitting: Ophthalmology

## 2016-06-15 ENCOUNTER — Encounter (INDEPENDENT_AMBULATORY_CARE_PROVIDER_SITE_OTHER): Payer: Medicare Other | Admitting: Ophthalmology

## 2016-06-15 DIAGNOSIS — I10 Essential (primary) hypertension: Secondary | ICD-10-CM | POA: Diagnosis not present

## 2016-06-15 DIAGNOSIS — E113313 Type 2 diabetes mellitus with moderate nonproliferative diabetic retinopathy with macular edema, bilateral: Secondary | ICD-10-CM | POA: Diagnosis not present

## 2016-06-15 DIAGNOSIS — H43813 Vitreous degeneration, bilateral: Secondary | ICD-10-CM

## 2016-06-15 DIAGNOSIS — H35033 Hypertensive retinopathy, bilateral: Secondary | ICD-10-CM | POA: Diagnosis not present

## 2016-06-15 DIAGNOSIS — E11311 Type 2 diabetes mellitus with unspecified diabetic retinopathy with macular edema: Secondary | ICD-10-CM | POA: Diagnosis not present

## 2016-06-20 ENCOUNTER — Ambulatory Visit (INDEPENDENT_AMBULATORY_CARE_PROVIDER_SITE_OTHER): Payer: Medicare Other | Admitting: Internal Medicine

## 2016-06-20 ENCOUNTER — Encounter: Payer: Self-pay | Admitting: Internal Medicine

## 2016-06-20 VITALS — BP 130/84 | HR 93 | Ht 69.0 in | Wt 249.0 lb

## 2016-06-20 DIAGNOSIS — E1142 Type 2 diabetes mellitus with diabetic polyneuropathy: Secondary | ICD-10-CM

## 2016-06-20 DIAGNOSIS — E1165 Type 2 diabetes mellitus with hyperglycemia: Secondary | ICD-10-CM

## 2016-06-20 DIAGNOSIS — Z794 Long term (current) use of insulin: Secondary | ICD-10-CM

## 2016-06-20 DIAGNOSIS — IMO0002 Reserved for concepts with insufficient information to code with codable children: Secondary | ICD-10-CM

## 2016-06-20 LAB — POCT GLYCOSYLATED HEMOGLOBIN (HGB A1C): Hemoglobin A1C: 8.3

## 2016-06-20 NOTE — Patient Instructions (Addendum)
Please increase U500 insulin: - 20 units before a smaller meal - 22 units before a larger meal.  Sliding scale of insulin: - 150-175: + 1 unit  - 176-200: + 2 units  - 201-225: + 3 units  - > 226: + 4 units   Read the following books: Dr. Alyssa Grove - Program for Reversing Diabetes Dr. Karl Luke - Prevent and Reverse Heart Disease Dr. Alden Benjamin - How Not to Die  Please return in 3 months with your sugar log.

## 2016-06-20 NOTE — Progress Notes (Signed)
Patient ID: Aaron Mckenzie, male   DOB: 1960/10/10, 56 y.o.   MRN: [id number]  HPI: Aaron Mckenzie is a 56 y.o.-year-old male, returning for f/u for DM2, dx 2004, insulin-dependent since 12/2012, uncontrolled, with complications (CKD, neuropathy, DR). Last visit 3 mo ago. He is here with his wife who offers part of the hx.  Last hemoglobin A1c was:  Lab Results  Component Value Date   HGBA1C 7.5 03/28/2016   HGBA1C 7.5 12/28/2015   HGBA1C 7.5 09/27/2015   Pt was on a regimen of: - Lantus 60 >> 70 >> 80 units at bedtime - Novolog 30 >> 40 units bid (does not eat lunch, if he does, takes 20 units) - NovoLog 10 units with snack if it contains >15 g carbs (subtract fiber content from the total nr of carbs) We stopped Glipizide 5 mg bid when we started mealtime insulin We had to stop Metformin 2/2 decreased kidney fxn.  Pt is now on: U500 insulin:  - 18 units for a large meal (use this with all meals now) Sliding scale of insulin: - 150-175: + 1 unit  - 176-200: + 2 units  - 201-225: + 3 units  - > 226: + 4 units   Pt checks sugars 2-3x a day - reviewed log (2 meals a day): - am:95-145, 200 >> 108-188 >> 180-200s >> 92-209, 226 >> 150-200, 235 >> 164-231 - before lunch:  84-162 >> 87-115 >> 131, 170 >> n/c >> 223 >> n/c - 2h after lunch: 400, 536 >> n/c  >> 197 >> n/c - before dinner:82, 109-174, 222 >> 140-180 >> 89-180, 216 >> 69, 97-160, 180, 197 >> 122-163, 191, 252 - 2h after dinner: 300s >> n/c  - bedtime: 54-118 >> 53, 68, 120, 154 >> 180-190 >> n/c + lows - at night >> 53 >> 100 >> 89 >> 69 >> 122; he has hypoglycemia awareness but unknown how low.  Highest CBG 200s (few) >> 222 >> 220 >> 226 >> 235 >> 252.  Pt's meals are: - Breakfast: eggs, meat, grits, wheat toast - Lunch: may skip or eat late b'fast  - Dinner: chicken/fish/pork chops, 2 sides, salad, baked potatoes - Snacks: 2 a day - apple slices, popcorn  - + CKD, last BUN/creatinine:  Lab Results   Component Value Date   BUN 28 (H) 02/15/2016   CREATININE 1.49 02/15/2016  On Lisinopril. - last set of lipids: Lab Results  Component Value Date   CHOL 121 02/15/2016   HDL 22.20 (L) 02/15/2016   LDLDIRECT 55.0 02/15/2016   TRIG 301.0 (H) 02/15/2016   CHOLHDL 5 02/15/2016  On Pravastatin. On ASA 81.  - last eye exam was in 06/2015. + DR.. - + numbness and tingling in his feet and hands. On neurontin 600-600-900 mg.  He also has a history of HTN, HL, pseudogout, bilateral carpal tunnel s/p sx in each hand: 1985 and 2000.  I reviewed pt's medications, allergies, PMH, social hx, family hx, and changes were documented in the history of present illness. Otherwise, unchanged from my initial visit note.  ROS: Constitutional: no weight gain/loss, no fatigue, no subjective hyperthermia/hypothermia Eyes: no blurry vision, no xerophthalmia ENT: no sore throat, no nodules palpated in throat, no dysphagia/odynophagia, no hoarseness Cardiovascular: no CP/SOB/palpitations/leg swelling Respiratory: no cough/SOB Gastrointestinal: no N/V/D/C Musculoskeletal: no muscle/joint aches Skin: no rashes Neurological: no tremors/numbness/tingling/dizziness  I reviewed pt's medications, allergies, PMH, social hx, family hx, and changes were documented in the history of present  illness. Otherwise, unchanged from my initial visit note.  PE: BP 130/84 (BP Location: Left Arm, Patient Position: Sitting)   Pulse 93   Ht 5\' 9"  (1.753 m)   Wt 249 lb (112.9 kg)   SpO2 97%   BMI 36.77 kg/m  Body mass index is 36.77 kg/m. Wt Readings from Last 3 Encounters:  06/20/16 249 lb (112.9 kg)  03/28/16 245 lb (111.1 kg)  02/15/16 246 lb (111.6 kg)   Constitutional: overweight, in NAD Eyes: PERRLA, EOMI, no exophthalmos ENT: moist mucous membranes, no thyromegaly, no cervical lymphadenopathy Cardiovascular: RRR, No MRG,  + pitting bilateral leg edema Respiratory: CTA B Gastrointestinal: abdomen soft, NT, ND,  BS+ Musculoskeletal: strength intact in all 4, no deformities Skin: moist, warm, no rashes Neurological: no tremor with outstretched hands, DTR normal in all 4  ASSESSMENT: 1. DM2, insulin-dependent, uncontrolled, with complications - increased insulin resistance - PN - CKD  PLAN:  1. Patient with long-standing, uncontrolled diabetes, with increased insulin resistance, on U500 insulin. His sugars are higher than target despite using a higher dose of U500 c/w last visit. He also continues to gain weight. At this visit, I again discussed with him and his wife the importance of diet in managing his diabetes. We are now In a vicious circle of high sugars-increasing U500 insulin-weight gain-increased insulin resistance-high sugars, which needs to be broken and the best way to do this is by radical improving his diet. I did my best explaining how to change his diet and I also suggested some references. In the meantime, we need to increase his U500 insulin doses. - I advised him to:  Patient Instructions  Please increase U500 insulin: - 20 units before a smaller meal - 22 units before a larger meal.  Sliding scale of insulin: - 150-175: + 1 unit  - 176-200: + 2 units  - 201-225: + 3 units  - > 226: + 4 units   Read the following books: Dr. Alyssa Grove - Program for Reversing Diabetes Dr. Karl Luke - Prevent and Reverse Heart Disease Dr. Alden Benjamin - How Not to Die  Please return in 3 months with your sugar log.  - continue checking sugars 2 times a day, before meals >> but also advised to check some sugars at bedtime - He is up-to-date with his yearly eye exams-Dr. Zigmund Daniel. He does have DR. - Refused flu shot in the past - check HbA1c today >> 8.3% (higher) - RTC in 3 mo   Philemon Kingdom, MD PhD [facility] Endocrinology

## 2016-07-10 DIAGNOSIS — L851 Acquired keratosis [keratoderma] palmaris et plantaris: Secondary | ICD-10-CM | POA: Diagnosis not present

## 2016-07-10 DIAGNOSIS — Z794 Long term (current) use of insulin: Secondary | ICD-10-CM | POA: Diagnosis not present

## 2016-07-10 DIAGNOSIS — E1140 Type 2 diabetes mellitus with diabetic neuropathy, unspecified: Secondary | ICD-10-CM | POA: Diagnosis not present

## 2016-07-10 DIAGNOSIS — B351 Tinea unguium: Secondary | ICD-10-CM | POA: Diagnosis not present

## 2016-07-11 ENCOUNTER — Other Ambulatory Visit: Payer: Self-pay | Admitting: Internal Medicine

## 2016-07-20 ENCOUNTER — Encounter (INDEPENDENT_AMBULATORY_CARE_PROVIDER_SITE_OTHER): Payer: Medicare Other | Admitting: Ophthalmology

## 2016-07-20 DIAGNOSIS — E11311 Type 2 diabetes mellitus with unspecified diabetic retinopathy with macular edema: Secondary | ICD-10-CM | POA: Diagnosis not present

## 2016-07-20 DIAGNOSIS — I10 Essential (primary) hypertension: Secondary | ICD-10-CM | POA: Diagnosis not present

## 2016-07-20 DIAGNOSIS — H43813 Vitreous degeneration, bilateral: Secondary | ICD-10-CM | POA: Diagnosis not present

## 2016-07-20 DIAGNOSIS — E113313 Type 2 diabetes mellitus with moderate nonproliferative diabetic retinopathy with macular edema, bilateral: Secondary | ICD-10-CM

## 2016-07-20 DIAGNOSIS — H35033 Hypertensive retinopathy, bilateral: Secondary | ICD-10-CM | POA: Diagnosis not present

## 2016-07-20 DIAGNOSIS — H2513 Age-related nuclear cataract, bilateral: Secondary | ICD-10-CM

## 2016-07-28 HISTORY — PX: CATARACT EXTRACTION: SUR2

## 2016-08-16 DIAGNOSIS — H40013 Open angle with borderline findings, low risk, bilateral: Secondary | ICD-10-CM | POA: Diagnosis not present

## 2016-08-16 DIAGNOSIS — H2513 Age-related nuclear cataract, bilateral: Secondary | ICD-10-CM | POA: Diagnosis not present

## 2016-08-16 DIAGNOSIS — H40033 Anatomical narrow angle, bilateral: Secondary | ICD-10-CM | POA: Diagnosis not present

## 2016-08-16 DIAGNOSIS — E113313 Type 2 diabetes mellitus with moderate nonproliferative diabetic retinopathy with macular edema, bilateral: Secondary | ICD-10-CM | POA: Diagnosis not present

## 2016-08-21 ENCOUNTER — Encounter (INDEPENDENT_AMBULATORY_CARE_PROVIDER_SITE_OTHER): Payer: Medicare Other | Admitting: Ophthalmology

## 2016-08-21 DIAGNOSIS — I10 Essential (primary) hypertension: Secondary | ICD-10-CM | POA: Diagnosis not present

## 2016-08-21 DIAGNOSIS — H35033 Hypertensive retinopathy, bilateral: Secondary | ICD-10-CM

## 2016-08-21 DIAGNOSIS — H43813 Vitreous degeneration, bilateral: Secondary | ICD-10-CM | POA: Diagnosis not present

## 2016-08-21 DIAGNOSIS — E113313 Type 2 diabetes mellitus with moderate nonproliferative diabetic retinopathy with macular edema, bilateral: Secondary | ICD-10-CM | POA: Diagnosis not present

## 2016-08-21 DIAGNOSIS — E11311 Type 2 diabetes mellitus with unspecified diabetic retinopathy with macular edema: Secondary | ICD-10-CM | POA: Diagnosis not present

## 2016-09-11 ENCOUNTER — Other Ambulatory Visit: Payer: Self-pay | Admitting: Internal Medicine

## 2016-09-18 ENCOUNTER — Encounter (INDEPENDENT_AMBULATORY_CARE_PROVIDER_SITE_OTHER): Payer: Medicare Other | Admitting: Ophthalmology

## 2016-09-18 DIAGNOSIS — E11311 Type 2 diabetes mellitus with unspecified diabetic retinopathy with macular edema: Secondary | ICD-10-CM | POA: Diagnosis not present

## 2016-09-18 DIAGNOSIS — E113313 Type 2 diabetes mellitus with moderate nonproliferative diabetic retinopathy with macular edema, bilateral: Secondary | ICD-10-CM

## 2016-09-18 DIAGNOSIS — H43813 Vitreous degeneration, bilateral: Secondary | ICD-10-CM

## 2016-09-18 DIAGNOSIS — I10 Essential (primary) hypertension: Secondary | ICD-10-CM | POA: Diagnosis not present

## 2016-09-18 DIAGNOSIS — H35033 Hypertensive retinopathy, bilateral: Secondary | ICD-10-CM

## 2016-09-21 DIAGNOSIS — H2512 Age-related nuclear cataract, left eye: Secondary | ICD-10-CM | POA: Diagnosis not present

## 2016-09-21 DIAGNOSIS — H268 Other specified cataract: Secondary | ICD-10-CM | POA: Diagnosis not present

## 2016-09-26 ENCOUNTER — Ambulatory Visit (INDEPENDENT_AMBULATORY_CARE_PROVIDER_SITE_OTHER): Payer: Medicare Other | Admitting: Internal Medicine

## 2016-09-26 ENCOUNTER — Encounter: Payer: Self-pay | Admitting: Internal Medicine

## 2016-09-26 VITALS — BP 138/90 | HR 85 | Ht 69.0 in | Wt 247.0 lb

## 2016-09-26 DIAGNOSIS — E1142 Type 2 diabetes mellitus with diabetic polyneuropathy: Secondary | ICD-10-CM

## 2016-09-26 DIAGNOSIS — Z794 Long term (current) use of insulin: Secondary | ICD-10-CM | POA: Diagnosis not present

## 2016-09-26 DIAGNOSIS — E1165 Type 2 diabetes mellitus with hyperglycemia: Secondary | ICD-10-CM

## 2016-09-26 DIAGNOSIS — IMO0002 Reserved for concepts with insufficient information to code with codable children: Secondary | ICD-10-CM

## 2016-09-26 LAB — POCT GLYCOSYLATED HEMOGLOBIN (HGB A1C): Hemoglobin A1C: 9.9

## 2016-09-26 MED ORDER — INSULIN NPH (HUMAN) (ISOPHANE) 100 UNIT/ML ~~LOC~~ SUSP: SUBCUTANEOUS | 11 refills | Status: DC

## 2016-09-26 MED ORDER — INSULIN REGULAR HUMAN 100 UNIT/ML IJ SOLN: INTRAMUSCULAR | 11 refills | Status: DC

## 2016-09-26 NOTE — Addendum Note (Signed)
Addended by: Caprice Beaver T on: 09/26/2016 03:20 PM   Modules accepted: Orders

## 2016-09-26 NOTE — Progress Notes (Signed)
Patient ID: Aaron Mckenzie, male   DOB: 11-07-1960, 56 y.o.   MRN: [id number]  HPI: Aaron Mckenzie is a 56 y.o.-year-old male, returning for f/u for DM2, dx 2004, insulin-dependent since 12/2012, uncontrolled, with complications (CKD, neuropathy, DR). Last visit 3 mo ago.  He is here with his wife who offers part of the hx especially about meals and insulin dosing.  Last hemoglobin A1c was:  Lab Results  Component Value Date   HGBA1C 8.3 06/20/2016   HGBA1C 7.5 03/28/2016   HGBA1C 7.5 12/28/2015   Pt was on a regimen of: - Lantus 60 >> 70 >> 80 units at bedtime - Novolog 30 >> 40 units bid (does not eat lunch, if he does, takes 20 units) - NovoLog 10 units with snack if it contains >15 g carbs (subtract fiber content from the total nr of carbs) We stopped Glipizide 5 mg bid when we started mealtime insulin We had to stop Metformin 2/2 decreased kidney fxn.  Pt is now on: U500 insulin: - 20 units before a smaller meal - 22 units before a larger meal. Sliding scale of insulin: - 150-175: + 1 unit  - 176-200: + 2 units  - 201-225: + 3 units  - > 226: + 4 units   Pt checks sugars 2-3x a day - has 2 meals a day - per log: - am:180-200s >> 92-209, 226 >> 150-200, 235 >> 164-231 >> 136, 150-225, 262 - before lunch:  84-162 >> 87-115 >> 131, 170 >> n/c >> 223 >> n/c - 2h after lunch: 400, 536 >> n/c  >> 197 >> n/c - before dinner: 69, 97-160, 180, 197 >> 122-163, 191, 252 >> 120-160, 269, 327 - 2h after dinner: 300s >> n/c  - bedtime: 54-118 >> 53, 68, 120, 154 >> 180-190 >> n/c Lowest: 122 >> 100; he has hypoglycemia awareness but unknown how low.  Highest: 400s - ate cereals and did not take insulin.  Pt's meals are: - Breakfast: eggs, meat, grits, wheat toast - Lunch: may skip or eat late b'fast  - Dinner: chicken/fish/pork chops, 2 sides, salad, baked potatoes - Snacks: 2x a day - apple slices, popcorn  - He has CKD, last BUN/creatinine:  Lab Results  Component Value  Date   BUN 28 (H) 02/15/2016   CREATININE 1.49 02/15/2016  On Lisinopril. - last set of lipids: Lab Results  Component Value Date   CHOL 121 02/15/2016   HDL 22.20 (L) 02/15/2016   LDLDIRECT 55.0 02/15/2016   TRIG 301.0 (H) 02/15/2016   CHOLHDL 5 02/15/2016  On Pravastatin. On ASA 81.  - last eye exam was in 06/2015 >> + DR. He saw Dr. Katy Fitch >> had cataract sx 08/2016 - he does have numbness and tingling in his feet and hands. On neurontin 600-600-900 mg.  He also has a history of HTN, HL, pseudogout, bilateral carpal tunnel s/p sx in each hand: 1985 and 2000.  ROS: Constitutional: no weight gain/no weight loss, no fatigue, no subjective hyperthermia, no subjective hypothermia Eyes: no blurry vision, no xerophthalmia ENT: no sore throat, no nodules palpated in throat, no dysphagia, no odynophagia, no hoarseness Cardiovascular: no CP/no SOB/no palpitations/no leg swelling Respiratory: no cough/no SOB/no wheezing Gastrointestinal: no N/no V/no D/no C/no acid reflux Musculoskeletal: no muscle aches/no joint aches Skin: no rashes, no hair loss Neurological: no tremors/no dizziness  I reviewed pt's medications, allergies, PMH, social hx, family hx, and changes were documented in the history of present illness. Otherwise, unchanged  from my initial visit note.  PE: Ht 5\' 9"  (1.753 m)   Wt 247 lb (112 kg)   BMI 36.48 kg/m  Body mass index is 36.48 kg/m. Wt Readings from Last 3 Encounters:  09/26/16 247 lb (112 kg)  06/20/16 249 lb (112.9 kg)  03/28/16 245 lb (111.1 kg)   Constitutional: overweight, in NAD Eyes: PERRLA, EOMI, no exophthalmos ENT: moist mucous membranes, no thyromegaly, no cervical lymphadenopathy Cardiovascular: RRR, No MRG, + B pitting edema Respiratory: CTA B Gastrointestinal: abdomen soft, NT, ND, BS+ Musculoskeletal: no deformities, strength intact in all 4 Skin: moist, warm, no rashes Neurological: no tremor with outstretched hands, DTR normal in all  4  ASSESSMENT: 1. DM2, insulin-dependent, uncontrolled, with complications - increased insulin resistance - PN  -stable - CKD  PLAN:  1. Patient with long-standing, uncontrolled diabetes, with very high insulin resistance, getting worse >> sugars high and a HbA1c was very high, at 9.9%. At this visit, he tells me he cannot afford U500 anymore >> will need to switch to N and R insulin. He is on a total dose of 300 units U500 insulin a day. Will start at a lower dose of U100 >> will need to increase. - discussed how to correctly mix the insulins - he will need to call me with sugars in few days - I advised him to:  Patient Instructions   Please stop U500 insulin and start N and R insulin (ReliOn):  Insulin Before brunch Before dinner  Regular 50 40  NPH 60 50  Please inject the insulin 30 min before meals.  If you continue on the U500 insulin, please increase the doses by 4 units.  Please return in 1.5 months with your sugar log.  - continue checking sugars at different times of the day - check 3x a day, rotating checks - advised for yearly eye exams >> he needs one - Return to clinic in 3 mo with sugar log   Philemon Kingdom, MD PhD [facility] Endocrinology

## 2016-09-26 NOTE — Patient Instructions (Addendum)
Please stop U500 insulin and start N and R insulin (ReliOn):  Insulin Before brunch Before dinner  Regular 50 40  NPH 60 50  Please inject the insulin 30 min before meals.  If you continue on the U500 insulin, please increase the doses by 4 units.  Please return in 1.5 months with your sugar log.

## 2016-10-10 DIAGNOSIS — H4720 Unspecified optic atrophy: Secondary | ICD-10-CM | POA: Diagnosis not present

## 2016-10-16 ENCOUNTER — Encounter (INDEPENDENT_AMBULATORY_CARE_PROVIDER_SITE_OTHER): Payer: Medicare Other | Admitting: Ophthalmology

## 2016-10-16 DIAGNOSIS — H35033 Hypertensive retinopathy, bilateral: Secondary | ICD-10-CM | POA: Diagnosis not present

## 2016-10-16 DIAGNOSIS — H2511 Age-related nuclear cataract, right eye: Secondary | ICD-10-CM | POA: Diagnosis not present

## 2016-10-16 DIAGNOSIS — E113313 Type 2 diabetes mellitus with moderate nonproliferative diabetic retinopathy with macular edema, bilateral: Secondary | ICD-10-CM | POA: Diagnosis not present

## 2016-10-16 DIAGNOSIS — I10 Essential (primary) hypertension: Secondary | ICD-10-CM | POA: Diagnosis not present

## 2016-10-16 DIAGNOSIS — E11311 Type 2 diabetes mellitus with unspecified diabetic retinopathy with macular edema: Secondary | ICD-10-CM

## 2016-10-16 DIAGNOSIS — H43813 Vitreous degeneration, bilateral: Secondary | ICD-10-CM | POA: Diagnosis not present

## 2016-10-24 DIAGNOSIS — B351 Tinea unguium: Secondary | ICD-10-CM | POA: Diagnosis not present

## 2016-10-24 DIAGNOSIS — E1140 Type 2 diabetes mellitus with diabetic neuropathy, unspecified: Secondary | ICD-10-CM | POA: Diagnosis not present

## 2016-10-24 DIAGNOSIS — Z794 Long term (current) use of insulin: Secondary | ICD-10-CM | POA: Diagnosis not present

## 2016-10-24 DIAGNOSIS — L851 Acquired keratosis [keratoderma] palmaris et plantaris: Secondary | ICD-10-CM | POA: Diagnosis not present

## 2016-11-13 ENCOUNTER — Encounter (INDEPENDENT_AMBULATORY_CARE_PROVIDER_SITE_OTHER): Payer: Medicare Other | Admitting: Ophthalmology

## 2016-11-13 DIAGNOSIS — E11311 Type 2 diabetes mellitus with unspecified diabetic retinopathy with macular edema: Secondary | ICD-10-CM

## 2016-11-13 DIAGNOSIS — H43813 Vitreous degeneration, bilateral: Secondary | ICD-10-CM

## 2016-11-13 DIAGNOSIS — E113313 Type 2 diabetes mellitus with moderate nonproliferative diabetic retinopathy with macular edema, bilateral: Secondary | ICD-10-CM

## 2016-11-13 DIAGNOSIS — H35033 Hypertensive retinopathy, bilateral: Secondary | ICD-10-CM | POA: Diagnosis not present

## 2016-11-13 DIAGNOSIS — I10 Essential (primary) hypertension: Secondary | ICD-10-CM | POA: Diagnosis not present

## 2016-11-27 ENCOUNTER — Encounter: Payer: Self-pay | Admitting: Internal Medicine

## 2016-11-27 ENCOUNTER — Ambulatory Visit (INDEPENDENT_AMBULATORY_CARE_PROVIDER_SITE_OTHER): Payer: Medicare Other | Admitting: Internal Medicine

## 2016-11-27 VITALS — BP 138/88 | HR 83 | Wt 247.0 lb

## 2016-11-27 DIAGNOSIS — E1165 Type 2 diabetes mellitus with hyperglycemia: Secondary | ICD-10-CM | POA: Diagnosis not present

## 2016-11-27 DIAGNOSIS — E785 Hyperlipidemia, unspecified: Secondary | ICD-10-CM

## 2016-11-27 DIAGNOSIS — E1142 Type 2 diabetes mellitus with diabetic polyneuropathy: Secondary | ICD-10-CM | POA: Diagnosis not present

## 2016-11-27 DIAGNOSIS — Z794 Long term (current) use of insulin: Secondary | ICD-10-CM | POA: Diagnosis not present

## 2016-11-27 DIAGNOSIS — IMO0002 Reserved for concepts with insufficient information to code with codable children: Secondary | ICD-10-CM

## 2016-11-27 MED ORDER — INSULIN NPH ISOPHANE & REGULAR (70-30) 100 UNIT/ML ~~LOC~~ SUSP
SUBCUTANEOUS | 11 refills | Status: DC
Start: 2016-11-27 — End: 2017-01-16

## 2016-11-27 NOTE — Progress Notes (Signed)
Patient ID: Aaron Mckenzie, male   DOB: 10-14-1960, 56 y.o.   MRN: [id number]  HPI: Aaron Mckenzie is a 56 y.o.-year-old male, returning for f/u for DM2, dx 2004, insulin-dependent since 12/2012, uncontrolled, with complications (CKD, neuropathy, DR). Last visit 2 mo ago.   Last hemoglobin A1c was:  Lab Results  Component Value Date   HGBA1C 9.9 09/26/2016   HGBA1C 8.3 06/20/2016   HGBA1C 7.5 03/28/2016   Pt was on: U500 insulin: - 20 units before a smaller meal - 22 units before a larger meal. Sliding scale of insulin: - 150-175: + 1 unit  - 176-200: + 2 units  - 201-225: + 3 units  - > 226: + 4 units  We stopped Glipizide 5 mg bid when we started mealtime insulin We had to stop Metformin 2/2 decreased kidney fxn.  At last visit, we had to change to: N and R insulin (ReliOn): Insulin Before brunch Before dinner  Regular 50 40  NPH 60  50    However, he is now on: - ReliOn 70/30 40 units 2x a day  Pt checks sugars 2x a day (has 2 meals a day): - am: 164-231 >> 136, 150-225, 262 >> 168-199, 205, 209 - before lunch: 131, 170 >> n/c >> 223 >> n/c - 2h after lunch: 400, 536 >> n/c  >> 197 >> n/c - before dinner: 122-163, 191, 252 >> 120-160, 269, 327 >> 120-144 - 2h after dinner: 300s >> n/c  - bedtime: 54-118 >> 53, 68, 120, 154 >> 180-190 >> n/c Lowest: 122 >> 100 >> 120 Highest: 400s - ate cereals and did not take insulin >> 209  Pt's meals are: - Breakfast: eggs, meat, grits, wheat toast - Lunch: may skip or eat late b'fast  - Dinner: chicken/fish/pork chops, 2 sides, salad, baked potatoes - Snacks: 2x a day - apple slices, popcorn  - He has CKD, last BUN/creatinine:  Lab Results  Component Value Date   BUN 28 (H) 02/15/2016   CREATININE 1.49 02/15/2016  On Lisinopril. - last set of lipids: Lab Results  Component Value Date   CHOL 121 02/15/2016   HDL 22.20 (L) 02/15/2016   LDLDIRECT 55.0 02/15/2016   TRIG 301.0 (H) 02/15/2016   CHOLHDL 5 02/15/2016   On pRAVASTATIN. On ASA 81. - last eye exam was in 10/2016 >> + DR. He saw Dr. Katy Fitch >> had cataract sx 08/2016 - + numbness and tingling in his feet and hands. On neurontin 600-600-900 mg.  He also has a history of HTN, HL, pseudogout, bilateral carpal tunnel s/p sx in each hand: 1985 and 2000.  ROS: Constitutional: no weight gain/no weight loss, no fatigue, no subjective hyperthermia, no subjective hypothermia Eyes: no blurry vision, no xerophthalmia ENT: no sore throat, no nodules palpated in throat, no dysphagia, no odynophagia, no hoarseness Cardiovascular: no CP/no SOB/no palpitations/+ leg swelling Respiratory: no cough/no SOB/no wheezing Gastrointestinal: no N/no V/no D/no C/no acid reflux Musculoskeletal: no muscle aches/no joint aches Skin: no rashes, no hair loss Neurological: no tremors/+ numbness/+ tingling/no dizziness  I reviewed pt's medications, allergies, PMH, social hx, family hx, and changes were documented in the history of present illness. Otherwise, unchanged from my initial visit note.  PE: BP 138/88 (BP Location: Left Arm, Patient Position: Sitting)   Pulse 83   Wt 247 lb (112 kg)   SpO2 98%   BMI 36.48 kg/m  Body mass index is 36.48 kg/m. Wt Readings from Last 3 Encounters:  11/27/16  247 lb (112 kg)  09/26/16 247 lb (112 kg)  06/20/16 249 lb (112.9 kg)   Constitutional: overweight, in NAD Eyes: PERRLA, EOMI, no exophthalmos ENT: moist mucous membranes, no thyromegaly, no cervical lymphadenopathy Cardiovascular: RRR, No MRG, + B pitting edema Respiratory: CTA B Gastrointestinal: abdomen soft, NT, ND, BS+ Musculoskeletal: no deformities, strength intact in all 4 Skin: moist, warm, no rashes Neurological: no tremor with outstretched hands, DTR normal in all 4  ASSESSMENT: 1. DM2, insulin-dependent, uncontrolled, with complications - increased insulin resistance - PN  -stable - CKD  2. Hl  PLAN:  1. Patient with long-standing, uncontrolled  DM, with better control since last visit, despite changing his insulin regimen to 70/30 premixed insulin rather than the N and R recommended... His sugars before dinner are almost at goal, while the ones in am are high >> will increase pm insulin dose - I advised him to:  Patient Instructions   Please change the 70/30 insulin doses to: - 40 units before b'fast - 50 units before dinner  Please let me know if the sugars are consistently <80 or >200.  Please return in 1.5 months with your sugar log.   - continue checking sugars at different times of the day - check 3x a day, rotating checks - advised for yearly eye exams >> he is UTD - refuses flu vaccine - Return to clinic in 1.5 mo with sugar log   2. HL - reviewed latest panel >> LDL at goal, but high TG. This will also improve with improving DM further.  - continue Pravastatin   Philemon Kingdom, MD PhD [facility] Endocrinology

## 2016-11-27 NOTE — Patient Instructions (Addendum)
  Please change the 70/30 insulin doses to: - 40 units before b'fast - 50 units before dinner  Please let me know if the sugars are consistently <80 or >200.  Please return in 1.5 months with your sugar log.

## 2016-11-28 DIAGNOSIS — N183 Chronic kidney disease, stage 3 (moderate): Secondary | ICD-10-CM | POA: Diagnosis not present

## 2016-11-28 DIAGNOSIS — R809 Proteinuria, unspecified: Secondary | ICD-10-CM | POA: Diagnosis not present

## 2016-11-28 DIAGNOSIS — E1122 Type 2 diabetes mellitus with diabetic chronic kidney disease: Secondary | ICD-10-CM | POA: Diagnosis not present

## 2016-11-28 DIAGNOSIS — M109 Gout, unspecified: Secondary | ICD-10-CM | POA: Diagnosis not present

## 2016-11-28 DIAGNOSIS — I129 Hypertensive chronic kidney disease with stage 1 through stage 4 chronic kidney disease, or unspecified chronic kidney disease: Secondary | ICD-10-CM | POA: Diagnosis not present

## 2016-12-11 ENCOUNTER — Encounter (INDEPENDENT_AMBULATORY_CARE_PROVIDER_SITE_OTHER): Payer: Medicare Other | Admitting: Ophthalmology

## 2016-12-11 DIAGNOSIS — H43813 Vitreous degeneration, bilateral: Secondary | ICD-10-CM

## 2016-12-11 DIAGNOSIS — H35033 Hypertensive retinopathy, bilateral: Secondary | ICD-10-CM

## 2016-12-11 DIAGNOSIS — E11311 Type 2 diabetes mellitus with unspecified diabetic retinopathy with macular edema: Secondary | ICD-10-CM | POA: Diagnosis not present

## 2016-12-11 DIAGNOSIS — H2511 Age-related nuclear cataract, right eye: Secondary | ICD-10-CM | POA: Diagnosis not present

## 2016-12-11 DIAGNOSIS — E113313 Type 2 diabetes mellitus with moderate nonproliferative diabetic retinopathy with macular edema, bilateral: Secondary | ICD-10-CM | POA: Diagnosis not present

## 2016-12-11 DIAGNOSIS — I10 Essential (primary) hypertension: Secondary | ICD-10-CM | POA: Diagnosis not present

## 2017-01-01 DIAGNOSIS — H40013 Open angle with borderline findings, low risk, bilateral: Secondary | ICD-10-CM | POA: Diagnosis not present

## 2017-01-01 DIAGNOSIS — E113313 Type 2 diabetes mellitus with moderate nonproliferative diabetic retinopathy with macular edema, bilateral: Secondary | ICD-10-CM | POA: Diagnosis not present

## 2017-01-01 DIAGNOSIS — H40033 Anatomical narrow angle, bilateral: Secondary | ICD-10-CM | POA: Diagnosis not present

## 2017-01-01 DIAGNOSIS — H2513 Age-related nuclear cataract, bilateral: Secondary | ICD-10-CM | POA: Diagnosis not present

## 2017-01-01 DIAGNOSIS — Z961 Presence of intraocular lens: Secondary | ICD-10-CM | POA: Diagnosis not present

## 2017-01-16 ENCOUNTER — Ambulatory Visit (INDEPENDENT_AMBULATORY_CARE_PROVIDER_SITE_OTHER): Payer: Medicare Other | Admitting: Internal Medicine

## 2017-01-16 ENCOUNTER — Encounter: Payer: Self-pay | Admitting: Internal Medicine

## 2017-01-16 VITALS — BP 138/88 | HR 89 | Wt 251.0 lb

## 2017-01-16 DIAGNOSIS — E1165 Type 2 diabetes mellitus with hyperglycemia: Secondary | ICD-10-CM | POA: Diagnosis not present

## 2017-01-16 DIAGNOSIS — E1142 Type 2 diabetes mellitus with diabetic polyneuropathy: Secondary | ICD-10-CM

## 2017-01-16 DIAGNOSIS — Z794 Long term (current) use of insulin: Secondary | ICD-10-CM | POA: Diagnosis not present

## 2017-01-16 DIAGNOSIS — Z23 Encounter for immunization: Secondary | ICD-10-CM | POA: Diagnosis not present

## 2017-01-16 DIAGNOSIS — E785 Hyperlipidemia, unspecified: Secondary | ICD-10-CM | POA: Diagnosis not present

## 2017-01-16 DIAGNOSIS — IMO0002 Reserved for concepts with insufficient information to code with codable children: Secondary | ICD-10-CM

## 2017-01-16 LAB — POCT GLYCOSYLATED HEMOGLOBIN (HGB A1C): Hemoglobin A1C: 10.2

## 2017-01-16 MED ORDER — EMPAGLIFLOZIN 10 MG PO TABS: 10.0000 mg | ORAL_TABLET | Freq: Every day | ORAL | 5 refills | Status: DC

## 2017-01-16 MED ORDER — INSULIN NPH ISOPHANE & REGULAR (70-30) 100 UNIT/ML ~~LOC~~ SUSP: SUBCUTANEOUS | 11 refills | Status: DC

## 2017-01-16 NOTE — Progress Notes (Signed)
Patient ID: Aaron Mckenzie, male   DOB: [birth year], 58 y.o.   MRN: [id number]  HPI: Aaron Mckenzie is a 56 y.o.-year-old male, returning for f/u for DM2, dx 2004, insulin-dependent since 12/2012, uncontrolled, with complications (CKD, neuropathy, DR). Last visit 1.5 mo ago.   Last hemoglobin A1c was:  Lab Results  Component Value Date   HGBA1C 9.9 09/26/2016   HGBA1C 8.3 06/20/2016   HGBA1C 7.5 03/28/2016   Pt was on: U500 insulin: - 20 units before a smaller meal - 22 units before a larger meal. Sliding scale of insulin: - 150-175: + 1 unit  - 176-200: + 2 units  - 201-225: + 3 units  - > 226: + 4 units  We stopped Glipizide 5 mg bid when we started mealtime insulin We had to stop Metformin 2/2 decreased kidney fxn.  He was then on: N and R insulin (ReliOn): Insulin Before brunch Before dinner  Regular 50 40  NPH 60  50    Currently, he is on: - ReliOn 70/30  - 40 units before b'fast - 50 units before dinner  Pt checks sugars twice a day (his only 2 meals a day): - am: 164-231 >> 136, 150-225, 262 >> 168-199, 205, 209 >> 160-240, 279 (higher if he snacks watching TV during the night) - before lunch: 131, 170 >> n/c >> 223 >> n/c - 2h after lunch: 400, 536 >> n/c  >> 197 >> n/c - before dinner: 122-163, 191, 252 >> 120-160, 269, 327 >> 120-144 >> 120-155, 199, 243 - 2h after dinner: 300s >> n/c  - bedtime: 54-118 >> 53, 68, 120, 154 >> 180-190 >> n/c Lowest: 122 >> 100 >> 120 >> 120 Highest: 400s - ate cereals and did not take insulin >> 209 >> 484  Pt's meals are: - Breakfast: eggs, meat, grits, wheat toast - Lunch: may skip or eat late b'fast  - Dinner: chicken/fish/pork chops, 2 sides, salad, baked potatoes - Snacks: 2x a day - apple slices, popcorn  -+ CKD, last BUN/creatinine:  11/29/2016: BUN/Cr 17/1.31, GFR 60, Glu 484 Lab Results  Component Value Date   BUN 28 (H) 02/15/2016   CREATININE 1.49 02/15/2016  + Proteinuria: 11/29/2016: 12,806 mg/g  On  lisinopril -HL; last set of lipids: Lab Results  Component Value Date   CHOL 121 02/15/2016   HDL 22.20 (L) 02/15/2016   LDLDIRECT 55.0 02/15/2016   TRIG 301.0 (H) 02/15/2016   CHOLHDL 5 02/15/2016  On pravastatin On ASA 81. - last eye exam was in 10/2016: + DR. He saw Dr. Katy Fitch >> had cataract sx 08/2016 - She does have numbness and tingling in his feet and hands. On neurontin 600-600-900 mg.  He also has a history of HTN, HL, pseudogout, bilateral carpal tunnel s/p sx in each hand: 1985 and 2000.  ROS: Constitutional: no weight gain/no weight loss, no fatigue, no subjective hyperthermia, no subjective hypothermia Eyes: no blurry vision, no xerophthalmia ENT: no sore throat, no nodules palpated in throat, no dysphagia, no odynophagia, no hoarseness Cardiovascular: no CP/no SOB/no palpitations/no leg swelling Respiratory: no cough/no SOB/no wheezing Gastrointestinal: no N/no V/no D/no C/no acid reflux Musculoskeletal: no muscle aches/no joint aches Skin: no rashes, no hair loss Neurological: no tremors/+ numbness/+ tingling/no dizziness  I reviewed pt's medications, allergies, PMH, social hx, family hx, and changes were documented in the history of present illness. Otherwise, unchanged from my initial visit note.  PE: BP 138/88 (BP Location: Left Arm, Patient Position: Sitting)  Pulse 89   Wt 251 lb (113.9 kg)   SpO2 98%   BMI 37.07 kg/m  Body mass index is 37.07 kg/m. Wt Readings from Last 3 Encounters:  01/16/17 251 lb (113.9 kg)  11/27/16 247 lb (112 kg)  09/26/16 247 lb (112 kg)   Constitutional: overweight, in NAD Eyes: PERRLA, EOMI, no exophthalmos ENT: moist mucous membranes, no thyromegaly, no cervical lymphadenopathy Cardiovascular: RRR, No MRG, + bilateral pitting edema Respiratory: CTA B Gastrointestinal: abdomen soft, NT, ND, BS+ Musculoskeletal: no deformities, strength intact in all 4 Skin: moist, warm, no rashes Neurological: no tremor with  outstretched hands, DTR normal in all 4  ASSESSMENT: 1. DM2, insulin-dependent, uncontrolled, with complications - increased insulin resistance - PN  -stable - CKD  2. HL  PLAN:  1. Patient with long-standing, uncontrolled DM, with very high insulin resistance, prev. On U500, which then stopped being covered by the insurance >> his DM control continues to worse. At last visit, we increased his 70/30 insulin, but sugars are worse now. Will try to increase them further and also add Jardiance, hopefully covered by his insurance - we discussed that we may need to stop Lasix if he starts Jardiance - I advised him to:  Patient Instructions  Please increase insulin doses: - 50 units before b'fast - 60 units before dinner  Try to start: - Jardiance 10 mg before b'fast  If you feel dizzy when you stand up or dehydrated, we may need to stop Lasix.  Please return in 3 months with your sugar log.  - today, HbA1c is 10.2% (higher) - continue checking sugars at different times of the day - check 2-3x a day, rotating checks - advised for yearly eye exams >> he is UTD - will give him the flu shot today - Return to clinic in 3 mo with sugar log   2. HL - reviewed latest panel >> LDL at goal but high TG - Continue pravastatin >> no side effects  Philemon Kingdom, MD PhD [facility] Endocrinology

## 2017-01-16 NOTE — Patient Instructions (Addendum)
Please increase insulin doses: - 50 units before b'fast - 60 units before dinner  Try to start: - Jardiance 10 mg before b'fast  If you feel dizzy when you stand up or dehydrated, we may need to stop Lasix.  Please return in 3 months with your sugar log.

## 2017-01-17 ENCOUNTER — Other Ambulatory Visit: Payer: Self-pay | Admitting: Internal Medicine

## 2017-01-22 ENCOUNTER — Encounter (INDEPENDENT_AMBULATORY_CARE_PROVIDER_SITE_OTHER): Payer: Medicare Other | Admitting: Ophthalmology

## 2017-01-24 DIAGNOSIS — L851 Acquired keratosis [keratoderma] palmaris et plantaris: Secondary | ICD-10-CM | POA: Diagnosis not present

## 2017-01-24 DIAGNOSIS — L97521 Non-pressure chronic ulcer of other part of left foot limited to breakdown of skin: Secondary | ICD-10-CM | POA: Diagnosis not present

## 2017-01-24 DIAGNOSIS — Z794 Long term (current) use of insulin: Secondary | ICD-10-CM | POA: Diagnosis not present

## 2017-01-24 DIAGNOSIS — E1140 Type 2 diabetes mellitus with diabetic neuropathy, unspecified: Secondary | ICD-10-CM | POA: Diagnosis not present

## 2017-01-24 DIAGNOSIS — B351 Tinea unguium: Secondary | ICD-10-CM | POA: Diagnosis not present

## 2017-02-05 ENCOUNTER — Encounter (INDEPENDENT_AMBULATORY_CARE_PROVIDER_SITE_OTHER): Payer: Medicare Other | Admitting: Ophthalmology

## 2017-02-07 ENCOUNTER — Encounter: Payer: Self-pay | Admitting: Internal Medicine

## 2017-02-07 ENCOUNTER — Ambulatory Visit (INDEPENDENT_AMBULATORY_CARE_PROVIDER_SITE_OTHER): Payer: Medicare Other | Admitting: Internal Medicine

## 2017-02-07 VITALS — BP 136/74 | HR 94 | Temp 98.3°F | Wt 246.8 lb

## 2017-02-07 DIAGNOSIS — J069 Acute upper respiratory infection, unspecified: Secondary | ICD-10-CM | POA: Insufficient documentation

## 2017-02-07 NOTE — Progress Notes (Signed)
Subjective:    Patient ID: Aaron Mckenzie, male    DOB: 07-12-1960, 56 y.o.   MRN: [id number]  HPI Here due to respiratory symptoms  Having cough with sputum Started about a week ago and progressively worsening Some sore throat Some nasal congestion Wife also sick  No fever No chills or sweats No SOB Some right ear stuffiness--thinks it is wax  Tried OTC naproxen  Night cough medication--not much help  Current Outpatient Medications on File Prior to Visit  Medication Sig Dispense Refill  . aspirin EC 81 MG tablet Take 81 mg by mouth daily.    Marland Kitchen BAYER CONTOUR NEXT TEST test strip USE TO TEST BLOOD SUGAR 2-4 TIMES DAILY 300 each 5  . brimonidine (ALPHAGAN P) 0.1 % SOLN     . colchicine (COLCRYS) 0.6 MG tablet TAKE 1 TABLET(0.6 MG) BY MOUTH TWICE DAILY 60 tablet 11  . dorzolamide-timolol (COSOPT) 22.3-6.8 MG/ML ophthalmic solution     . empagliflozin (JARDIANCE) 10 MG TABS tablet Take 10 mg by mouth daily. 30 tablet 5  . furosemide (LASIX) 20 MG tablet TK 1 T PO QD  1  . gabapentin (NEURONTIN) 300 MG capsule Take 3 capsules (900 mg total) by mouth 3 (three) times daily. 810 capsule 2  . insulin NPH-regular Human (NOVOLIN 70/30) (70-30) 100 UNIT/ML injection Inject under skin 50 units in am and 60 units at bedtime 30 mL 11  . INSULIN SYRINGE .5CC/29G 29G X 1/2" 0.5 ML MISC USE TWICE DAILY 100 each 0  . INSULIN SYRINGE .5CC/29G 29G X 1/2" 0.5 ML MISC USE TWICE DAILY 100 each 0  . lisinopril (PRINIVIL,ZESTRIL) 20 MG tablet TAKE ONE TABLET BY MOUTH EVERY DAY 90 tablet 3  . pravastatin (PRAVACHOL) 40 MG tablet TAKE 1 TABLET(40 MG) BY MOUTH DAILY 90 tablet 0  . timolol (TIMOPTIC) 0.5 % ophthalmic solution     . urea (CARMOL) 10 % cream APPLY TOPICALLY DAILY AS NEEDED FOR FEET 85 g 5   No current facility-administered medications on file prior to visit.     No Known Allergies  Past Medical History:  Diagnosis Date  . Diabetes mellitus   . Ectodermal dysplasia   .  Hyperlipidemia   . Hypertension   . Pseudogout   . Type 2 diabetes mellitus with neurological manifestations, uncontrolled ([place]) 04/23/2008   Qualifier: Diagnosis of  By: Silvio Pate MD, [facility]     Past Surgical History:  Procedure Laterality Date  . APPENDECTOMY    . RIB FRACTURE SURGERY  1988   right    Family History  Problem Relation Age of Onset  . Arthritis Mother   . Arthritis Father     Social History   Socioeconomic History  . Marital status: Married    Spouse name: Not on file  . Number of children: Not on file  . Years of education: Not on file  . Highest education level: Not on file  Social Needs  . Financial resource strain: Not on file  . Food insecurity - worry: Not on file  . Food insecurity - inability: Not on file  . Transportation needs - medical: Not on file  . Transportation needs - non-medical: Not on file  Occupational History  . Occupation: Retired after disability    Comment: [place] at [employer]  . Smoking status: Never Smoker  . Smokeless tobacco: Never Used  Substance and Sexual Activity  . Alcohol use: No  . Drug use: No  .  Sexual activity: Not on file  Other Topics Concern  . Not on file  Social History Narrative   No living will or health care POA   Would want wife to make medical decisions for him   Would accept CPR but no prolonged ventilation   Not sure about tube feeds   Review of Systems No headache No rash No vomiting or diarrhea Appetite is okay    Objective:   Physical Exam  Constitutional: He appears well-developed. No distress.  HENT:  Mouth/Throat: Oropharynx is clear and moist. No oropharyngeal exudate.  No sinus tenderness TMs normal Mild nasal inflammation   Neck: Normal range of motion.  Pulmonary/Chest: Effort normal and breath sounds normal. No respiratory distress. He has no wheezes. He has no rales.  Lymphadenopathy:    He has no cervical adenopathy.          Assessment &  Plan:

## 2017-02-07 NOTE — Assessment & Plan Note (Signed)
Still seems to be only self limited viral infection Discussed supportive care If worsens in the next few days, would try empiric amoxil

## 2017-02-08 ENCOUNTER — Encounter (INDEPENDENT_AMBULATORY_CARE_PROVIDER_SITE_OTHER): Payer: Medicare Other | Admitting: Ophthalmology

## 2017-02-08 DIAGNOSIS — E11311 Type 2 diabetes mellitus with unspecified diabetic retinopathy with macular edema: Secondary | ICD-10-CM

## 2017-02-08 DIAGNOSIS — H43813 Vitreous degeneration, bilateral: Secondary | ICD-10-CM | POA: Diagnosis not present

## 2017-02-08 DIAGNOSIS — H35033 Hypertensive retinopathy, bilateral: Secondary | ICD-10-CM | POA: Diagnosis not present

## 2017-02-08 DIAGNOSIS — I10 Essential (primary) hypertension: Secondary | ICD-10-CM

## 2017-02-08 DIAGNOSIS — E113313 Type 2 diabetes mellitus with moderate nonproliferative diabetic retinopathy with macular edema, bilateral: Secondary | ICD-10-CM

## 2017-02-08 LAB — HM DIABETES EYE EXAM

## 2017-03-05 ENCOUNTER — Encounter: Payer: Self-pay | Admitting: Internal Medicine

## 2017-03-05 ENCOUNTER — Ambulatory Visit (INDEPENDENT_AMBULATORY_CARE_PROVIDER_SITE_OTHER): Payer: Medicare Other | Admitting: Internal Medicine

## 2017-03-05 VITALS — BP 110/84 | HR 90 | Temp 98.3°F | Ht 68.0 in | Wt 243.0 lb

## 2017-03-05 DIAGNOSIS — Z794 Long term (current) use of insulin: Secondary | ICD-10-CM

## 2017-03-05 DIAGNOSIS — I10 Essential (primary) hypertension: Secondary | ICD-10-CM | POA: Diagnosis not present

## 2017-03-05 DIAGNOSIS — Z7189 Other specified counseling: Secondary | ICD-10-CM

## 2017-03-05 DIAGNOSIS — N183 Chronic kidney disease, stage 3 unspecified: Secondary | ICD-10-CM

## 2017-03-05 DIAGNOSIS — Z1211 Encounter for screening for malignant neoplasm of colon: Secondary | ICD-10-CM

## 2017-03-05 DIAGNOSIS — Z0000 Encounter for general adult medical examination without abnormal findings: Secondary | ICD-10-CM

## 2017-03-05 DIAGNOSIS — E1165 Type 2 diabetes mellitus with hyperglycemia: Secondary | ICD-10-CM | POA: Diagnosis not present

## 2017-03-05 DIAGNOSIS — IMO0002 Reserved for concepts with insufficient information to code with codable children: Secondary | ICD-10-CM

## 2017-03-05 DIAGNOSIS — E1142 Type 2 diabetes mellitus with diabetic polyneuropathy: Secondary | ICD-10-CM

## 2017-03-05 DIAGNOSIS — N400 Enlarged prostate without lower urinary tract symptoms: Secondary | ICD-10-CM

## 2017-03-05 LAB — COMPREHENSIVE METABOLIC PANEL
ALBUMIN: 3.6 g/dL (ref 3.5–5.2)
ALK PHOS: 98 U/L (ref 39–117)
ALT: 12 U/L (ref 0–53)
AST: 15 U/L (ref 0–37)
BUN: 23 mg/dL (ref 6–23)
CHLORIDE: 95 meq/L — AB (ref 96–112)
CO2: 27 mEq/L (ref 19–32)
CREATININE: 1.57 mg/dL — AB (ref 0.40–1.50)
Calcium: 9.3 mg/dL (ref 8.4–10.5)
GFR: 48.3 mL/min — ABNORMAL LOW (ref >60.00)
Glucose, Bld: 390 mg/dL — ABNORMAL HIGH (ref 70–99)
POTASSIUM: 4.3 meq/L (ref 3.5–5.1)
Sodium: 131 mEq/L — ABNORMAL LOW (ref 135–145)
Total Bilirubin: 0.5 mg/dL (ref 0.2–1.2)
Total Protein: 6.5 g/dL (ref 6.0–8.3)

## 2017-03-05 LAB — CBC
HEMATOCRIT: 43.1 % (ref 39.0–52.0)
Hemoglobin: 14.5 g/dL (ref 13.0–17.0)
MCHC: 33.7 g/dL (ref 30.0–36.0)
MCV: 84.7 fl (ref 78.0–100.0)
Platelets: 202 10*3/uL (ref 150.0–400.0)
RBC: 5.09 Mil/uL (ref 4.22–5.81)
RDW: 14.1 % (ref 11.5–15.5)
WBC: 9.2 10*3/uL (ref 4.0–10.5)

## 2017-03-05 LAB — LIPID PANEL
Cholesterol: 175 mg/dL (ref 0–200)
HDL: 27.5 mg/dL — ABNORMAL LOW (ref >39.00)
Total CHOL/HDL Ratio: 6
Triglycerides: 577 mg/dL — ABNORMAL HIGH (ref 0.0–149.0)

## 2017-03-05 LAB — PSA: PSA: 2.97 ng/mL (ref 0.10–4.00)

## 2017-03-05 LAB — HM DIABETES FOOT EXAM

## 2017-03-05 NOTE — Assessment & Plan Note (Signed)
See social history 

## 2017-03-05 NOTE — Progress Notes (Signed)
Hearing Screening   Method: Audiometry   125Hz  250Hz  500Hz  1000Hz  2000Hz  3000Hz  4000Hz  6000Hz  8000Hz   Right ear:   20 20 20  20     Left ear:   20 20 20  20     Vision Screening Comments: Dr Zigmund Daniel December 2018

## 2017-03-05 NOTE — Assessment & Plan Note (Signed)
Ongoing poor control Working with Dr Conni Slipper taking the jardiance yet Neuropathy controlled with the gabapentin Recent eye exam--will try to get records from Dr Zigmund Daniel Asked him to discuss diabetic shoes with Dr Cleda Mccreedy

## 2017-03-05 NOTE — Assessment & Plan Note (Signed)
Mild nocturia only No Rx needed

## 2017-03-05 NOTE — Assessment & Plan Note (Signed)
BP Readings from Last 3 Encounters:  03/05/17 110/84  02/07/17 136/74  01/16/17 138/88   Good control

## 2017-03-05 NOTE — Progress Notes (Signed)
Subjective:    Patient ID: Aaron Mckenzie, male    DOB: 11-Mar-1960, 57 y.o.   MRN: [id number]  HPI Here for Medicare wellness and follow up of chronic health conditions Reviewed form and advanced directives Reviewed other doctors No alcohol or tobacco Doesn't exercise--discussed. Only yard/housework Vision and hearing are good. No falls Mild mood issues around the holidays---family all gone. No anhedonia Independent with instrumental ADLs No sig memory problems  Some cough lately Some drainage in throat Goes back 3-4 weeks "nothing major" Wife and he have been having low level symptoms Doesn't feel sick  Continues with Dr Cruzita Lederer Had to change from U500 insulin-- now on 70/30 Wasn't able to start jardiance-- also concerned about side effects Has started new health plan---needs to recheck coverage Continues on gabapentin--keeps the neuropathy controlled (but using 10 per day)  Still sees Dr Candiss Norse twice a year Stable function on ACEI Known proteinuria  No recent gout attacks Uses colchicine prn only  No chest pain No SOB No dizziness or syncope Chronic foot edema--stable for years No palpitations other than when active (just feels it faster)  Current Outpatient Medications on File Prior to Visit  Medication Sig Dispense Refill  . aspirin EC 81 MG tablet Take 81 mg by mouth daily.    Marland Kitchen BAYER CONTOUR NEXT TEST test strip USE TO TEST BLOOD SUGAR 2-4 TIMES DAILY 300 each 5  . brimonidine (ALPHAGAN P) 0.1 % SOLN     . colchicine (COLCRYS) 0.6 MG tablet TAKE 1 TABLET(0.6 MG) BY MOUTH TWICE DAILY 60 tablet 11  . dorzolamide-timolol (COSOPT) 22.3-6.8 MG/ML ophthalmic solution     . empagliflozin (JARDIANCE) 10 MG TABS tablet Take 10 mg by mouth daily. 30 tablet 5  . furosemide (LASIX) 20 MG tablet TK 1 T PO QD  1  . gabapentin (NEURONTIN) 300 MG capsule Take 3 capsules (900 mg total) by mouth 3 (three) times daily. 810 capsule 2  . insulin NPH-regular Human (NOVOLIN  70/30) (70-30) 100 UNIT/ML injection Inject under skin 50 units in am and 60 units at bedtime 30 mL 11  . INSULIN SYRINGE .5CC/29G 29G X 1/2" 0.5 ML MISC USE TWICE DAILY 100 each 0  . INSULIN SYRINGE .5CC/29G 29G X 1/2" 0.5 ML MISC USE TWICE DAILY 100 each 0  . lisinopril (PRINIVIL,ZESTRIL) 20 MG tablet TAKE ONE TABLET BY MOUTH EVERY DAY 90 tablet 3  . pravastatin (PRAVACHOL) 40 MG tablet TAKE 1 TABLET(40 MG) BY MOUTH DAILY 90 tablet 0  . timolol (TIMOPTIC) 0.5 % ophthalmic solution     . urea (CARMOL) 10 % cream APPLY TOPICALLY DAILY AS NEEDED FOR FEET 85 g 5   No current facility-administered medications on file prior to visit.     No Known Allergies  Past Medical History:  Diagnosis Date  . Diabetes mellitus   . Ectodermal dysplasia   . Hyperlipidemia   . Hypertension   . Pseudogout   . Type 2 diabetes mellitus with neurological manifestations, uncontrolled ([place]) 04/23/2008   Qualifier: Diagnosis of  By: Silvio Pate MD, [facility]     Past Surgical History:  Procedure Laterality Date  . APPENDECTOMY    . CATARACT EXTRACTION Left 07/2016  . RIB FRACTURE SURGERY  1988   right    Family History  Problem Relation Age of Onset  . Arthritis Mother   . Arthritis Father     Social History   Socioeconomic History  . Marital status: Married    Spouse name: Not  on file  . Number of children: Not on file  . Years of education: Not on file  . Highest education level: Not on file  Social Needs  . Financial resource strain: Not on file  . Food insecurity - worry: Not on file  . Food insecurity - inability: Not on file  . Transportation needs - medical: Not on file  . Transportation needs - non-medical: Not on file  Occupational History  . Occupation: Retired after disability    Comment: [place] at [employer]  . Smoking status: Never Smoker  . Smokeless tobacco: Never Used  Substance and Sexual Activity  . Alcohol use: No  . Drug use: No  . Sexual  activity: Not on file  Other Topics Concern  . Not on file  Social History Narrative   No living will or health care POA   Would want wife to make medical decisions for him   Would accept CPR but no prolonged ventilation   Not sure about tube feeds   Review of Systems Appetite is good Wears seat belt Teeth okay---full dentures No skin problems--hasn't seen dermatologist No headaches Sleeps okay--- nocturia every 4 hours or so. This has become more prominent  Voids okay during the day Bowels are fine. No blood. No sig back or joint pains (when gout quiet) Occasional heartburn (depending on diet)--no meds. No dysphagia but careful about what he eats.    Objective:   Physical Exam  Constitutional: He is oriented to person, place, and time. He appears well-developed. No distress.  HENT:  Mouth/Throat: Oropharynx is clear and moist. No oropharyngeal exudate.  Full upper denture only  Neck: No thyromegaly present.  Cardiovascular: Normal rate, regular rhythm, normal heart sounds and intact distal pulses. Exam reveals no gallop.  No murmur heard. Pulmonary/Chest: Effort normal and breath sounds normal. No respiratory distress. He has no wheezes. He has no rales.  Abdominal: Soft. He exhibits no distension. There is no tenderness. There is no rebound and no guarding.  Musculoskeletal:  2+ edema feet and calves  Lymphadenopathy:    He has no cervical adenopathy.  Neurological: He is alert and oriented to person, place, and time.  President-- "Megan Salon, Obama, Bush" [phone] [id number] Recall 3/3  Decreased sensation in feet  Skin:  Scaling over feet. Scab over left great toe--from Rx  Psychiatric: He has a normal mood and affect. His behavior is normal.          Assessment & Plan:

## 2017-03-05 NOTE — Assessment & Plan Note (Signed)
On ACEI Continues with nephrologist

## 2017-03-05 NOTE — Assessment & Plan Note (Signed)
I have personally reviewed the Medicare Annual Wellness questionnaire and have noted 1. The patient's medical and social history 2. Their use of alcohol, tobacco or illicit drugs 3. Their current medications and supplements 4. The patient's functional ability including ADL's, fall risks, home safety risks and hearing or visual             impairment. 5. Diet and physical activities 6. Evidence for depression or mood disorders  The patients weight, height, BMI and visual acuity have been recorded in the chart I have made referrals, counseling and provided education to the patient based review of the above and I have provided the pt with a written personalized care plan for preventive services.  I have provided you with a copy of your personalized plan for preventive services. Please take the time to review along with your updated medication list.  FIT Will check PSA after discussion Discussed shingrix--he will consider (may defer for a while) Discussed increasing exercise

## 2017-03-06 LAB — LDL CHOLESTEROL, DIRECT: LDL DIRECT: 76 mg/dL

## 2017-03-07 ENCOUNTER — Telehealth: Payer: Self-pay | Admitting: Internal Medicine

## 2017-03-07 NOTE — Telephone Encounter (Signed)
Please find out why he needs the handicapped placard. Based on our recent visit, I don't think he qualifies for any of the categories that would allow it

## 2017-03-07 NOTE — Telephone Encounter (Signed)
Pt dropped medicare wellness forms and disability parking placard In dr Silvio Pate in box

## 2017-03-07 NOTE — Telephone Encounter (Signed)
Line was busy

## 2017-03-12 NOTE — Telephone Encounter (Signed)
Spoke to pt. He said it is hard for him to stand for a long time or walk a long way.

## 2017-03-12 NOTE — Telephone Encounter (Signed)
Okay--form done

## 2017-03-12 NOTE — Telephone Encounter (Signed)
Patient notified form is ready for pick up. °

## 2017-03-13 ENCOUNTER — Other Ambulatory Visit (INDEPENDENT_AMBULATORY_CARE_PROVIDER_SITE_OTHER): Payer: Medicare Other

## 2017-03-13 DIAGNOSIS — Z1211 Encounter for screening for malignant neoplasm of colon: Secondary | ICD-10-CM | POA: Diagnosis not present

## 2017-03-13 LAB — FECAL OCCULT BLOOD, IMMUNOCHEMICAL: FECAL OCCULT BLD: NEGATIVE

## 2017-03-26 ENCOUNTER — Encounter (INDEPENDENT_AMBULATORY_CARE_PROVIDER_SITE_OTHER): Payer: Medicare Other | Admitting: Ophthalmology

## 2017-03-28 ENCOUNTER — Encounter: Payer: Self-pay | Admitting: Internal Medicine

## 2017-04-02 ENCOUNTER — Encounter (INDEPENDENT_AMBULATORY_CARE_PROVIDER_SITE_OTHER): Payer: Medicare Other | Admitting: Ophthalmology

## 2017-04-02 DIAGNOSIS — E113313 Type 2 diabetes mellitus with moderate nonproliferative diabetic retinopathy with macular edema, bilateral: Secondary | ICD-10-CM

## 2017-04-02 DIAGNOSIS — E11311 Type 2 diabetes mellitus with unspecified diabetic retinopathy with macular edema: Secondary | ICD-10-CM | POA: Diagnosis not present

## 2017-04-02 DIAGNOSIS — H43813 Vitreous degeneration, bilateral: Secondary | ICD-10-CM | POA: Diagnosis not present

## 2017-04-02 DIAGNOSIS — I10 Essential (primary) hypertension: Secondary | ICD-10-CM | POA: Diagnosis not present

## 2017-04-02 DIAGNOSIS — H35033 Hypertensive retinopathy, bilateral: Secondary | ICD-10-CM

## 2017-04-03 ENCOUNTER — Encounter (INDEPENDENT_AMBULATORY_CARE_PROVIDER_SITE_OTHER): Payer: Medicare Other | Admitting: Ophthalmology

## 2017-04-04 ENCOUNTER — Other Ambulatory Visit: Payer: Self-pay | Admitting: Internal Medicine

## 2017-04-19 ENCOUNTER — Encounter: Payer: Self-pay | Admitting: Internal Medicine

## 2017-04-19 ENCOUNTER — Ambulatory Visit (INDEPENDENT_AMBULATORY_CARE_PROVIDER_SITE_OTHER): Payer: Medicare Other | Admitting: Internal Medicine

## 2017-04-19 VITALS — BP 148/86 | HR 88 | Temp 98.4°F | Wt 244.0 lb

## 2017-04-19 DIAGNOSIS — E785 Hyperlipidemia, unspecified: Secondary | ICD-10-CM

## 2017-04-19 DIAGNOSIS — E1165 Type 2 diabetes mellitus with hyperglycemia: Secondary | ICD-10-CM

## 2017-04-19 DIAGNOSIS — E1142 Type 2 diabetes mellitus with diabetic polyneuropathy: Secondary | ICD-10-CM | POA: Diagnosis not present

## 2017-04-19 DIAGNOSIS — Z794 Long term (current) use of insulin: Secondary | ICD-10-CM | POA: Diagnosis not present

## 2017-04-19 DIAGNOSIS — IMO0002 Reserved for concepts with insufficient information to code with codable children: Secondary | ICD-10-CM

## 2017-04-19 LAB — POCT GLYCOSYLATED HEMOGLOBIN (HGB A1C): HEMOGLOBIN A1C: 11.5

## 2017-04-19 MED ORDER — EMPAGLIFLOZIN 10 MG PO TABS: 10.0000 mg | ORAL_TABLET | Freq: Every day | ORAL | 5 refills | Status: DC

## 2017-04-19 MED ORDER — INSULIN NPH ISOPHANE & REGULAR (70-30) 100 UNIT/ML ~~LOC~~ SUSP: SUBCUTANEOUS | 11 refills | Status: DC

## 2017-04-19 NOTE — Patient Instructions (Signed)
Please continue 70/30 insulin: - 50 units before b'fast  Please increase: - 70 units before dinner  Please start: - Jardiance 10 mg before b'fast  Please reduce the fat in the diet, as we discussed.  Reduce snacks at night >> snack on fruit if needed.  Please stop at the lab.  Please return in 3 months with your sugar log.

## 2017-04-19 NOTE — Addendum Note (Signed)
Addended by: Drucilla Schmidt on: 04/19/2017 02:39 PM   Modules accepted: Orders

## 2017-04-19 NOTE — Progress Notes (Addendum)
Patient ID: Aaron Mckenzie, male   DOB: [birth year], 59 y.o.   MRN: [id number]  HPI: Aaron Mckenzie is a 57 y.o.-year-old male, returning for f/u for DM2, dx 2004, insulin-dependent since 12/2012, uncontrolled, with complications (CKD, neuropathy, DR). Last visit 3 months ago.  Last hemoglobin A1c was:  01/16/2017: 10.2% Lab Results  Component Value Date   HGBA1C 9.9 09/26/2016   HGBA1C 8.3 06/20/2016   HGBA1C 7.5 03/28/2016   Pt was on: U500 insulin: - 20 units before a smaller meal - 22 units before a larger meal. Sliding scale of insulin: - 150-175: + 1 unit  - 176-200: + 2 units  - 201-225: + 3 units  - > 226: + 4 units  We stopped Glipizide 5 mg bid when we started mealtime insulin We had to stop Metformin 2/2 decreased kidney fxn.  He was then on: N and R insulin (ReliOn): Insulin Before brunch Before dinner  Regular 50 40  NPH 60  50    Currently, he is on: - Novolin 70/30: - 50 units before b'fast - 60 units before dinner -  Not started for few   Pt checks sugars 2x a day (eats 2 meals a day but snacks after dinner) - am:  168-199, 205, 209 >> 160-240, 279  >> 170-218 - before lunch: 131, 170 >> n/c >> 223 >> n/c - 2h after lunch: 400, 536 >> n/c  >> 197 >> n/c - before dinner: 120-144 >> 120-155, 199, 243 >> 107-122, 138 - 2h after dinner: 300s >> n/c  - bedtime: 53, 68, 120, 154 >> 180-190 >> n/c Lowest: 120 >>  107. Highest: 484 >> 218.  Pt's meals are: - Breakfast: eggs, meat, grits, wheat toast - Lunch: may skip or eat late b'fast  - Dinner: chicken/fish/pork chops, 2 sides, salad, baked potatoes - Snacks: 2x a day - apple slices, popcorn  - + CKD, last BUN/creatinine:  Lab Results  Component Value Date   BUN 23 03/05/2017   CREATININE 1.57 (H) 03/05/2017  11/29/2016: BUN/Cr 17/1.31, GFR 60, Glu 484  + Proteinuria: 11/29/2016: 12,806 mg/g  On lisinopril.  - + HL; last 3 sets of lipids: Lab Results  Component Value Date   CHOL 175  03/05/2017   CHOL 121 02/15/2016   CHOL 201 (H) 01/29/2015   Lab Results  Component Value Date   HDL 27.50 (L) 03/05/2017   HDL 22.20 (L) 02/15/2016   HDL 24.30 (L) 01/29/2015   No results found for: [facility] Lab Results  Component Value Date   TRIG (H) 03/05/2017    577.0 Triglyceride is over 400; calculations on Lipids are invalid.   TRIG 301.0 (H) 02/15/2016   TRIG 327.0 (H) 01/29/2015   Lab Results  Component Value Date   CHOLHDL 6 03/05/2017   CHOLHDL 5 02/15/2016   CHOLHDL 8 01/29/2015   Lab Results  Component Value Date   LDLDIRECT 76.0 03/05/2017   LDLDIRECT 55.0 02/15/2016   LDLDIRECT 122.0 01/29/2015   On pravastatin.   On ASA 81.  - last eye exam was in 10/2016: + DR. He saw Dr. Katy Fitch >> had cataract sx 08/2016  - + numbness and tingling in his feet and hands. On neurontin 600-600-900 mg  He also has a history of HTN, HL, pseudogout, bilateral carpal tunnel s/p sx in each hand: 1985 and 2000.  ROS: Constitutional: no weight gain/no weight loss, no fatigue, no subjective hyperthermia, no subjective hypothermia Eyes: no blurry vision, no xerophthalmia ENT:  no sore throat, no nodules palpated in throat, no dysphagia, no odynophagia, no hoarseness Cardiovascular: no CP/no SOB/no palpitations/no leg swelling Respiratory: no cough/no SOB/no wheezing Gastrointestinal: no N/no V/no D/no C/no acid reflux Musculoskeletal: no muscle aches/no joint aches Skin: no rashes, no hair loss Neurological: no tremors/+ numbness/+ tingling/no dizziness  I reviewed pt's medications, allergies, PMH, social hx, family hx, and changes were documented in the history of present illness. Otherwise, unchanged from my initial visit note.  PE: BP (!) 148/86 (BP Location: Left Arm, Patient Position: Sitting, Cuff Size: Normal)   Pulse 88   Temp 98.4 F (36.9 C) (Oral)   Wt 244 lb (110.7 kg)   SpO2 98%   BMI 37.10 kg/m  Body mass index is 37.1 kg/m. Wt Readings from Last 3  Encounters:  04/19/17 244 lb (110.7 kg)  03/05/17 243 lb (110.2 kg)  02/07/17 246 lb 12 oz (111.9 kg)   Constitutional: Obese, in NAD Eyes: PERRLA, EOMI, no exophthalmos ENT: moist mucous membranes, no thyromegaly, no cervical lymphadenopathy Cardiovascular: RRR, No MRG, + bilateral pitting edema Respiratory: CTA B Gastrointestinal: abdomen soft, NT, ND, BS+ Musculoskeletal: no deformities, strength intact in all 4 Skin: moist, warm, no rashes Neurological: no tremor with outstretched hands, DTR normal in all 4  ASSESSMENT: 1. DM2, insulin-dependent, uncontrolled, with complications - increased insulin resistance - PN  -stable - CKD  2. HL  PLAN:  1. Patient with long-standing, uncontrolled, diabetes, with  high insulin resistance, previously on  U500 insulin, which unfortunately stopped being covered by his insurance.  His diabetes control worsened afterwards, therefore, we had to increase his 70/30 insulin and, at last visit, we added Jardiance.  I advised him to let me know if he develops orthostasis so that we can stop his Lasix. - he did not start 2/2 fear of SEs >> we addressed these today >> he agrees to start - his sugars are high in am and at goal before dinner >> will also increase the premixed insulin at night - we again discussed about diet >> advised to reduce fat and cholesterol to improve both DM and HL (he had very high TGs)  - I advised him to:  Patient Instructions  Please continue 70/30 insulin: - 50 units before b'fast  Please increase: - 70 units before dinner  Please start: - Jardiance 10 mg before b'fast  Please reduce the fat in the diet, as we discussed.  Reduce snacks at night >> snack on fruit if needed.  Please stop at the lab.  Please return in 3 months with your sugar log.  - today, HbA1c is 11.5% (higher), but does not correlate with thesugar in log >> will check a fructosamine - continue checking sugars at different times of the day -  check 2x a day, rotating checks - advised for yearly eye exams >> he is UTD  - Return to clinic in 3 mo with sugar log   2. HL -  reviewed latest lipid panel from 02/2017: LDL at goal, but even higher triglycerides >> discussed stopping eggs + bacon/sausage in am - Continues pravastatin without side effects  Office Visit on 04/19/2017  Component Date Value Ref Range Status  . Hemoglobin A1C 04/19/2017 11.5   Final  . Fructosamine 04/19/2017 363* 190 - 270 umol/L Final  . Hemoglobin A1C 01/16/2017 10.2   Final   The HbA1c calculated from fructosamine is much better: 7.8%!  Philemon Kingdom, MD PhD [facility] Endocrinology

## 2017-04-23 LAB — FRUCTOSAMINE: FRUCTOSAMINE: 363 umol/L — AB (ref 190–270)

## 2017-04-26 DIAGNOSIS — B351 Tinea unguium: Secondary | ICD-10-CM | POA: Diagnosis not present

## 2017-04-26 DIAGNOSIS — L97521 Non-pressure chronic ulcer of other part of left foot limited to breakdown of skin: Secondary | ICD-10-CM | POA: Diagnosis not present

## 2017-04-26 DIAGNOSIS — Z794 Long term (current) use of insulin: Secondary | ICD-10-CM | POA: Diagnosis not present

## 2017-04-26 DIAGNOSIS — E1140 Type 2 diabetes mellitus with diabetic neuropathy, unspecified: Secondary | ICD-10-CM | POA: Diagnosis not present

## 2017-04-26 DIAGNOSIS — L851 Acquired keratosis [keratoderma] palmaris et plantaris: Secondary | ICD-10-CM | POA: Diagnosis not present

## 2017-04-29 ENCOUNTER — Other Ambulatory Visit: Payer: Self-pay | Admitting: Internal Medicine

## 2017-05-14 ENCOUNTER — Encounter (INDEPENDENT_AMBULATORY_CARE_PROVIDER_SITE_OTHER): Payer: Medicare Other | Admitting: Ophthalmology

## 2017-05-14 DIAGNOSIS — I10 Essential (primary) hypertension: Secondary | ICD-10-CM

## 2017-05-14 DIAGNOSIS — E11311 Type 2 diabetes mellitus with unspecified diabetic retinopathy with macular edema: Secondary | ICD-10-CM

## 2017-05-14 DIAGNOSIS — H43813 Vitreous degeneration, bilateral: Secondary | ICD-10-CM

## 2017-05-14 DIAGNOSIS — H35033 Hypertensive retinopathy, bilateral: Secondary | ICD-10-CM

## 2017-05-14 DIAGNOSIS — H2513 Age-related nuclear cataract, bilateral: Secondary | ICD-10-CM | POA: Diagnosis not present

## 2017-05-14 DIAGNOSIS — H40013 Open angle with borderline findings, low risk, bilateral: Secondary | ICD-10-CM | POA: Diagnosis not present

## 2017-05-14 DIAGNOSIS — H40033 Anatomical narrow angle, bilateral: Secondary | ICD-10-CM | POA: Diagnosis not present

## 2017-05-14 DIAGNOSIS — Z961 Presence of intraocular lens: Secondary | ICD-10-CM | POA: Diagnosis not present

## 2017-05-14 DIAGNOSIS — H40051 Ocular hypertension, right eye: Secondary | ICD-10-CM

## 2017-05-14 DIAGNOSIS — E113313 Type 2 diabetes mellitus with moderate nonproliferative diabetic retinopathy with macular edema, bilateral: Secondary | ICD-10-CM | POA: Diagnosis not present

## 2017-05-15 ENCOUNTER — Encounter: Payer: Self-pay | Admitting: Internal Medicine

## 2017-05-15 DIAGNOSIS — H40033 Anatomical narrow angle, bilateral: Secondary | ICD-10-CM | POA: Diagnosis not present

## 2017-05-15 DIAGNOSIS — Z961 Presence of intraocular lens: Secondary | ICD-10-CM | POA: Diagnosis not present

## 2017-05-15 DIAGNOSIS — H2513 Age-related nuclear cataract, bilateral: Secondary | ICD-10-CM | POA: Diagnosis not present

## 2017-05-15 DIAGNOSIS — H40013 Open angle with borderline findings, low risk, bilateral: Secondary | ICD-10-CM | POA: Diagnosis not present

## 2017-05-15 DIAGNOSIS — E113313 Type 2 diabetes mellitus with moderate nonproliferative diabetic retinopathy with macular edema, bilateral: Secondary | ICD-10-CM | POA: Diagnosis not present

## 2017-05-15 LAB — HM DIABETES EYE EXAM

## 2017-05-24 DIAGNOSIS — L97521 Non-pressure chronic ulcer of other part of left foot limited to breakdown of skin: Secondary | ICD-10-CM | POA: Diagnosis not present

## 2017-05-24 DIAGNOSIS — L97522 Non-pressure chronic ulcer of other part of left foot with fat layer exposed: Secondary | ICD-10-CM | POA: Diagnosis not present

## 2017-05-24 DIAGNOSIS — E1140 Type 2 diabetes mellitus with diabetic neuropathy, unspecified: Secondary | ICD-10-CM | POA: Diagnosis not present

## 2017-05-24 DIAGNOSIS — Z794 Long term (current) use of insulin: Secondary | ICD-10-CM | POA: Diagnosis not present

## 2017-05-28 DIAGNOSIS — H40033 Anatomical narrow angle, bilateral: Secondary | ICD-10-CM | POA: Diagnosis not present

## 2017-05-28 DIAGNOSIS — E113313 Type 2 diabetes mellitus with moderate nonproliferative diabetic retinopathy with macular edema, bilateral: Secondary | ICD-10-CM | POA: Diagnosis not present

## 2017-05-28 DIAGNOSIS — H40013 Open angle with borderline findings, low risk, bilateral: Secondary | ICD-10-CM | POA: Diagnosis not present

## 2017-05-28 DIAGNOSIS — H2513 Age-related nuclear cataract, bilateral: Secondary | ICD-10-CM | POA: Diagnosis not present

## 2017-05-28 DIAGNOSIS — Z961 Presence of intraocular lens: Secondary | ICD-10-CM | POA: Diagnosis not present

## 2017-06-06 ENCOUNTER — Other Ambulatory Visit: Payer: Self-pay | Admitting: Internal Medicine

## 2017-06-06 DIAGNOSIS — I129 Hypertensive chronic kidney disease with stage 1 through stage 4 chronic kidney disease, or unspecified chronic kidney disease: Secondary | ICD-10-CM | POA: Diagnosis not present

## 2017-06-06 DIAGNOSIS — E871 Hypo-osmolality and hyponatremia: Secondary | ICD-10-CM | POA: Diagnosis not present

## 2017-06-06 DIAGNOSIS — R809 Proteinuria, unspecified: Secondary | ICD-10-CM | POA: Diagnosis not present

## 2017-06-06 DIAGNOSIS — N183 Chronic kidney disease, stage 3 (moderate): Secondary | ICD-10-CM | POA: Diagnosis not present

## 2017-06-06 DIAGNOSIS — E1122 Type 2 diabetes mellitus with diabetic chronic kidney disease: Secondary | ICD-10-CM | POA: Diagnosis not present

## 2017-06-06 DIAGNOSIS — I10 Essential (primary) hypertension: Secondary | ICD-10-CM

## 2017-06-06 NOTE — Progress Notes (Signed)
Please call him to set up blood work in about 2 weeks Will be monitoring potassium levels on new medication from his eye doctor

## 2017-06-07 DIAGNOSIS — L97522 Non-pressure chronic ulcer of other part of left foot with fat layer exposed: Secondary | ICD-10-CM | POA: Diagnosis not present

## 2017-06-12 ENCOUNTER — Encounter (INDEPENDENT_AMBULATORY_CARE_PROVIDER_SITE_OTHER): Payer: Medicare Other | Admitting: Ophthalmology

## 2017-06-12 DIAGNOSIS — H43813 Vitreous degeneration, bilateral: Secondary | ICD-10-CM | POA: Diagnosis not present

## 2017-06-12 DIAGNOSIS — E11311 Type 2 diabetes mellitus with unspecified diabetic retinopathy with macular edema: Secondary | ICD-10-CM | POA: Diagnosis not present

## 2017-06-12 DIAGNOSIS — E113313 Type 2 diabetes mellitus with moderate nonproliferative diabetic retinopathy with macular edema, bilateral: Secondary | ICD-10-CM

## 2017-06-12 DIAGNOSIS — I10 Essential (primary) hypertension: Secondary | ICD-10-CM | POA: Diagnosis not present

## 2017-06-12 DIAGNOSIS — H35033 Hypertensive retinopathy, bilateral: Secondary | ICD-10-CM

## 2017-06-13 DIAGNOSIS — E113313 Type 2 diabetes mellitus with moderate nonproliferative diabetic retinopathy with macular edema, bilateral: Secondary | ICD-10-CM | POA: Diagnosis not present

## 2017-06-13 DIAGNOSIS — H40033 Anatomical narrow angle, bilateral: Secondary | ICD-10-CM | POA: Diagnosis not present

## 2017-06-13 DIAGNOSIS — H2513 Age-related nuclear cataract, bilateral: Secondary | ICD-10-CM | POA: Diagnosis not present

## 2017-06-13 DIAGNOSIS — H40013 Open angle with borderline findings, low risk, bilateral: Secondary | ICD-10-CM | POA: Diagnosis not present

## 2017-06-13 DIAGNOSIS — Z961 Presence of intraocular lens: Secondary | ICD-10-CM | POA: Diagnosis not present

## 2017-06-18 ENCOUNTER — Other Ambulatory Visit (INDEPENDENT_AMBULATORY_CARE_PROVIDER_SITE_OTHER): Payer: Medicare Other

## 2017-06-18 DIAGNOSIS — I10 Essential (primary) hypertension: Secondary | ICD-10-CM | POA: Diagnosis not present

## 2017-06-18 LAB — RENAL FUNCTION PANEL
Albumin: 3.6 g/dL (ref 3.5–5.2)
BUN: 25 mg/dL — ABNORMAL HIGH (ref 6–23)
CO2: 22 meq/L (ref 19–32)
Calcium: 9.4 mg/dL (ref 8.4–10.5)
Chloride: 101 mEq/L (ref 96–112)
Creatinine, Ser: 1.49 mg/dL (ref 0.40–1.50)
GFR: 51.26 mL/min — AB (ref >60.00)
GLUCOSE: 194 mg/dL — AB (ref 70–99)
PHOSPHORUS: 3.5 mg/dL (ref 2.3–4.6)
POTASSIUM: 4.3 meq/L (ref 3.5–5.1)
Sodium: 134 mEq/L — ABNORMAL LOW (ref 135–145)

## 2017-07-04 DIAGNOSIS — Z794 Long term (current) use of insulin: Secondary | ICD-10-CM | POA: Diagnosis not present

## 2017-07-04 DIAGNOSIS — E1140 Type 2 diabetes mellitus with diabetic neuropathy, unspecified: Secondary | ICD-10-CM | POA: Diagnosis not present

## 2017-07-04 DIAGNOSIS — L97522 Non-pressure chronic ulcer of other part of left foot with fat layer exposed: Secondary | ICD-10-CM | POA: Diagnosis not present

## 2017-07-05 ENCOUNTER — Other Ambulatory Visit: Payer: Self-pay | Admitting: Internal Medicine

## 2017-07-10 ENCOUNTER — Encounter (INDEPENDENT_AMBULATORY_CARE_PROVIDER_SITE_OTHER): Payer: Medicare Other | Admitting: Ophthalmology

## 2017-07-10 DIAGNOSIS — H2511 Age-related nuclear cataract, right eye: Secondary | ICD-10-CM | POA: Diagnosis not present

## 2017-07-10 DIAGNOSIS — H43813 Vitreous degeneration, bilateral: Secondary | ICD-10-CM

## 2017-07-10 DIAGNOSIS — E11311 Type 2 diabetes mellitus with unspecified diabetic retinopathy with macular edema: Secondary | ICD-10-CM | POA: Diagnosis not present

## 2017-07-10 DIAGNOSIS — I10 Essential (primary) hypertension: Secondary | ICD-10-CM | POA: Diagnosis not present

## 2017-07-10 DIAGNOSIS — H35033 Hypertensive retinopathy, bilateral: Secondary | ICD-10-CM | POA: Diagnosis not present

## 2017-07-10 DIAGNOSIS — E113313 Type 2 diabetes mellitus with moderate nonproliferative diabetic retinopathy with macular edema, bilateral: Secondary | ICD-10-CM

## 2017-07-16 ENCOUNTER — Encounter: Payer: Self-pay | Admitting: Internal Medicine

## 2017-07-16 ENCOUNTER — Ambulatory Visit: Payer: Medicare Other | Admitting: Internal Medicine

## 2017-07-16 VITALS — BP 158/100 | HR 82 | Ht 69.0 in | Wt 242.0 lb

## 2017-07-16 DIAGNOSIS — E1142 Type 2 diabetes mellitus with diabetic polyneuropathy: Secondary | ICD-10-CM | POA: Diagnosis not present

## 2017-07-16 DIAGNOSIS — Z794 Long term (current) use of insulin: Secondary | ICD-10-CM

## 2017-07-16 DIAGNOSIS — E1165 Type 2 diabetes mellitus with hyperglycemia: Secondary | ICD-10-CM

## 2017-07-16 DIAGNOSIS — IMO0002 Reserved for concepts with insufficient information to code with codable children: Secondary | ICD-10-CM

## 2017-07-16 DIAGNOSIS — E785 Hyperlipidemia, unspecified: Secondary | ICD-10-CM

## 2017-07-16 LAB — POCT GLYCOSYLATED HEMOGLOBIN (HGB A1C): Hemoglobin A1C: 9.3 % — AB (ref 4.0–5.6)

## 2017-07-16 MED ORDER — GLUCOSE BLOOD VI STRP: ORAL_STRIP | 3 refills | Status: DC

## 2017-07-16 MED ORDER — ACCU-CHEK GUIDE W/DEVICE KIT: 1.0000 | PACK | Freq: Two times a day (BID) | 0 refills | Status: AC

## 2017-07-16 MED ORDER — INSULIN NPH (HUMAN) (ISOPHANE) 100 UNIT/ML ~~LOC~~ SUSP: 30.0000 [IU] | Freq: Two times a day (BID) | SUBCUTANEOUS | 11 refills | Status: DC

## 2017-07-16 MED ORDER — INSULIN REGULAR HUMAN 100 UNIT/ML IJ SOLN: 30.0000 [IU] | Freq: Three times a day (TID) | INTRAMUSCULAR | 11 refills | Status: DC

## 2017-07-16 MED ORDER — ACCU-CHEK SOFTCLIX LANCETS MISC: 3 refills | Status: AC

## 2017-07-16 NOTE — Patient Instructions (Addendum)
Please change:  N and R insulin (ReliOn): Insulin Before brunch Before dinner  Regular (clear) 30 40  NPH (cloudy) 30 40   - Jardiance 10 mg before b'fast  Please return in 3 months with your sugar log.

## 2017-07-16 NOTE — Progress Notes (Signed)
Patient ID: Aaron Mckenzie, male   DOB: 09/16/1960, 57 y.o.   MRN: [id number]  HPI: Aaron Mckenzie is a 57 y.o.-year-old male, returning for f/u for DM2, dx 2004, insulin-dependent since 12/2012, uncontrolled, with complications (CKD, neuropathy, DR). Last visit 3 months ago.  He sees Dr. Thresa Mckenzie with [facility] for DM foot ulcer >> almost healed.   Last hemoglobin A1c was:  04/19/2017: The HbA1c calculated from fructosamine is much better: 7.8%! Lab Results  Component Value Date   HGBA1C 11.5 04/19/2017   HGBA1C 10.2 01/16/2017   HGBA1C 9.9 09/26/2016  01/16/2017: 10.2%  Pt was on: U500 insulin: - 20 units before a smaller meal - 22 units before a larger meal. Sliding scale of insulin: - 150-175: + 1 unit  - 176-200: + 2 units  - 201-225: + 3 units  - > 226: + 4 units  We stopped Glipizide 5 mg bid when we started mealtime insulin We had to stop Metformin 2/2 decreased kidney fxn.  He was then on: N and R insulin (ReliOn): Insulin Before brunch Before dinner  Regular 50 40  NPH 60  50    He  is on: - 70/30 insulin - but actually using only R insulin!!!! - 50 units before b'fast - 70 units before dinner - Jardiance 10 mg before b'fast - started 03/2017   Pt checks sugars twice a day (eats 2 meals a day, but snacks after dinner): - am: 160-240, 279  >> 170-218 >> 200-240 - before lunch: 131, 170 >> n/c >> 223 >> n/c - 2h after lunch: 400, 536 >> n/c  >> 197 >> n/c - before dinner:  120-155, 199, 243 >> 107-122, 138 >> 100-120 - 2h after dinner: 300s >> n/c  - bedtime: 53, 68, 120, 154 >> 180-190 >> n/c Lowest: 120 >>  107 >> 100. Highest: 484 >> 218  >> 240.  Pt's meals are: - Breakfast: eggs, meat, grits, wheat toast - Lunch: may skip or eat late b'fast  - Dinner: chicken/fish/pork chops, 2 sides, salad, baked potatoes - Snacks: 2x a day - apple slices, popcorn  - + CKD, last BUN/creatinine:  Lab Results  Component Value Date   BUN 25 (H) 06/18/2017   CREATININE 1.49 06/18/2017  11/29/2016: BUN/Cr 17/1.31, GFR 60, Glu 484  + Proteinuria: 11/29/2016: 12,806 mg/g  On  lisinopril.  - + HL; reviewed last 3 sets of lipids: Lab Results  Component Value Date   CHOL 175 03/05/2017   CHOL 121 02/15/2016   CHOL 201 (H) 01/29/2015   Lab Results  Component Value Date   HDL 27.50 (L) 03/05/2017   HDL 22.20 (L) 02/15/2016   HDL 24.30 (L) 01/29/2015   No results found for: [facility] Lab Results  Component Value Date   TRIG (H) 03/05/2017    577.0 Triglyceride is over 400; calculations on Lipids are invalid.   TRIG 301.0 (H) 02/15/2016   TRIG 327.0 (H) 01/29/2015   Lab Results  Component Value Date   CHOLHDL 6 03/05/2017   CHOLHDL 5 02/15/2016   CHOLHDL 8 01/29/2015   Lab Results  Component Value Date   LDLDIRECT 76.0 03/05/2017   LDLDIRECT 55.0 02/15/2016   LDLDIRECT 122.0 01/29/2015   On  pravastatin.   On ASA 81.  - last eye exam was in 10/2016: + DR. He saw Dr. Katy Mckenzie >> had cataract sx 08/2016. Has IO injections. + eye drops for glaucoma.  - + numbness and tingling in his feet and  hands. On neurontin 600-600-900 mg  He also has a history of HTN, pseudogout, bilateral carpal tunnel s/p sx in each hand: 1985 and 2000.  ROS: Constitutional: no weight gain/no weight loss, no fatigue, no subjective hyperthermia, no subjective hypothermia Eyes: no blurry vision, no xerophthalmia ENT: no sore throat, no nodules palpated in throat, no dysphagia, no odynophagia, no hoarseness Cardiovascular: no CP/no SOB/no palpitations/no leg swelling Respiratory: no cough/no SOB/no wheezing Gastrointestinal: no N/no V/no D/no C/no acid reflux Musculoskeletal: no muscle aches/no joint aches Skin: no rashes, no hair loss Neurological: no tremors/+ numbness/+ tingling/no dizziness  I reviewed pt's medications, allergies, PMH, social hx, family hx, and changes were documented in the history of present illness. Otherwise, unchanged from my initial  visit note.  Past Medical History:  Diagnosis Date  . Diabetes mellitus   . Ectodermal dysplasia   . Hyperlipidemia   . Hypertension   . Pseudogout   . Type 2 diabetes mellitus with neurological manifestations, uncontrolled ([place]) 04/23/2008   Qualifier: Diagnosis of  By: Aaron Pate MD, [facility]    Past Surgical History:  Procedure Laterality Date  . APPENDECTOMY    . CATARACT EXTRACTION Left 07/2016  . RIB FRACTURE SURGERY  1988   right   Social History   Socioeconomic History  . Marital status: Married    Spouse name: Not on file  . Number of children: Not on file  . Years of education: Not on file  . Highest education level: Not on file  Occupational History  . Occupation: Retired after disability    Comment: [employer] at [employer]  . Financial resource strain: Not on file  . Food insecurity:    Worry: Not on file    Inability: Not on file  . Transportation needs:    Medical: Not on file    Non-medical: Not on file  Tobacco Use  . Smoking status: Never Smoker  . Smokeless tobacco: Never Used  Substance and Sexual Activity  . Alcohol use: No  . Drug use: No  . Sexual activity: Not on file  Lifestyle  . Physical activity:    Days per week: Not on file    Minutes per session: Not on file  . Stress: Not on file  Relationships  . Social connections:    Talks on phone: Not on file    Gets together: Not on file    Attends religious service: Not on file    Active member of club or organization: Not on file    Attends meetings of clubs or organizations: Not on file    Relationship status: Not on file  . Intimate partner violence:    Fear of current or ex partner: Not on file    Emotionally abused: Not on file    Physically abused: Not on file    Forced sexual activity: Not on file  Other Topics Concern  . Not on file  Social History Narrative   No living will or health care POA   Would want wife to make medical decisions for him--alternate  is brother   Would accept CPR but no prolonged ventilation   Not sure about tube feeds   Current Outpatient Medications on File Prior to Visit  Medication Sig Dispense Refill  . aspirin EC 81 MG tablet Take 81 mg by mouth daily.    . brimonidine (ALPHAGAN P) 0.1 % SOLN     . colchicine (COLCRYS) 0.6 MG tablet TAKE 1 TABLET(0.6 MG) BY  MOUTH TWICE DAILY 60 tablet 11  . dorzolamide-timolol (COSOPT) 22.3-6.8 MG/ML ophthalmic solution     . empagliflozin (JARDIANCE) 10 MG TABS tablet Take 10 mg by mouth daily. 30 tablet 5  . furosemide (LASIX) 20 MG tablet TK 1 T PO QD  1  . gabapentin (NEURONTIN) 300 MG capsule Take 3 capsules (900 mg total) by mouth 3 (three) times daily. 810 capsule 2  . insulin NPH-regular Human (NOVOLIN 70/30) (70-30) 100 UNIT/ML injection Inject under skin 50 units in am and 70 units at bedtime 30 mL 11  . insulin regular human CONCENTRATED (HUMULIN R) 500 UNIT/ML injection INJECT 12 TO 22 UNITS UNDER THE SKIN THREE TIMES DAILY BEFORE MEALS 20 mL 0  . INSULIN SYRINGE .5CC/29G 29G X 1/2" 0.5 ML MISC USE TWICE DAILY 100 each 0  . INSULIN SYRINGE .5CC/29G 29G X 1/2" 0.5 ML MISC USE TWICE DAILY 100 each 0  . INSULIN SYRINGE .5CC/29G 29G X 1/2" 0.5 ML MISC USE TWICE DAILY 100 each 0  . lisinopril (PRINIVIL,ZESTRIL) 20 MG tablet TAKE 1 TABLET BY MOUTH EVERY DAY 90 tablet 3  . pravastatin (PRAVACHOL) 40 MG tablet TAKE 1 TABLET(40 MG) BY MOUTH DAILY 90 tablet 0  . timolol (TIMOPTIC) 0.5 % ophthalmic solution     . urea (CARMOL) 10 % cream APPLY TOPICALLY DAILY AS NEEDED FOR FEET 85 g 5   No current facility-administered medications on file prior to visit.    Not on File Family History  Problem Relation Age of Onset  . Arthritis Mother   . Arthritis Father     PE: BP (!) 158/100 (BP Location: Right Arm, Patient Position: Sitting, Cuff Size: Normal)   Pulse 82   Ht 5\' 9"  (1.753 m)   Wt 242 lb (109.8 kg)   SpO2 96%   BMI 35.74 kg/m  Body mass index is 35.74 kg/m. Wt  Readings from Last 3 Encounters:  07/16/17 242 lb (109.8 kg)  04/19/17 244 lb (110.7 kg)  03/05/17 243 lb (110.2 kg)   Constitutional: overweight, in NAD Eyes: PERRLA, EOMI, no exophthalmos ENT: moist mucous membranes, no thyromegaly, no cervical lymphadenopathy Cardiovascular: RRR, No MRG, + pitting edema bilaterally Respiratory: CTA B Gastrointestinal: abdomen soft, NT, ND, BS+ Musculoskeletal: no deformities, strength intact in all 4, + L foot in boot. Skin: moist, warm, no rashes Neurological: no tremor with outstretched hands, DTR normal in all 4  ASSESSMENT: 1. DM2, insulin-dependent, uncontrolled, with complications - increased insulin resistance - PN  -stable - CKD  2. HL  PLAN:  1. Patient with long-standing, uncontrolled, type 2 diabetes, with high insulin resistance, previously on U500 insulin, which unfortunately stopped being covered by his insurance.  His diabetes control worsened afterwards, so we had to increase his 70/30 insulin.  At last visit, we added Jardiance.  His sugars were high in a.m. and at goal before dinner so we increased his premixed insulin at night at that time.  We checked his fructosamine and this rendered a calculated of HbA1c 7.8%, much better than the measured one, which was 11.5%. - At this visit, he is telling me that instead of the premixed insulin that I thought he is taking, he is actually just on the regular insulin (clear)! He is not sure when he did the switch.  This explains why his sugars are high in the morning >> will need to start NPH.  I will advise him to mix the insulins himself, like he did before.  We will continue  Jardiance.   - Reviewed potassium and GFR checked recently and they were stable - I advised him to:  Patient Instructions   Please change:  N and R insulin (ReliOn): Insulin Before brunch Before dinner  Regular (clear) 30 40  NPH (cloudy) 30 40   - Jardiance 10 mg before b'fast  Please return in 3 months with  your sugar log.  - today, HbA1c is 9.3% (better) - continue checking sugars at different times of the day - check 2x a day, rotating checks -given an Accu-Chek guide meter and was sent supplies to his pharmacy - advised for yearly eye exams >> he is UTD - Return to clinic in 3 mo with sugar log    2. HL - Reviewed latest lipid panel from 02/2017: LDL at goal, but higher triglycerides.  At that time, we discussed about stopping aches, bacon, sausage in a.m. - Continues pravastatin without side effects.  Philemon Kingdom, MD PhD [facility] Endocrinology

## 2017-07-18 NOTE — Telephone Encounter (Signed)
This encounter was created in error - please disregard.

## 2017-07-24 DIAGNOSIS — E1140 Type 2 diabetes mellitus with diabetic neuropathy, unspecified: Secondary | ICD-10-CM | POA: Diagnosis not present

## 2017-07-24 DIAGNOSIS — T300 Burn of unspecified body region, unspecified degree: Secondary | ICD-10-CM | POA: Diagnosis not present

## 2017-07-24 DIAGNOSIS — T25221A Burn of second degree of right foot, initial encounter: Secondary | ICD-10-CM | POA: Diagnosis not present

## 2017-07-24 DIAGNOSIS — T24002A Burn of unspecified degree of unspecified site of left lower limb, except ankle and foot, initial encounter: Secondary | ICD-10-CM | POA: Diagnosis not present

## 2017-07-24 DIAGNOSIS — T310 Burns involving less than 10% of body surface: Secondary | ICD-10-CM | POA: Diagnosis not present

## 2017-07-24 DIAGNOSIS — X18XXXA Contact with other hot metals, initial encounter: Secondary | ICD-10-CM | POA: Diagnosis not present

## 2017-07-24 DIAGNOSIS — T25222A Burn of second degree of left foot, initial encounter: Secondary | ICD-10-CM | POA: Diagnosis not present

## 2017-07-25 DIAGNOSIS — E1140 Type 2 diabetes mellitus with diabetic neuropathy, unspecified: Secondary | ICD-10-CM | POA: Diagnosis not present

## 2017-07-25 DIAGNOSIS — Z794 Long term (current) use of insulin: Secondary | ICD-10-CM | POA: Diagnosis not present

## 2017-07-25 DIAGNOSIS — T25221A Burn of second degree of right foot, initial encounter: Secondary | ICD-10-CM | POA: Diagnosis not present

## 2017-07-25 DIAGNOSIS — T25222A Burn of second degree of left foot, initial encounter: Secondary | ICD-10-CM | POA: Diagnosis not present

## 2017-08-03 ENCOUNTER — Other Ambulatory Visit: Payer: Self-pay | Admitting: Internal Medicine

## 2017-08-06 ENCOUNTER — Encounter (INDEPENDENT_AMBULATORY_CARE_PROVIDER_SITE_OTHER): Payer: Medicare Other | Admitting: Ophthalmology

## 2017-08-08 DIAGNOSIS — E1140 Type 2 diabetes mellitus with diabetic neuropathy, unspecified: Secondary | ICD-10-CM | POA: Diagnosis not present

## 2017-08-08 DIAGNOSIS — T25222D Burn of second degree of left foot, subsequent encounter: Secondary | ICD-10-CM | POA: Diagnosis not present

## 2017-08-08 DIAGNOSIS — B351 Tinea unguium: Secondary | ICD-10-CM | POA: Diagnosis not present

## 2017-08-08 DIAGNOSIS — L97521 Non-pressure chronic ulcer of other part of left foot limited to breakdown of skin: Secondary | ICD-10-CM | POA: Diagnosis not present

## 2017-08-08 DIAGNOSIS — T25221D Burn of second degree of right foot, subsequent encounter: Secondary | ICD-10-CM | POA: Diagnosis not present

## 2017-08-09 ENCOUNTER — Encounter (INDEPENDENT_AMBULATORY_CARE_PROVIDER_SITE_OTHER): Payer: Medicare Other | Admitting: Ophthalmology

## 2017-08-16 ENCOUNTER — Encounter (INDEPENDENT_AMBULATORY_CARE_PROVIDER_SITE_OTHER): Payer: Medicare Other | Admitting: Ophthalmology

## 2017-08-16 DIAGNOSIS — H35033 Hypertensive retinopathy, bilateral: Secondary | ICD-10-CM | POA: Diagnosis not present

## 2017-08-16 DIAGNOSIS — H2511 Age-related nuclear cataract, right eye: Secondary | ICD-10-CM

## 2017-08-16 DIAGNOSIS — E11311 Type 2 diabetes mellitus with unspecified diabetic retinopathy with macular edema: Secondary | ICD-10-CM

## 2017-08-16 DIAGNOSIS — E113313 Type 2 diabetes mellitus with moderate nonproliferative diabetic retinopathy with macular edema, bilateral: Secondary | ICD-10-CM

## 2017-08-16 DIAGNOSIS — I10 Essential (primary) hypertension: Secondary | ICD-10-CM

## 2017-08-16 DIAGNOSIS — H43813 Vitreous degeneration, bilateral: Secondary | ICD-10-CM | POA: Diagnosis not present

## 2017-08-19 ENCOUNTER — Other Ambulatory Visit: Payer: Self-pay | Admitting: Internal Medicine

## 2017-09-10 DIAGNOSIS — L97522 Non-pressure chronic ulcer of other part of left foot with fat layer exposed: Secondary | ICD-10-CM | POA: Diagnosis not present

## 2017-09-10 DIAGNOSIS — L97511 Non-pressure chronic ulcer of other part of right foot limited to breakdown of skin: Secondary | ICD-10-CM | POA: Diagnosis not present

## 2017-09-10 DIAGNOSIS — T25221D Burn of second degree of right foot, subsequent encounter: Secondary | ICD-10-CM | POA: Diagnosis not present

## 2017-09-10 DIAGNOSIS — T25222D Burn of second degree of left foot, subsequent encounter: Secondary | ICD-10-CM | POA: Diagnosis not present

## 2017-09-13 ENCOUNTER — Encounter (INDEPENDENT_AMBULATORY_CARE_PROVIDER_SITE_OTHER): Payer: Medicare Other | Admitting: Ophthalmology

## 2017-09-13 DIAGNOSIS — I10 Essential (primary) hypertension: Secondary | ICD-10-CM | POA: Diagnosis not present

## 2017-09-13 DIAGNOSIS — H35033 Hypertensive retinopathy, bilateral: Secondary | ICD-10-CM | POA: Diagnosis not present

## 2017-09-13 DIAGNOSIS — E11311 Type 2 diabetes mellitus with unspecified diabetic retinopathy with macular edema: Secondary | ICD-10-CM

## 2017-09-13 DIAGNOSIS — H2511 Age-related nuclear cataract, right eye: Secondary | ICD-10-CM

## 2017-09-13 DIAGNOSIS — E113313 Type 2 diabetes mellitus with moderate nonproliferative diabetic retinopathy with macular edema, bilateral: Secondary | ICD-10-CM

## 2017-09-13 DIAGNOSIS — H43813 Vitreous degeneration, bilateral: Secondary | ICD-10-CM

## 2017-10-01 DIAGNOSIS — L97521 Non-pressure chronic ulcer of other part of left foot limited to breakdown of skin: Secondary | ICD-10-CM | POA: Diagnosis not present

## 2017-10-09 DIAGNOSIS — Z961 Presence of intraocular lens: Secondary | ICD-10-CM | POA: Diagnosis not present

## 2017-10-09 DIAGNOSIS — H40013 Open angle with borderline findings, low risk, bilateral: Secondary | ICD-10-CM | POA: Diagnosis not present

## 2017-10-09 DIAGNOSIS — H2513 Age-related nuclear cataract, bilateral: Secondary | ICD-10-CM | POA: Diagnosis not present

## 2017-10-09 DIAGNOSIS — H40033 Anatomical narrow angle, bilateral: Secondary | ICD-10-CM | POA: Diagnosis not present

## 2017-10-09 DIAGNOSIS — E113313 Type 2 diabetes mellitus with moderate nonproliferative diabetic retinopathy with macular edema, bilateral: Secondary | ICD-10-CM | POA: Diagnosis not present

## 2017-10-11 ENCOUNTER — Encounter (INDEPENDENT_AMBULATORY_CARE_PROVIDER_SITE_OTHER): Payer: Medicare Other | Admitting: Ophthalmology

## 2017-10-11 DIAGNOSIS — E11311 Type 2 diabetes mellitus with unspecified diabetic retinopathy with macular edema: Secondary | ICD-10-CM | POA: Diagnosis not present

## 2017-10-11 DIAGNOSIS — H43813 Vitreous degeneration, bilateral: Secondary | ICD-10-CM | POA: Diagnosis not present

## 2017-10-11 DIAGNOSIS — H35033 Hypertensive retinopathy, bilateral: Secondary | ICD-10-CM

## 2017-10-11 DIAGNOSIS — I10 Essential (primary) hypertension: Secondary | ICD-10-CM

## 2017-10-11 DIAGNOSIS — E113313 Type 2 diabetes mellitus with moderate nonproliferative diabetic retinopathy with macular edema, bilateral: Secondary | ICD-10-CM

## 2017-10-11 DIAGNOSIS — H2511 Age-related nuclear cataract, right eye: Secondary | ICD-10-CM

## 2017-11-01 ENCOUNTER — Encounter (INDEPENDENT_AMBULATORY_CARE_PROVIDER_SITE_OTHER): Payer: Medicare Other | Admitting: Ophthalmology

## 2017-11-01 DIAGNOSIS — E113313 Type 2 diabetes mellitus with moderate nonproliferative diabetic retinopathy with macular edema, bilateral: Secondary | ICD-10-CM

## 2017-11-01 DIAGNOSIS — I10 Essential (primary) hypertension: Secondary | ICD-10-CM

## 2017-11-01 DIAGNOSIS — H35033 Hypertensive retinopathy, bilateral: Secondary | ICD-10-CM | POA: Diagnosis not present

## 2017-11-01 DIAGNOSIS — H43813 Vitreous degeneration, bilateral: Secondary | ICD-10-CM | POA: Diagnosis not present

## 2017-11-01 DIAGNOSIS — H2511 Age-related nuclear cataract, right eye: Secondary | ICD-10-CM

## 2017-11-01 DIAGNOSIS — E11311 Type 2 diabetes mellitus with unspecified diabetic retinopathy with macular edema: Secondary | ICD-10-CM

## 2017-11-04 DIAGNOSIS — H2511 Age-related nuclear cataract, right eye: Secondary | ICD-10-CM | POA: Diagnosis not present

## 2017-11-08 DIAGNOSIS — H268 Other specified cataract: Secondary | ICD-10-CM | POA: Diagnosis not present

## 2017-11-08 DIAGNOSIS — H2511 Age-related nuclear cataract, right eye: Secondary | ICD-10-CM | POA: Diagnosis not present

## 2017-11-09 ENCOUNTER — Emergency Department: Payer: Medicare Other

## 2017-11-09 ENCOUNTER — Other Ambulatory Visit: Payer: Self-pay

## 2017-11-09 ENCOUNTER — Encounter
Admission: EM | Disposition: A | Discharge status: Home / Self Care | Payer: Self-pay | Source: Home / Self Care | Attending: Internal Medicine

## 2017-11-09 ENCOUNTER — Inpatient Hospital Stay
Admission: EM | Admit: 2017-11-09 | Discharge: 2017-11-14 | DRG: 250 | Disposition: A | Discharge status: Home / Self Care | Payer: Medicare Other | Attending: Internal Medicine | Admitting: Internal Medicine

## 2017-11-09 ENCOUNTER — Encounter: Payer: Self-pay | Admitting: Emergency Medicine

## 2017-11-09 DIAGNOSIS — Z7982 Long term (current) use of aspirin: Secondary | ICD-10-CM

## 2017-11-09 DIAGNOSIS — R918 Other nonspecific abnormal finding of lung field: Secondary | ICD-10-CM | POA: Diagnosis not present

## 2017-11-09 DIAGNOSIS — E785 Hyperlipidemia, unspecified: Secondary | ICD-10-CM | POA: Diagnosis present

## 2017-11-09 DIAGNOSIS — I2119 ST elevation (STEMI) myocardial infarction involving other coronary artery of inferior wall: Principal | ICD-10-CM | POA: Diagnosis present

## 2017-11-09 DIAGNOSIS — I130 Hypertensive heart and chronic kidney disease with heart failure and stage 1 through stage 4 chronic kidney disease, or unspecified chronic kidney disease: Secondary | ICD-10-CM | POA: Diagnosis present

## 2017-11-09 DIAGNOSIS — Z9049 Acquired absence of other specified parts of digestive tract: Secondary | ICD-10-CM | POA: Diagnosis not present

## 2017-11-09 DIAGNOSIS — I213 ST elevation (STEMI) myocardial infarction of unspecified site: Secondary | ICD-10-CM

## 2017-11-09 DIAGNOSIS — Z794 Long term (current) use of insulin: Secondary | ICD-10-CM

## 2017-11-09 DIAGNOSIS — I2510 Atherosclerotic heart disease of native coronary artery without angina pectoris: Secondary | ICD-10-CM | POA: Diagnosis present

## 2017-11-09 DIAGNOSIS — I5021 Acute systolic (congestive) heart failure: Secondary | ICD-10-CM | POA: Diagnosis present

## 2017-11-09 DIAGNOSIS — E1149 Type 2 diabetes mellitus with other diabetic neurological complication: Secondary | ICD-10-CM | POA: Diagnosis not present

## 2017-11-09 DIAGNOSIS — Z79899 Other long term (current) drug therapy: Secondary | ICD-10-CM | POA: Diagnosis not present

## 2017-11-09 DIAGNOSIS — Z6836 Body mass index (BMI) 36.0-36.9, adult: Secondary | ICD-10-CM

## 2017-11-09 DIAGNOSIS — I5020 Unspecified systolic (congestive) heart failure: Secondary | ICD-10-CM | POA: Diagnosis not present

## 2017-11-09 DIAGNOSIS — M1120 Other chondrocalcinosis, unspecified site: Secondary | ICD-10-CM | POA: Diagnosis present

## 2017-11-09 DIAGNOSIS — N183 Chronic kidney disease, stage 3 (moderate): Secondary | ICD-10-CM | POA: Diagnosis present

## 2017-11-09 DIAGNOSIS — R0603 Acute respiratory distress: Secondary | ICD-10-CM | POA: Diagnosis not present

## 2017-11-09 DIAGNOSIS — R809 Proteinuria, unspecified: Secondary | ICD-10-CM | POA: Diagnosis present

## 2017-11-09 DIAGNOSIS — N170 Acute kidney failure with tubular necrosis: Secondary | ICD-10-CM | POA: Diagnosis not present

## 2017-11-09 DIAGNOSIS — N179 Acute kidney failure, unspecified: Secondary | ICD-10-CM | POA: Diagnosis not present

## 2017-11-09 DIAGNOSIS — T508X5A Adverse effect of diagnostic agents, initial encounter: Secondary | ICD-10-CM | POA: Diagnosis present

## 2017-11-09 DIAGNOSIS — R079 Chest pain, unspecified: Secondary | ICD-10-CM | POA: Diagnosis not present

## 2017-11-09 DIAGNOSIS — E871 Hypo-osmolality and hyponatremia: Secondary | ICD-10-CM | POA: Diagnosis not present

## 2017-11-09 DIAGNOSIS — R05 Cough: Secondary | ICD-10-CM | POA: Diagnosis not present

## 2017-11-09 DIAGNOSIS — N141 Nephropathy induced by other drugs, medicaments and biological substances: Secondary | ICD-10-CM | POA: Diagnosis not present

## 2017-11-09 DIAGNOSIS — M109 Gout, unspecified: Secondary | ICD-10-CM | POA: Diagnosis not present

## 2017-11-09 DIAGNOSIS — I2102 ST elevation (STEMI) myocardial infarction involving left anterior descending coronary artery: Secondary | ICD-10-CM | POA: Diagnosis not present

## 2017-11-09 DIAGNOSIS — J9601 Acute respiratory failure with hypoxia: Secondary | ICD-10-CM | POA: Diagnosis not present

## 2017-11-09 DIAGNOSIS — E1122 Type 2 diabetes mellitus with diabetic chronic kidney disease: Secondary | ICD-10-CM | POA: Diagnosis present

## 2017-11-09 DIAGNOSIS — E669 Obesity, unspecified: Secondary | ICD-10-CM | POA: Diagnosis present

## 2017-11-09 DIAGNOSIS — I219 Acute myocardial infarction, unspecified: Secondary | ICD-10-CM | POA: Diagnosis not present

## 2017-11-09 DIAGNOSIS — J810 Acute pulmonary edema: Secondary | ICD-10-CM | POA: Diagnosis not present

## 2017-11-09 DIAGNOSIS — I129 Hypertensive chronic kidney disease with stage 1 through stage 4 chronic kidney disease, or unspecified chronic kidney disease: Secondary | ICD-10-CM | POA: Diagnosis not present

## 2017-11-09 HISTORY — PX: CORONARY BALLOON ANGIOPLASTY: CATH118233

## 2017-11-09 HISTORY — PX: LEFT HEART CATH AND CORONARY ANGIOGRAPHY: CATH118249

## 2017-11-09 LAB — CBC
HCT: 39.8 % — ABNORMAL LOW (ref 40.0–52.0)
HEMOGLOBIN: 13.4 g/dL (ref 13.0–18.0)
MCH: 28.7 pg (ref 26.0–34.0)
MCHC: 33.7 g/dL (ref 32.0–36.0)
MCV: 85 fL (ref 80.0–100.0)
PLATELETS: 264 10*3/uL (ref 150–440)
RBC: 4.68 MIL/uL (ref 4.40–5.90)
RDW: 15 % — ABNORMAL HIGH (ref 11.5–14.5)
WBC: 9.2 10*3/uL (ref 3.8–10.6)

## 2017-11-09 LAB — POCT ACTIVATED CLOTTING TIME: Activated Clotting Time: 346 seconds

## 2017-11-09 LAB — BASIC METABOLIC PANEL
ANION GAP: 8 (ref 5–15)
BUN: 32 mg/dL — AB (ref 6–20)
CHLORIDE: 106 mmol/L (ref 98–111)
CO2: 22 mmol/L (ref 22–32)
Calcium: 8.6 mg/dL — ABNORMAL LOW (ref 8.9–10.3)
Creatinine, Ser: 1.78 mg/dL — ABNORMAL HIGH (ref 0.61–1.24)
GFR calc Af Amer: 46 mL/min — ABNORMAL LOW (ref >60)
GFR, EST NON AFRICAN AMERICAN: 40 mL/min — AB (ref >60)
Glucose, Bld: 220 mg/dL — ABNORMAL HIGH (ref 70–99)
POTASSIUM: 4.1 mmol/L (ref 3.5–5.1)
SODIUM: 136 mmol/L (ref 135–145)

## 2017-11-09 LAB — PROTIME-INR
INR: 1.05
PROTHROMBIN TIME: 13.6 s (ref 11.4–15.2)

## 2017-11-09 LAB — APTT: APTT: 27 s (ref 24–36)

## 2017-11-09 LAB — TROPONIN I: TROPONIN I: 0.29 ng/mL — AB (ref <0.03)

## 2017-11-09 LAB — BRAIN NATRIURETIC PEPTIDE: B NATRIURETIC PEPTIDE 5: 261 pg/mL — AB (ref 0.0–100.0)

## 2017-11-09 SURGERY — LEFT HEART CATH AND CORONARY ANGIOGRAPHY
Anesthesia: Moderate Sedation

## 2017-11-09 MED ORDER — HEPARIN SODIUM (PORCINE) 5000 UNIT/ML IJ SOLN
4000.0000 [IU] | Freq: Once | INTRAMUSCULAR | Status: AC
  Administered 2017-11-09: 4000 [IU] via INTRAVENOUS
  Filled 2017-11-09: qty 1

## 2017-11-09 MED ORDER — BIVALIRUDIN TRIFLUOROACETATE 250 MG IV SOLR
INTRAVENOUS | Status: AC
  Filled 2017-11-09: qty 250

## 2017-11-09 MED ORDER — NITROGLYCERIN IN D5W 200-5 MCG/ML-% IV SOLN
0.0000 ug/min | Freq: Once | INTRAVENOUS | Status: AC
  Administered 2017-11-09: 10 ug/min via INTRAVENOUS
  Filled 2017-11-09: qty 250

## 2017-11-09 MED ORDER — FUROSEMIDE 10 MG/ML IJ SOLN
INTRAMUSCULAR | Status: AC
  Filled 2017-11-09: qty 4

## 2017-11-09 MED ORDER — LIDOCAINE HCL (PF) 1 % IJ SOLN
INTRAMUSCULAR | Status: DC | PRN
  Administered 2017-11-09: 20 mL via SUBCUTANEOUS

## 2017-11-09 MED ORDER — HEPARIN (PORCINE) IN NACL 1000-0.9 UT/500ML-% IV SOLN
INTRAVENOUS | Status: AC
  Filled 2017-11-09: qty 500

## 2017-11-09 MED ORDER — NITROGLYCERIN 5 MG/ML IV SOLN
INTRAVENOUS | Status: AC
  Filled 2017-11-09: qty 10

## 2017-11-09 MED ORDER — HEPARIN (PORCINE) IN NACL 100-0.45 UNIT/ML-% IJ SOLN
1300.0000 [IU]/h | INTRAMUSCULAR | Status: DC
  Filled 2017-11-09: qty 250

## 2017-11-09 MED ORDER — CLOPIDOGREL BISULFATE 75 MG PO TABS
600.0000 mg | ORAL_TABLET | ORAL | Status: AC
  Administered 2017-11-09: 600 mg via ORAL
  Filled 2017-11-09: qty 8

## 2017-11-09 MED ORDER — FUROSEMIDE 10 MG/ML IJ SOLN
20.0000 mg | Freq: Once | INTRAMUSCULAR | Status: AC
  Administered 2017-11-09: 20 mg via INTRAVENOUS
  Filled 2017-11-09: qty 4

## 2017-11-09 MED ORDER — SODIUM CHLORIDE 0.9 % IV SOLN
INTRAVENOUS | Status: DC | PRN
  Administered 2017-11-09 (×2): 1.75 mg/kg/h via INTRAVENOUS

## 2017-11-09 MED ORDER — BIVALIRUDIN BOLUS VIA INFUSION - CUPID
INTRAVENOUS | Status: DC | PRN
  Administered 2017-11-09: 81.675 mg via INTRAVENOUS

## 2017-11-09 MED ORDER — IOPAMIDOL (ISOVUE-300) INJECTION 61%
INTRAVENOUS | Status: DC | PRN
  Administered 2017-11-09: 400 mL

## 2017-11-09 MED ORDER — FUROSEMIDE 10 MG/ML IJ SOLN
INTRAMUSCULAR | Status: DC | PRN
  Administered 2017-11-09: 40 mg via INTRAVENOUS

## 2017-11-09 SURGICAL SUPPLY — 19 items
BALLN TREK RX 2.5X15 (BALLOONS) ×2
BALLOON TREK RX 2.5X15 (BALLOONS) ×1 IMPLANT
CATH INFINITI 5FR ANG PIGTAIL (CATHETERS) ×2 IMPLANT
CATH INFINITI 5FR JL4 (CATHETERS) ×2 IMPLANT
CATH INFINITI 5FR JL5 (CATHETERS) ×2 IMPLANT
CATH VISTA GUIDE 6FR JR4 SH (CATHETERS) ×2 IMPLANT
CATH VISTA GUIDE 6FR XB3.5 (CATHETERS) ×2 IMPLANT
DEVICE CLOSURE MYNXGRIP 6/7F (Vascular Products) ×2 IMPLANT
DEVICE INFLAT 30 PLUS (MISCELLANEOUS) ×2 IMPLANT
DEVICE SAFEGUARD 24CM (GAUZE/BANDAGES/DRESSINGS) ×2 IMPLANT
KIT MANI 3VAL PERCEP (MISCELLANEOUS) ×2 IMPLANT
NEEDLE PERC 18GX7CM (NEEDLE) ×2 IMPLANT
PACK CARDIAC CATH (CUSTOM PROCEDURE TRAY) ×2 IMPLANT
SHEATH AVANTI 6FR X 11CM (SHEATH) ×2 IMPLANT
SYR MEDRAD MARK V 150ML (SYRINGE) ×2 IMPLANT
WIRE G HI TQ BMW 190 (WIRE) ×4 IMPLANT
WIRE GUIDERIGHT .035X150 (WIRE) ×2 IMPLANT
WIRE HI TORQ WHISPER MS 190CM (WIRE) ×2 IMPLANT
WIRE INTUITION PROPEL ST 180CM (WIRE) ×2 IMPLANT

## 2017-11-09 NOTE — Progress Notes (Signed)
RT called to patient bedside by RN per MD request to set up BiPap. MD rquested Bipap on standby for now. Awaiting Cardiologist assessment of patient and possibly going to CATH lab.

## 2017-11-09 NOTE — ED Notes (Signed)
ED Provider at bedside to update

## 2017-11-09 NOTE — ED Notes (Signed)
Cath lab ready. Awaiting cardiologist

## 2017-11-09 NOTE — Progress Notes (Signed)
ANTICOAGULATION CONSULT NOTE - Initial Consult  Pharmacy Consult for Heparin  Indication: chest pain/ACS  No Known Allergies  Patient Measurements: Height: 5\' 9"  (175.3 cm) Weight: 240 lb (108.9 kg) IBW/kg (Calculated) : 70.7 Heparin Dosing Weight: 94.5 kg   Vital Signs: Temp: 100.2 F (37.9 C) (09/13 2201) Temp Source: Oral (09/13 2201) Pulse Rate: 118 (09/13 2201)  Labs: Recent Labs    11/09/17 2154  HGB 13.4  HCT 39.8*  PLT 264  APTT 27  LABPROT 13.6  INR 1.05    CrCl cannot be calculated (Patient's most recent lab result is older than the maximum 21 days allowed.).   Medical History: Past Medical History:  Diagnosis Date  . Diabetes mellitus   . Ectodermal dysplasia   . Hyperlipidemia   . Hypertension   . Pseudogout   . Type 2 diabetes mellitus with neurological manifestations, uncontrolled ([place]) 04/23/2008   Qualifier: Diagnosis of  By: Silvio Pate MD, [facility]     Medications:   (Not in a hospital admission)  Assessment: Pharmacy consulted to dose heparin in this 57 year old male with STEMI.   No prior anticoag noted. CrCl = ?   Goal of Therapy:  Heparin level 0.3-0.7 units/ml Monitor platelets by anticoagulation protocol: Yes   Plan:  Give 4000 units bolus x 1 Start heparin infusion at 1300 units/hr Check anti-Xa level in 6 hours and daily while on heparin Continue to monitor H&H and platelets  Chantae Soo D 11/09/2017,10:20 PM

## 2017-11-09 NOTE — ED Notes (Signed)
Test: troponin Critical Value: 0.29  Name of Provider Notified: quale

## 2017-11-09 NOTE — ED Triage Notes (Addendum)
Pt bib [facility] from home for c/o excessive cough, pink tinged sputum x1.5 hour after "getting too hot" while cleaning after the exterminator came to heat treat the house today. STEMI called in route by EMS 2139. Pt A&O x4, ST 120, 93% on 4l O2 [place], denies chest pain. No cardiac hx reported.

## 2017-11-09 NOTE — ED Notes (Signed)
[place] at bedside.

## 2017-11-09 NOTE — ED Notes (Signed)
Family at bedside.Wife.

## 2017-11-09 NOTE — Progress Notes (Signed)
   11/09/17 2124  Clinical Encounter Type  Visited With Patient and family together  Visit Type Initial;ED   Provided pastoral presence at [facility].  Chaplain could only speak to patient briefly, as multiple staff members were engaged in assessment and treatment.  Chaplain escorted 2 family members and pastor to the [employer] Area when patient went to Cath Lab and let ED staff know family was there and requests an update when possible.

## 2017-11-09 NOTE — ED Provider Notes (Signed)
[facility] Emergency Department Provider Note   ____________________________________________   First MD Initiated Contact with Patient 11/09/17 2156     (approximate)  I have reviewed the triage vital signs and the nursing notes.   HISTORY  Chief Complaint Code STEMI  Shortness of breath  EM caveat: Acuity, dyspnea, and concern for acute coronary syndrome limit full history and physical.  Critical status  HPI Aaron Mckenzie is a 57 y.o. male history of diabetes and hypertension  Patient reports that he was changing the bed after coming home from dinner with his wife when he suddenly started feeling extremely short of breath with a cough.  Reports never had symptoms like this before feeling fatigued and short of breath.  He also has diabetes.  Denies history of heart problems.  Takes no blood thinners, does take medicine for hypertension and diabetes.   Past Medical History:  Diagnosis Date  . Diabetes mellitus   . Ectodermal dysplasia   . Hyperlipidemia   . Hypertension   . Pseudogout   . Type 2 diabetes mellitus with neurological manifestations, uncontrolled ([place]) 04/23/2008   Qualifier: Diagnosis of  By: Silvio Pate MD, [facility]     Patient Active Problem List   Diagnosis Date Noted  . BPH without obstruction/lower urinary tract symptoms 03/05/2017  . Advance directive discussed with patient 02/15/2016  . Uncontrolled type 2 diabetes mellitus with diabetic polyneuropathy, with long-term current use of insulin ([place]) 03/25/2015  . Chronic kidney disease, stage III (moderate) ([place]) 01/26/2014  . Pseudogout involving multiple joints 01/26/2014  . Diabetes mellitus with stage 3 chronic kidney disease ([place]) 01/08/2013  . Health care maintenance 07/08/2012  . Hyperlipidemia   . Essential hypertension, benign 04/23/2008  . 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 APPENDECTOMY    .  CATARACT EXTRACTION Left 07/2016  . [place]   right    Prior to Admission medications   Medication Sig Start Date End Date Taking? Authorizing Provider  ACCU-CHEK SOFTCLIX LANCETS lancets Use as instructed to check blood sugars twice daily. 07/16/17   Philemon Kingdom, MD  aspirin EC 81 MG tablet Take 81 mg by mouth daily.    [provider]  Blood Glucose Monitoring Suppl (ACCU-CHEK GUIDE) w/Device KIT 1 each by Does not apply route 2 (two) times daily. 07/16/17   Philemon Kingdom, MD  brimonidine (ALPHAGAN P) 0.1 % SOLN  09/14/15   [provider]  colchicine (COLCRYS) 0.6 MG tablet TAKE 1 TABLET(0.6 MG) BY MOUTH TWICE DAILY 02/15/16   Venia Carbon, MD  dorzolamide-timolol (COSOPT) 22.3-6.8 MG/ML ophthalmic solution  09/22/16   [provider]  empagliflozin (JARDIANCE) 10 MG TABS tablet Take 10 mg by mouth daily. 04/19/17   Philemon Kingdom, MD  furosemide (LASIX) 20 MG tablet TK 1 T PO QD 12/30/14   [provider]  gabapentin (NEURONTIN) 300 MG capsule Take 3 capsules (900 mg total) by mouth 3 (three) times daily. 05/22/16   Venia Carbon, MD  gabapentin (NEURONTIN) 300 MG capsule TAKE 3 CAPSULES BY MOUTH THREE TIMES DAILY 08/03/17   Viviana Simpler I, MD  glucose blood (ACCU-CHEK GUIDE) test strip Use as instructed to check blood sugars twice daily. 07/16/17   Philemon Kingdom, MD  insulin NPH Human (NOVOLIN N RELION) 100 UNIT/ML injection Inject 0.3-0.4 mLs (30-40 Units total) into the skin 2 (two) times daily before a meal. 07/16/17   Gherghe,  Salena Saner, MD  insulin regular (NOVOLIN R RELION) 100 units/mL injection Inject 0.3-0.4 mLs (30-40 Units total) into the skin 3 (three) times daily before meals. 07/16/17   Philemon Kingdom, MD  INSULIN SYRINGE .5CC/29G 29G X 1/2" 0.5 ML MISC USE TWICE DAILY 01/17/17   Philemon Kingdom, MD  INSULIN SYRINGE .5CC/29G 29G X 1/2" 0.5 ML MISC USE TWICE DAILY 08/22/17   Philemon Kingdom, MD    lisinopril (PRINIVIL,ZESTRIL) 20 MG tablet TAKE 1 TABLET BY MOUTH EVERY DAY 04/04/17   Viviana Simpler I, MD  pravastatin (PRAVACHOL) 40 MG tablet TAKE 1 TABLET(40 MG) BY MOUTH DAILY 01/05/16   Viviana Simpler I, MD  timolol (TIMOPTIC) 0.5 % ophthalmic solution  02/22/16   [provider]  urea (CARMOL) 10 % cream APPLY TOPICALLY DAILY AS NEEDED FOR FEET 06/08/14   Venia Carbon, MD    Allergies Patient has no known allergies.  Family History  Problem Relation Age of Onset  . Arthritis Mother   . Arthritis Father     Social History Social History   Tobacco Use  . Smoking status: Never Smoker  . Smokeless tobacco: Never Used  Substance Use Topics  . Alcohol use: No  . Drug use: No    Review of Systems Constitutional: No fever/chills he felt well until about 1 hour ago.  Symptoms started suddenly. Eyes: No visual changes. ENT: Trouble breathing or neck pain. Cardiovascular: Denies chest pain. Respiratory: Feels moderately short of breath.  Cough. Gastrointestinal: No abdominal pain.  No nausea, no vomiting.   Genitourinary: Negative for dysuria. Musculoskeletal: Negative for back pain. Skin: Negative for rash. Neurological: Negative for headaches, focal weakness or numbness.    ____________________________________________   PHYSICAL EXAM:  VITAL SIGNS: ED Triage Vitals  Enc Vitals Group     BP --      Pulse Rate 11/09/17 2201 (!) 118     Resp 11/09/17 2201 (!) 34     Temp 11/09/17 2201 100.2 F (37.9 C)     Temp Source 11/09/17 2201 Oral     SpO2 11/09/17 2152 90 %     Weight 11/09/17 2204 240 lb (108.9 kg)     Height 11/09/17 2204 5' 9"  (1.753 m)     Head Circumference --      Peak Flow --      Pain Score 11/09/17 2204 0     Pain Loc --      Pain Edu? --      Excl. in [place]? --     Constitutional: Alert and oriented.  Eating upright, slightly pale, dyspneic and mild respiratory distress. Eyes: Conjunctivae are normal. Head: Atraumatic. Nose:  No congestion/rhinnorhea. Mouth/Throat: Mucous membranes are moist. Neck: No stridor.  There is moderate JVD Cardiovascular: Tachycardic rate, regular rhythm. Grossly normal heart sounds.  Good peripheral circulation. Respiratory: Mild to moderately increased work of breathing.  On nasal cannula, oxygenation saturation in the mid 90s.  Rales noted through the lungs bilaterally without wheezing.  Moderate use of accessory muscles. Gastrointestinal: Soft and nontender. No distention. Musculoskeletal: No lower extremity tenderness with 2+ lower extremity pitting edema. Neurologic:  Normal speech and language. No gross focal neurologic deficits are appreciated.  Skin:  Skin is warm, sweaty and intact. No rash noted. Psychiatric: Mood and affect are normal. Speech and behavior are normal.  ____________________________________________   LABS (all labs ordered are listed, but only abnormal results are displayed)  Labs Reviewed  CBC - Abnormal; Notable for the following components:  Result Value   HCT 39.8 (*)    RDW 15.0 (*)    All other components within normal limits  BASIC METABOLIC PANEL - Abnormal; Notable for the following components:   Glucose, Bld 220 (*)    BUN 32 (*)    Creatinine, Ser 1.78 (*)    Calcium 8.6 (*)    GFR calc non Af Amer 40 (*)    GFR calc Af Amer 46 (*)    All other components within normal limits  TROPONIN I - Abnormal; Notable for the following components:   Troponin I 0.29 (*)    All other components within normal limits  PROTIME-INR  APTT  BRAIN NATRIURETIC PEPTIDE   ____________________________________________  EKG  Reviewed and entered by me at 2200 Heart rate 120 QRS 110 QTc 400 Sinus tachycardia with acute ST elevation MI inferior. Interpreted by me as STEMI ____________________________________________  RADIOLOGY  Dg Chest Portable 1 View  Result Date: 11/09/2017 CLINICAL DATA:  Dyspnea EXAM: PORTABLE CHEST 1 VIEW COMPARISON:  None.  FINDINGS: Borderline heart size. No pleural effusion. Moderate diffuse interstitial and ground-glass opacities suspect for edema. No pneumothorax. IMPRESSION: Borderline cardiomegaly. Moderate diffuse interstitial and ground-glass opacity, suspicious for pulmonary edema. Electronically Signed   By: Donavan Foil M.D.   On: 11/09/2017 22:09     Chest x-ray personally viewed by me, reviewed interpretation.  Concerning for acute pulmonary edema ____________________________________________   PROCEDURES  Procedure(s) performed: None  Procedures  Critical Care performed: Yes, see critical care note(s)  CRITICAL CARE Performed by: Delman Kitten   Total critical care time: 40 minutes  Critical care time was exclusive of separately billable procedures and treating other patients.  Critical care was necessary to treat or prevent imminent or life-threatening deterioration.  Critical care was time spent personally by me on the following activities: development of treatment plan with patient and/or surrogate as well as nursing, discussions with consultants, evaluation of patient's response to treatment, examination of patient, obtaining history from patient or surrogate, ordering and performing treatments and interventions, ordering and review of laboratory studies, ordering and review of radiographic studies, pulse oximetry and re-evaluation of patient's condition.  ____________________________________________   INITIAL IMPRESSION / ASSESSMENT AND PLAN / ED COURSE  Pertinent labs & imaging results that were available during my care of the patient were reviewed by me and considered in my medical decision making (see chart for details).  Acute dyspnea.  EKG findings concerning for acute inferior MI.  No chest pain, but also diabetic and the patient's sudden onset of symptoms about an hour prior to arrival to demonstrate acute cardiac/pulmonary symptoms.  Highly concerning for an acute coronary  syndrome, in particular diagnostic EKG demonstrates STEMI.  Cath Lab activated prior to EMS arrival.  Of commute gated with Dr. Clayborn Bigness.    ----------------------------------------- 10:33 PM on 11/09/2017 -----------------------------------------  Updated Dr.: Patient status, chest x-ray troponin and lab work.  He advises giving 20 mg IV Lasix once now.  Have ordered.  Patient alert, oriented work of breathing improving.  Differential diagnosis does still remain broad as he is noted to be slightly febrile, infectious etiology also considered but given his EKG findings it clearly demonstrates what appears to be acute inferior myocardial ischemia.  Suspect flash pulmonary edema associated with acute STEMI.  Treated as recommended by cardiology with Plavix loading, heparin, aspirin given by EMS.  Patient currently pain-free work of breathing slowly improving.  Awaiting arrival of cardiac catheterization team been activated pre-arrival by EMS.  -----------------------------------------  10:37 PM on 11/09/2017 -----------------------------------------  Patient alert and oriented, connected to defibrillator cables.  Going to the Cath Lab escorted by Dr. Clayborn Bigness.  Patient fully oriented, does appear improved especially with regard to work of breathing from presentation.  ____________________________________________   FINAL CLINICAL IMPRESSION(S) / ED DIAGNOSES  Final diagnoses:  ST elevation myocardial infarction (STEMI), unspecified artery ([place])  Acute respiratory distress      NEW MEDICATIONS STARTED DURING THIS VISIT:  New Prescriptions   No medications on file     Note:  This document was prepared using Dragon voice recognition software and may include unintentional dictation errors.     Delman Kitten, MD 11/09/17 2257

## 2017-11-09 NOTE — ED Notes (Signed)
Pt WOB and cough decreased, states feeling some relief

## 2017-11-10 ENCOUNTER — Encounter: Payer: Self-pay | Admitting: Emergency Medicine

## 2017-11-10 ENCOUNTER — Other Ambulatory Visit: Payer: Self-pay

## 2017-11-10 ENCOUNTER — Inpatient Hospital Stay
Admit: 2017-11-10 | Discharge: 2017-11-10 | Disposition: A | Discharge status: Home / Self Care | Payer: Medicare Other | Attending: Internal Medicine | Admitting: Internal Medicine

## 2017-11-10 DIAGNOSIS — Z794 Long term (current) use of insulin: Secondary | ICD-10-CM | POA: Diagnosis not present

## 2017-11-10 DIAGNOSIS — E871 Hypo-osmolality and hyponatremia: Secondary | ICD-10-CM | POA: Diagnosis not present

## 2017-11-10 DIAGNOSIS — I5021 Acute systolic (congestive) heart failure: Secondary | ICD-10-CM | POA: Diagnosis present

## 2017-11-10 DIAGNOSIS — Z9049 Acquired absence of other specified parts of digestive tract: Secondary | ICD-10-CM | POA: Diagnosis not present

## 2017-11-10 DIAGNOSIS — Z7982 Long term (current) use of aspirin: Secondary | ICD-10-CM | POA: Diagnosis not present

## 2017-11-10 DIAGNOSIS — E669 Obesity, unspecified: Secondary | ICD-10-CM | POA: Diagnosis present

## 2017-11-10 DIAGNOSIS — M1120 Other chondrocalcinosis, unspecified site: Secondary | ICD-10-CM | POA: Diagnosis present

## 2017-11-10 DIAGNOSIS — I213 ST elevation (STEMI) myocardial infarction of unspecified site: Secondary | ICD-10-CM | POA: Diagnosis not present

## 2017-11-10 DIAGNOSIS — E785 Hyperlipidemia, unspecified: Secondary | ICD-10-CM | POA: Diagnosis present

## 2017-11-10 DIAGNOSIS — N141 Nephropathy induced by other drugs, medicaments and biological substances: Secondary | ICD-10-CM | POA: Diagnosis present

## 2017-11-10 DIAGNOSIS — E1122 Type 2 diabetes mellitus with diabetic chronic kidney disease: Secondary | ICD-10-CM | POA: Diagnosis present

## 2017-11-10 DIAGNOSIS — I2510 Atherosclerotic heart disease of native coronary artery without angina pectoris: Secondary | ICD-10-CM | POA: Diagnosis present

## 2017-11-10 DIAGNOSIS — N183 Chronic kidney disease, stage 3 (moderate): Secondary | ICD-10-CM | POA: Diagnosis present

## 2017-11-10 DIAGNOSIS — I2119 ST elevation (STEMI) myocardial infarction involving other coronary artery of inferior wall: Secondary | ICD-10-CM | POA: Diagnosis present

## 2017-11-10 DIAGNOSIS — N170 Acute kidney failure with tubular necrosis: Secondary | ICD-10-CM | POA: Diagnosis present

## 2017-11-10 DIAGNOSIS — R809 Proteinuria, unspecified: Secondary | ICD-10-CM | POA: Diagnosis present

## 2017-11-10 DIAGNOSIS — J9601 Acute respiratory failure with hypoxia: Secondary | ICD-10-CM | POA: Diagnosis present

## 2017-11-10 DIAGNOSIS — T508X5A Adverse effect of diagnostic agents, initial encounter: Secondary | ICD-10-CM | POA: Diagnosis present

## 2017-11-10 DIAGNOSIS — Z6836 Body mass index (BMI) 36.0-36.9, adult: Secondary | ICD-10-CM | POA: Diagnosis not present

## 2017-11-10 DIAGNOSIS — I130 Hypertensive heart and chronic kidney disease with heart failure and stage 1 through stage 4 chronic kidney disease, or unspecified chronic kidney disease: Secondary | ICD-10-CM | POA: Diagnosis present

## 2017-11-10 DIAGNOSIS — E1149 Type 2 diabetes mellitus with other diabetic neurological complication: Secondary | ICD-10-CM | POA: Diagnosis present

## 2017-11-10 DIAGNOSIS — R0603 Acute respiratory distress: Secondary | ICD-10-CM | POA: Diagnosis present

## 2017-11-10 DIAGNOSIS — Z79899 Other long term (current) drug therapy: Secondary | ICD-10-CM | POA: Diagnosis not present

## 2017-11-10 DIAGNOSIS — M109 Gout, unspecified: Secondary | ICD-10-CM | POA: Diagnosis present

## 2017-11-10 LAB — TROPONIN I
Troponin I: 34.32 ng/mL (ref <0.03)
Troponin I: 26.14 ng/mL (ref <0.03)

## 2017-11-10 LAB — GLUCOSE, CAPILLARY
GLUCOSE-CAPILLARY: 184 mg/dL — AB (ref 70–99)
Glucose-Capillary: 135 mg/dL — ABNORMAL HIGH (ref 70–99)
Glucose-Capillary: 195 mg/dL — ABNORMAL HIGH (ref 70–99)
Glucose-Capillary: 210 mg/dL — ABNORMAL HIGH (ref 70–99)
Glucose-Capillary: 232 mg/dL — ABNORMAL HIGH (ref 70–99)

## 2017-11-10 LAB — CBC
HCT: 32.5 % — ABNORMAL LOW (ref 40.0–52.0)
Hemoglobin: 11.1 g/dL — ABNORMAL LOW (ref 13.0–18.0)
MCH: 28.8 pg (ref 26.0–34.0)
MCHC: 34.3 g/dL (ref 32.0–36.0)
MCV: 84 fL (ref 80.0–100.0)
PLATELETS: 212 10*3/uL (ref 150–440)
RBC: 3.87 MIL/uL — AB (ref 4.40–5.90)
RDW: 14.4 % (ref 11.5–14.5)
WBC: 8 10*3/uL (ref 3.8–10.6)

## 2017-11-10 LAB — LIPID PANEL
CHOL/HDL RATIO: 8.2 ratio
CHOLESTEROL: 155 mg/dL (ref 0–200)
HDL: 19 mg/dL — AB (ref >40)
LDL CALC: 91 mg/dL (ref 0–99)
Triglycerides: 223 mg/dL — ABNORMAL HIGH (ref <150)
VLDL: 45 mg/dL — ABNORMAL HIGH (ref 0–40)

## 2017-11-10 LAB — BASIC METABOLIC PANEL
Anion gap: 9 (ref 5–15)
BUN: 34 mg/dL — ABNORMAL HIGH (ref 6–20)
CALCIUM: 8.1 mg/dL — AB (ref 8.9–10.3)
CO2: 22 mmol/L (ref 22–32)
CREATININE: 1.89 mg/dL — AB (ref 0.61–1.24)
Chloride: 105 mmol/L (ref 98–111)
GFR, EST AFRICAN AMERICAN: 43 mL/min — AB (ref >60)
GFR, EST NON AFRICAN AMERICAN: 37 mL/min — AB (ref >60)
Glucose, Bld: 213 mg/dL — ABNORMAL HIGH (ref 70–99)
Potassium: 4.1 mmol/L (ref 3.5–5.1)
SODIUM: 136 mmol/L (ref 135–145)

## 2017-11-10 LAB — HEMOGLOBIN A1C
HEMOGLOBIN A1C: 9 % — AB (ref 4.8–5.6)
Mean Plasma Glucose: 211.6 mg/dL

## 2017-11-10 LAB — MRSA PCR SCREENING: MRSA by PCR: NEGATIVE

## 2017-11-10 MED ORDER — DOCUSATE SODIUM 100 MG PO CAPS
100.0000 mg | ORAL_CAPSULE | Freq: Two times a day (BID) | ORAL | Status: DC
  Administered 2017-11-10 – 2017-11-14 (×8): 100 mg via ORAL
  Filled 2017-11-10 (×9): qty 1

## 2017-11-10 MED ORDER — FUROSEMIDE 10 MG/ML IJ SOLN
40.0000 mg | Freq: Two times a day (BID) | INTRAMUSCULAR | Status: DC
  Administered 2017-11-10 (×2): 40 mg via INTRAVENOUS
  Filled 2017-11-10 (×2): qty 4

## 2017-11-10 MED ORDER — GABAPENTIN 300 MG PO CAPS
900.0000 mg | ORAL_CAPSULE | Freq: Three times a day (TID) | ORAL | Status: DC
  Administered 2017-11-10 – 2017-11-12 (×7): 900 mg via ORAL
  Filled 2017-11-10 (×7): qty 3

## 2017-11-10 MED ORDER — INSULIN ASPART 100 UNIT/ML ~~LOC~~ SOLN
0.0000 [IU] | Freq: Three times a day (TID) | SUBCUTANEOUS | Status: DC
  Administered 2017-11-10: 3 [IU] via SUBCUTANEOUS
  Administered 2017-11-10: 2 [IU] via SUBCUTANEOUS
  Administered 2017-11-10: 5 [IU] via SUBCUTANEOUS
  Administered 2017-11-11 (×3): 3 [IU] via SUBCUTANEOUS
  Administered 2017-11-12: 2 [IU] via SUBCUTANEOUS
  Administered 2017-11-12 – 2017-11-14 (×4): 3 [IU] via SUBCUTANEOUS
  Administered 2017-11-14: 2 [IU] via SUBCUTANEOUS
  Filled 2017-11-10 (×12): qty 1

## 2017-11-10 MED ORDER — SODIUM CHLORIDE 0.9 % IV SOLN: 250.0000 mL | INTRAVENOUS | Status: DC | PRN

## 2017-11-10 MED ORDER — ACETAMINOPHEN 325 MG PO TABS: 650.0000 mg | ORAL_TABLET | Freq: Four times a day (QID) | ORAL | Status: DC | PRN

## 2017-11-10 MED ORDER — INSULIN ASPART 100 UNIT/ML ~~LOC~~ SOLN
0.0000 [IU] | Freq: Every day | SUBCUTANEOUS | Status: DC
  Administered 2017-11-10: 2 [IU] via SUBCUTANEOUS

## 2017-11-10 MED ORDER — HEPARIN SODIUM (PORCINE) 5000 UNIT/ML IJ SOLN
5000.0000 [IU] | Freq: Three times a day (TID) | INTRAMUSCULAR | Status: DC
  Administered 2017-11-10 – 2017-11-14 (×11): 5000 [IU] via SUBCUTANEOUS
  Filled 2017-11-10 (×11): qty 1

## 2017-11-10 MED ORDER — ONDANSETRON HCL 4 MG/2ML IJ SOLN: 4.0000 mg | Freq: Four times a day (QID) | INTRAMUSCULAR | Status: DC | PRN

## 2017-11-10 MED ORDER — BESIFLOXACIN HCL 0.6 % OP SUSP
1.0000 [drp] | Freq: Two times a day (BID) | OPHTHALMIC | Status: DC
  Administered 2017-11-10 – 2017-11-12 (×5): 1 [drp] via OPHTHALMIC
  Filled 2017-11-10: qty 5

## 2017-11-10 MED ORDER — HEPARIN (PORCINE) IN NACL 1000-0.9 UT/500ML-% IV SOLN
INTRAVENOUS | Status: DC | PRN
  Administered 2017-11-10: 500 mL

## 2017-11-10 MED ORDER — PERFLUTREN LIPID MICROSPHERE
1.0000 mL | INTRAVENOUS | Status: AC | PRN
  Administered 2017-11-10: 5 mL via INTRAVENOUS
  Filled 2017-11-10: qty 10

## 2017-11-10 MED ORDER — SODIUM CHLORIDE 0.9% FLUSH
3.0000 mL | Freq: Two times a day (BID) | INTRAVENOUS | Status: DC
  Administered 2017-11-10 – 2017-11-14 (×6): 3 mL via INTRAVENOUS

## 2017-11-10 MED ORDER — ACETAMINOPHEN 650 MG RE SUPP: 650.0000 mg | Freq: Four times a day (QID) | RECTAL | Status: DC | PRN

## 2017-11-10 MED ORDER — ATORVASTATIN CALCIUM 20 MG PO TABS
80.0000 mg | ORAL_TABLET | Freq: Every day | ORAL | Status: DC
  Administered 2017-11-10 – 2017-11-13 (×4): 80 mg via ORAL
  Filled 2017-11-10 (×4): qty 4

## 2017-11-10 MED ORDER — ONDANSETRON HCL 4 MG PO TABS: 4.0000 mg | ORAL_TABLET | Freq: Four times a day (QID) | ORAL | Status: DC | PRN

## 2017-11-10 MED ORDER — BRIMONIDINE TARTRATE 0.1 % OP SOLN
1.0000 [drp] | Freq: Two times a day (BID) | OPHTHALMIC | Status: DC
  Administered 2017-11-10 – 2017-11-14 (×9): 1 [drp] via OPHTHALMIC
  Filled 2017-11-10: qty 5

## 2017-11-10 MED ORDER — NITROGLYCERIN IN D5W 200-5 MCG/ML-% IV SOLN: 0.0000 ug/min | INTRAVENOUS | Status: DC

## 2017-11-10 MED ORDER — HYDROCODONE-ACETAMINOPHEN 5-325 MG PO TABS: 1.0000 | ORAL_TABLET | ORAL | Status: DC | PRN

## 2017-11-10 MED ORDER — ACETAMINOPHEN 325 MG PO TABS: 650.0000 mg | ORAL_TABLET | ORAL | Status: DC | PRN

## 2017-11-10 MED ORDER — SODIUM CHLORIDE 0.9% FLUSH
3.0000 mL | INTRAVENOUS | Status: DC | PRN
  Administered 2017-11-11: 3 mL via INTRAVENOUS
  Filled 2017-11-10: qty 3

## 2017-11-10 MED ORDER — PRAVASTATIN SODIUM 20 MG PO TABS: 40.0000 mg | ORAL_TABLET | Freq: Every day | ORAL | Status: DC

## 2017-11-10 MED ORDER — LISINOPRIL 20 MG PO TABS: 20.0000 mg | ORAL_TABLET | Freq: Every day | ORAL | Status: DC

## 2017-11-10 MED ORDER — INSULIN NPH (HUMAN) (ISOPHANE) 100 UNIT/ML ~~LOC~~ SUSP
25.0000 [IU] | Freq: Two times a day (BID) | SUBCUTANEOUS | Status: DC
  Administered 2017-11-10 – 2017-11-13 (×7): 25 [IU] via SUBCUTANEOUS
  Filled 2017-11-10 (×5): qty 10
  Filled 2017-11-10: qty 0.25
  Filled 2017-11-10: qty 10
  Filled 2017-11-10: qty 0.25
  Filled 2017-11-10: qty 10

## 2017-11-10 MED ORDER — ASPIRIN EC 325 MG PO TBEC
325.0000 mg | DELAYED_RELEASE_TABLET | Freq: Every day | ORAL | Status: DC
  Administered 2017-11-10 – 2017-11-14 (×5): 325 mg via ORAL
  Filled 2017-11-10 (×5): qty 1

## 2017-11-10 MED ORDER — PREDNISOLONE ACETATE 1 % OP SUSP
1.0000 [drp] | Freq: Four times a day (QID) | OPHTHALMIC | Status: DC
  Administered 2017-11-10 – 2017-11-12 (×8): 1 [drp] via OPHTHALMIC
  Filled 2017-11-10: qty 1

## 2017-11-10 MED ORDER — NEPAFENAC 0.3 % OP SUSP
1.0000 [drp] | Freq: Every day | OPHTHALMIC | Status: DC
  Administered 2017-11-10 – 2017-11-12 (×3): 1 [drp] via OPHTHALMIC
  Filled 2017-11-10: qty 3

## 2017-11-10 MED ORDER — SODIUM CHLORIDE 0.9 % WEIGHT BASED INFUSION: 1.0000 mL/kg/h | INTRAVENOUS | Status: AC

## 2017-11-10 MED ORDER — SODIUM CHLORIDE 0.9% FLUSH
3.0000 mL | Freq: Two times a day (BID) | INTRAVENOUS | Status: DC
  Administered 2017-11-10 – 2017-11-11 (×3): 3 mL via INTRAVENOUS

## 2017-11-10 MED ORDER — SODIUM CHLORIDE 0.9 % IV SOLN
Freq: Once | INTRAVENOUS | Status: AC
  Administered 2017-11-10: 02:00:00 via INTRAVENOUS

## 2017-11-10 MED ORDER — BISACODYL 5 MG PO TBEC: 5.0000 mg | DELAYED_RELEASE_TABLET | Freq: Every day | ORAL | Status: DC | PRN

## 2017-11-10 MED ORDER — INSULIN NPH (HUMAN) (ISOPHANE) 100 UNIT/ML ~~LOC~~ SUSP
25.0000 [IU] | Freq: Two times a day (BID) | SUBCUTANEOUS | Status: DC
  Filled 2017-11-10: qty 0.25

## 2017-11-10 MED ORDER — INSULIN NPH (HUMAN) (ISOPHANE) 100 UNIT/ML ~~LOC~~ SUSP
25.0000 [IU] | Freq: Two times a day (BID) | SUBCUTANEOUS | Status: DC
  Filled 2017-11-10: qty 10

## 2017-11-10 NOTE — Progress Notes (Signed)
Pt has remained alert and oriented with no c/o pain. Pt has remained on [facility] for cardiac support, NDN. SpO2 >90%. Lung sounds clear/diminished to auscultation. Lasix given for CXR pulm edema with good UO-11oo this shift thus far; however Cr has increased. Pt remains on Nitro gtt for BP control. Pts home lisinopril HELD for Cr. New orders are need for BP control to wean off Nitro gtt. Pt has remained in NSR on cardiac monitor, BP/HR WNL. Pt wife has remained at bedside. Pt with orders to transfer to telemetry.

## 2017-11-10 NOTE — Progress Notes (Signed)
CH received an OR for (AD) education/completion. CH presented to the room of Aaron Mckenzie, whom was being prepared to transfer off the unit onto floor bed. Pastoral presences ensued. He assured me that this was patient driven, and he wanted a means for his desires to be fulfilled medically if he was debilitated mentally and his wife( who wasn't present at the time of the visit) was unable to assure that his desires commensurate with his care were honored. I left two (AD) packets for both he and his wife to review, and would return later for additional education and likely completion of (AD)'s tomorrow when a notary would be available   11/10/17 1400  Clinical Encounter Type  Visited With Patient  Visit Type Initial  Referral From Physician  Consult/Referral To [place] (For Healthcare)  Does Patient Have a Medical Advance Directive? No  Would patient like information on creating a medical advance directive? Yes (Inpatient - patient requests chaplain consult to create a medical advance directive)  [place] Directives  Does Patient Have a Mental Health Advance Directive? No  Would patient like information on creating a mental health advance directive? No - Patient declined

## 2017-11-10 NOTE — Progress Notes (Signed)
*  PRELIMINARY RESULTS* Echocardiogram 2D Echocardiogram has been performed. Definity IV Contrast used on this study.  Aaron Mckenzie 11/10/2017, 4:30 PM

## 2017-11-10 NOTE — Progress Notes (Addendum)
CRITICAL VALUE ALERT  Critical Value: Troponin level...  Date & Time Notied: 11/10/2017 + 4:30P...  Provider Notified: Dr. Clayborn Bigness (paged, awaiting response)...  Orders Received/Actions taken: None (thus far)...  Dr. Clayborn Bigness called back, result given. No new orders at this time. Will continue to monitor. 5:00 PM   Wenda Low [facility]

## 2017-11-10 NOTE — H&P (Addendum)
[facility] [place] NAME: Aaron Mckenzie    MR#:  [id number]  DATE OF BIRTH:  [birth year]  DATE OF ADMISSION:  11/09/2017  PRIMARY CARE PHYSICIAN: Venia Carbon, MD   REQUESTING/REFERRING PHYSICIAN:   CHIEF COMPLAINT:   Chief Complaint  Patient presents with  . Code STEMI    HISTORY OF PRESENT ILLNESS: Aaron Mckenzie  is a 57 y.o. male with a known history of obesity, diabetes type 2, hypertension and other comorbidities. Patient presented to emergency room for acute onset of moderate shortness of breath and dry cough, started suddenly after dinner.  His symptoms are worse with exertion.  Patient denies any chest pain or palpitations, he denies having any similar episodes in the past. Blood test done emergency room, are notable for elevated creatinine level at 1.78 and elevated troponin level at 0.29. EKG shows sinus tachycardia with heart rate at 120 and acute ST elevation in the inferior leads. Chest x-ray shows pulmonary edema. Code STEMI is called.  PAST MEDICAL HISTORY:   Past Medical History:  Diagnosis Date  . Diabetes mellitus   . Ectodermal dysplasia   . Hyperlipidemia   . Hypertension   . Pseudogout   . Type 2 diabetes mellitus with neurological manifestations, uncontrolled ([place]) 04/23/2008   Qualifier: Diagnosis of  By: Silvio Pate MD, [facility]     PAST SURGICAL HISTORY:  Past Surgical History:  Procedure Laterality Date  . APPENDECTOMY    . CATARACT EXTRACTION Left 07/2016  . RIB FRACTURE SURGERY  1988   right    SOCIAL HISTORY:  Social History   Tobacco Use  . Smoking status: Never Smoker  . Smokeless tobacco: Never Used  Substance Use Topics  . Alcohol use: No    FAMILY HISTORY:  Family History  Problem Relation Age of Onset  . Arthritis Mother   . Arthritis Father     DRUG ALLERGIES: No Known Allergies  REVIEW OF SYSTEMS:   CONSTITUTIONAL: No fever, but patient complains of fatigue  and generalized weakness weakness, worse with exertion.  EYES: No changes in vision.  EARS, NOSE, AND THROAT: No tinnitus or ear pain.  RESPIRATORY: No cough, shortness of breath, wheezing or hemoptysis.  CARDIOVASCULAR: Positive for chest pain, orthopnea and bilateral lower extremities pitting edema.  GASTROINTESTINAL: No nausea, vomiting, diarrhea or abdominal pain.  GENITOURINARY: No dysuria, hematuria.  ENDOCRINE: No polyuria, nocturia. HEMATOLOGY: No bleeding. SKIN: No rash or lesion. MUSCULOSKELETAL: No joint pain at this time.   NEUROLOGIC: No focal weakness.  PSYCHIATRY: No anxiety or depression.   MEDICATIONS AT HOME:  Prior to Admission medications   Medication Sig Start Date End Date Taking? Authorizing Provider  ACCU-CHEK SOFTCLIX LANCETS lancets Use as instructed to check blood sugars twice daily. 07/16/17   Philemon Kingdom, MD  aspirin EC 81 MG tablet Take 81 mg by mouth daily.    [provider]  Blood Glucose Monitoring Suppl (ACCU-CHEK GUIDE) w/Device KIT 1 each by Does not apply route 2 (two) times daily. 07/16/17   Philemon Kingdom, MD  brimonidine (ALPHAGAN P) 0.1 % SOLN  09/14/15   [provider]  colchicine (COLCRYS) 0.6 MG tablet TAKE 1 TABLET(0.6 MG) BY MOUTH TWICE DAILY 02/15/16   Venia Carbon, MD  dorzolamide-timolol (COSOPT) 22.3-6.8 MG/ML ophthalmic solution  09/22/16   [provider]  empagliflozin (JARDIANCE) 10 MG TABS tablet Take 10 mg by mouth daily. 04/19/17   Philemon Kingdom, MD  furosemide (LASIX) 20  MG tablet TK 1 T PO QD 12/30/14   [provider]  gabapentin (NEURONTIN) 300 MG capsule Take 3 capsules (900 mg total) by mouth 3 (three) times daily. 05/22/16   Venia Carbon, MD  gabapentin (NEURONTIN) 300 MG capsule TAKE 3 CAPSULES BY MOUTH THREE TIMES DAILY 08/03/17   Viviana Simpler I, MD  glucose blood (ACCU-CHEK GUIDE) test strip Use as instructed to check blood sugars twice daily. 07/16/17   Philemon Kingdom, MD  insulin NPH Human (NOVOLIN N RELION) 100 UNIT/ML injection Inject 0.3-0.4 mLs (30-40 Units total) into the skin 2 (two) times daily before a meal. 07/16/17   Philemon Kingdom, MD  insulin regular (NOVOLIN R RELION) 100 units/mL injection Inject 0.3-0.4 mLs (30-40 Units total) into the skin 3 (three) times daily before meals. 07/16/17   Philemon Kingdom, MD  INSULIN SYRINGE .5CC/29G 29G X 1/2" 0.5 ML MISC USE TWICE DAILY 01/17/17   Philemon Kingdom, MD  INSULIN SYRINGE .5CC/29G 29G X 1/2" 0.5 ML MISC USE TWICE DAILY 08/22/17   Philemon Kingdom, MD  lisinopril (PRINIVIL,ZESTRIL) 20 MG tablet TAKE 1 TABLET BY MOUTH EVERY DAY 04/04/17   Viviana Simpler I, MD  pravastatin (PRAVACHOL) 40 MG tablet TAKE 1 TABLET(40 MG) BY MOUTH DAILY 01/05/16   Viviana Simpler I, MD  timolol (TIMOPTIC) 0.5 % ophthalmic solution  02/22/16   [provider]  urea (CARMOL) 10 % cream APPLY TOPICALLY DAILY AS NEEDED FOR FEET 06/08/14   Venia Carbon, MD      PHYSICAL EXAMINATION:   VITAL SIGNS: Blood pressure (!) 149/94, pulse (!) 112, temperature 100.2 F (37.9 C), temperature source Oral, resp. rate (!) 25, height _0  (1.753 m), weight 108.9 kg, SpO2 94 %.  GENERAL:  57 y.o.-year-old patient, obese, sitting up in the bed with mild respiratory distress.  EYES: Pupils equal, round, reactive to light and accommodation. No scleral icterus. Extraocular muscles intact.  HEENT: Head atraumatic, normocephalic. Oropharynx and nasopharynx clear.  NECK:  Supple, no jugular venous distention. No thyroid enlargement, no tenderness.  LUNGS: Reduced breath sounds bilaterally.  Bibasilar crackles are noted on auscultation.  Mild to moderate use of accessory muscles of respiration is noted.  CARDIOVASCULAR: S1, S2 normal. No S3/S4.  ABDOMEN: Soft, nontender, nondistended. Bowel sounds present. No organomegaly or mass.  EXTREMITIES: Bilateral 2+ pitting edema up to the knees is noted.  NEUROLOGIC: Cranial  nerves II through XII are intact. Muscle strength 5/5 in all extremities. Sensation intact.   PSYCHIATRIC: The patient is alert and oriented x 3.  SKIN: No obvious rash, lesion, or ulcer.   LABORATORY PANEL:   CBC Recent Labs  Lab 11/09/17 2154  WBC 9.2  HGB 13.4  HCT 39.8*  PLT 264  MCV 85.0  MCH 28.7  MCHC 33.7  RDW 15.0*   ------------------------------------------------------------------------------------------------------------------  Chemistries  Recent Labs  Lab 11/09/17 2154  NA 136  K 4.1  CL 106  CO2 22  GLUCOSE 220*  BUN 32*  CREATININE 1.78*  CALCIUM 8.6*   ------------------------------------------------------------------------------------------------------------------ estimated creatinine clearance is 54.4 mL/min (A) (by C-G formula based on SCr of 1.78 mg/dL (H)). ------------------------------------------------------------------------------------------------------------------ No results for input(s): TSH, T4TOTAL, T3FREE, THYROIDAB in the last 72 hours.  Invalid input(s): FREET3   Coagulation profile Recent Labs  Lab 11/09/17 2154  INR 1.05   ------------------------------------------------------------------------------------------------------------------- No results for input(s): DDIMER in the last 72 hours. -------------------------------------------------------------------------------------------------------------------  Cardiac Enzymes Recent Labs  Lab 11/09/17 2154  TROPONINI 0.29*   ------------------------------------------------------------------------------------------------------------------ Invalid input(s): POCBNP  ---------------------------------------------------------------------------------------------------------------  Urinalysis No results found for: COLORURINE, APPEARANCEUR, LABSPEC, PHURINE, GLUCOSEU, HGBUR, BILIRUBINUR, KETONESUR, PROTEINUR, UROBILINOGEN, NITRITE, LEUKOCYTESUR   RADIOLOGY: Dg Chest Portable 1  View  Result Date: 11/09/2017 CLINICAL DATA:  Dyspnea EXAM: PORTABLE CHEST 1 VIEW COMPARISON:  None. FINDINGS: Borderline heart size. No pleural effusion. Moderate diffuse interstitial and ground-glass opacities suspect for edema. No pneumothorax. IMPRESSION: Borderline cardiomegaly. Moderate diffuse interstitial and ground-glass opacity, suspicious for pulmonary edema. Electronically Signed   By: Donavan Foil M.D.   On: 11/09/2017 22:09    EKG: Orders placed or performed during the hospital encounter of 11/09/17  . ED EKG  . ED EKG  . EKG 12-Lead  . EKG 12-Lead    IMPRESSION AND PLAN:  1.  Acute ST elevation MI.  Patient will be taken emergently to cardiac Cath Lab by cardiology for further evaluation.  Continue aspirin and statin therapy. 2.  Acute pulmonary edema, will start treatment with Lasix IV.  Will check 2D echo for further evaluation of the heart function. 3.  Acute respiratory failure with hypoxia, secondary to acute pulmonary edema.  Continue treatment with oxygen and diuretics.  Awaiting cardiac cath for further evaluation of coronary arteries. 4.  Acute renal failure, likely precipitated by acute heart failure.  We will continue to monitor kidney function closely while on diuretic therapy.  Avoid nephrotoxic medications. 5.  Diabetes type 2.  Will monitor blood sugars before meals and at bedtime and use insulin treatment during the hospital stay. 6.  Hypertension, stable.  Will hold lisinopril for now due to elevated creatinine level.   All the records are reviewed and case discussed with ED and cardiology providers. Management plans discussed with the patient, family and they are in agreement.  CODE STATUS: Full    TOTAL TIME TAKING CARE OF THIS PATIENT: 50 minutes.    Amelia Jo M.D on 11/10/2017 at 12:05 AM  Between 7am to 6pm - Pager - [phone]  After 6pm go to [email or web address] - password [email or web address] [facility] [place] [facility]  [phone]  CC: Primary care physician; Venia Carbon, MD

## 2017-11-10 NOTE — Consult Note (Signed)
Reason for Consult: STEMI inferior wall Referring Physician: Dr. Silvio Pate primary emergency room Dr. Kerry Dory Aaron Mckenzie is an 57 y.o. male.  HPI: 57 year old obese white male with multiple medical problems including diabetes hypertension hyperlipidemia leg edema swelling presented with acute onset of dyspnea chest discomfort.  Patient started having symptoms at around 8 to 8:30 PM he finally called rescue at about 1/2-hour to an hour afterwards and then came to the emergency room after EMS picked him up treated with nitro and found to have ST elevation.  Once patient was seen and evaluated emergency room it was determined the patient taken directly to the Cath Lab for possible intention to treat with intervention  Past Medical History:  Diagnosis Date  . Diabetes mellitus   . Ectodermal dysplasia   . Hyperlipidemia   . Hypertension   . Pseudogout   . Type 2 diabetes mellitus with neurological manifestations, uncontrolled ([place]) 04/23/2008   Qualifier: Diagnosis of  By: Silvio Pate MD, [facility]     Past Surgical History:  Procedure Laterality Date  . APPENDECTOMY    . CATARACT EXTRACTION Left 07/2016  . RIB FRACTURE SURGERY  1988   right    Family History  Problem Relation Age of Onset  . Arthritis Mother   . Arthritis Father     Social History:  reports that he has never smoked. He has never used smokeless tobacco. He reports that he does not drink alcohol or use drugs.  Allergies: No Known Allergies  Medications: I have reviewed the patient's current medications.  Results for orders placed or performed during the hospital encounter of 11/09/17 (from the past 48 hour(s))  CBC     Status: Abnormal   Collection Time: 11/09/17  9:54 PM  Result Value Ref Range   WBC 9.2 3.8 - 10.6 K/uL   RBC 4.68 4.40 - 5.90 MIL/uL   Hemoglobin 13.4 13.0 - 18.0 g/dL   HCT 39.8 (L) 40.0 - 52.0 %   MCV 85.0 80.0 - 100.0 fL   MCH 28.7 26.0 - 34.0 pg   MCHC 33.7 32.0 - 36.0 g/dL   RDW 15.0  (H) 11.5 - 14.5 %   Platelets 264 150 - 440 K/uL    Comment: Performed at [facility], [address] [place], [place] [address]  Basic metabolic panel     Status: Abnormal   Collection Time: 11/09/17  9:54 PM  Result Value Ref Range   Sodium 136 135 - 145 mmol/L   Potassium 4.1 3.5 - 5.1 mmol/L   Chloride 106 98 - 111 mmol/L   CO2 22 22 - 32 mmol/L   Glucose, Bld 220 (H) 70 - 99 mg/dL   BUN 32 (H) 6 - 20 mg/dL   Creatinine, Ser 1.78 (H) 0.61 - 1.24 mg/dL   Calcium 8.6 (L) 8.9 - 10.3 mg/dL   GFR calc non Af Amer 40 (L) >60 mL/min   GFR calc Af Amer 46 (L) >60 mL/min    Comment: (NOTE) The eGFR has been calculated using the CKD EPI equation. This calculation has not been validated in all clinical situations. eGFR's persistently <60 mL/min signify possible Chronic Kidney Disease.    Anion gap 8 5 - 15    Comment: Performed at [facility], [address] [place], [place] [address]  Troponin I     Status: Abnormal   Collection Time: 11/09/17  9:54 PM  Result Value Ref Range   Troponin I 0.29 (HH) <0.03 ng/mL  Comment: CRITICAL RESULT CALLED TO, READ BACK BY AND VERIFIED WITH ALISON PATE 11/09/17 2222 JML Performed at [facility], [address] [place], [place] [address]   Brain natriuretic peptide     Status: Abnormal   Collection Time: 11/09/17  9:54 PM  Result Value Ref Range   B Natriuretic Peptide 261.0 (H) 0.0 - 100.0 pg/mL    Comment: Performed at [facility], [address] [place], [place] [address]  Protime-INR     Status: None   Collection Time: 11/09/17  9:54 PM  Result Value Ref Range   Prothrombin Time 13.6 11.4 - 15.2 seconds   INR 1.05     Comment: Performed at [facility], [address] [place], [place] [address]  APTT     Status: None   Collection Time: 11/09/17  9:54 PM  Result Value Ref Range   aPTT 27 24 - 36 seconds    Comment: Performed at [facility], [address]  [place], [place] [address]  POCT Activated clotting time     Status: None   Collection Time: 11/09/17 11:06 PM  Result Value Ref Range   Activated Clotting Time 346 seconds  Glucose, capillary     Status: Abnormal   Collection Time: 11/10/17 12:38 AM  Result Value Ref Range   Glucose-Capillary 232 (H) 70 - 99 mg/dL  MRSA PCR Screening     Status: None   Collection Time: 11/10/17  1:00 AM  Result Value Ref Range   MRSA by PCR NEGATIVE NEGATIVE    Comment:        The GeneXpert MRSA Assay (FDA approved for NASAL specimens only), is one component of a comprehensive MRSA colonization surveillance program. It is not intended to diagnose MRSA infection nor to guide or monitor treatment for MRSA infections. Performed at [facility], [address] [place], [place] [address]   Glucose, capillary     Status: Abnormal   Collection Time: 11/10/17  7:31 AM  Result Value Ref Range   Glucose-Capillary 210 (H) 70 - 99 mg/dL  Basic metabolic panel     Status: Abnormal   Collection Time: 11/10/17  7:39 AM  Result Value Ref Range   Sodium 136 135 - 145 mmol/L   Potassium 4.1 3.5 - 5.1 mmol/L   Chloride 105 98 - 111 mmol/L   CO2 22 22 - 32 mmol/L   Glucose, Bld 213 (H) 70 - 99 mg/dL   BUN 34 (H) 6 - 20 mg/dL   Creatinine, Ser 1.89 (H) 0.61 - 1.24 mg/dL   Calcium 8.1 (L) 8.9 - 10.3 mg/dL   GFR calc non Af Amer 37 (L) >60 mL/min   GFR calc Af Amer 43 (L) >60 mL/min    Comment: (NOTE) The eGFR has been calculated using the CKD EPI equation. This calculation has not been validated in all clinical situations. eGFR's persistently <60 mL/min signify possible Chronic Kidney Disease.    Anion gap 9 5 - 15    Comment: Performed at [facility] [address] [place], [place] [address]  CBC     Status: Abnormal   Collection Time: 11/10/17  7:39 AM  Result Value Ref Range   WBC 8.0 3.8 - 10.6 K/uL   RBC 3.87 (L) 4.40 - 5.90 MIL/uL   Hemoglobin 11.1 (L) 13.0 - 18.0 g/dL   HCT  32.5 (L) 40.0 - 52.0 %   MCV 84.0 80.0 - 100.0 fL   MCH 28.8  26.0 - 34.0 pg   MCHC 34.3 32.0 - 36.0 g/dL   RDW 14.4 11.5 - 14.5 %   Platelets 212 150 - 440 K/uL    Comment: Performed at [facility], [address] [place], [place] [address]  Lipid panel     Status: Abnormal   Collection Time: 11/10/17  7:39 AM  Result Value Ref Range   Cholesterol 155 0 - 200 mg/dL   Triglycerides 223 (H) <150 mg/dL   HDL 19 (L) >40 mg/dL   Total CHOL/HDL Ratio 8.2 RATIO   VLDL 45 (H) 0 - 40 mg/dL   LDL Cholesterol 91 0 - 99 mg/dL    Comment:        Total Cholesterol/HDL:CHD Risk Coronary Heart Disease Risk Table                     Men   Women  1/2 Average Risk   3.4   3.3  Average Risk       5.0   4.4  2 X Average Risk   9.6   7.1  3 X Average Risk  23.4   11.0        Use the calculated Patient Ratio above and the CHD Risk Table to determine the patient's CHD Risk.        ATP III CLASSIFICATION (LDL):  <100     mg/dL   Optimal  100-129  mg/dL   Near or Above                    Optimal  130-159  mg/dL   Borderline  160-189  mg/dL   High  >190     mg/dL   Very High Performed at [facility], [address] [place], [place] [address]   Hemoglobin A1c     Status: Abnormal   Collection Time: 11/10/17  7:39 AM  Result Value Ref Range   Hgb A1c MFr Bld 9.0 (H) 4.8 - 5.6 %    Comment: (NOTE) Pre diabetes:          5.7%-6.4% Diabetes:              >6.4% Glycemic control for   <7.0% adults with diabetes    Mean Plasma Glucose 211.6 mg/dL    Comment: Performed at [facility] [address], [place], [place] [address]  Glucose, capillary     Status: Abnormal   Collection Time: 11/10/17 11:36 AM  Result Value Ref Range   Glucose-Capillary 195 (H) 70 - 99 mg/dL  Troponin I (q 6hr x 3)     Status: Abnormal   Collection Time: 11/10/17  3:43 PM  Result Value Ref Range   Troponin I 34.32 (HH) <0.03 ng/mL    Comment: CRITICAL RESULT CALLED TO, READ BACK BY AND  VERIFIED WITH STEVE IMHOFF 11/10/17 @ 1628  [place] Performed at [facility], [address] [place], [place] [address]   Glucose, capillary     Status: Abnormal   Collection Time: 11/10/17  4:07 PM  Result Value Ref Range   Glucose-Capillary 135 (H) 70 - 99 mg/dL  Glucose, capillary     Status: Abnormal   Collection Time: 11/10/17  8:44 PM  Result Value Ref Range   Glucose-Capillary 184 (H) 70 - 99 mg/dL    Dg Chest Portable 1 View  Result Date: 11/09/2017 CLINICAL DATA:  Dyspnea EXAM: PORTABLE CHEST 1 VIEW COMPARISON:  None. FINDINGS: Borderline heart  size. No pleural effusion. Moderate diffuse interstitial and ground-glass opacities suspect for edema. No pneumothorax. IMPRESSION: Borderline cardiomegaly. Moderate diffuse interstitial and ground-glass opacity, suspicious for pulmonary edema. Electronically Signed   By: Donavan Foil M.Aaron.   On: 11/09/2017 22:09    Review of Systems  Constitutional: Positive for malaise/fatigue.  HENT: Positive for congestion.   Eyes: Negative.   Respiratory: Positive for cough and shortness of breath.   Cardiovascular: Positive for orthopnea, leg swelling and PND.  Gastrointestinal: Negative.   Genitourinary: Negative.   Musculoskeletal: Positive for myalgias.  Skin: Negative.  Negative for itching.  Neurological: Positive for dizziness.  Endo/Heme/Allergies: Negative.   Psychiatric/Behavioral: Negative.    Blood pressure 119/80, pulse 85, temperature 98.6 F (37 C), temperature source Oral, resp. rate 17, height _0  (1.753 m), weight 109.1 kg, SpO2 100 %. Physical Exam  Nursing note and vitals reviewed. Constitutional: He is oriented to person, place, and time. He appears well-developed and well-nourished.  HENT:  Head: Normocephalic and atraumatic.  Eyes: Pupils are equal, round, and reactive to light. Conjunctivae and EOM are normal.  Neck: Normal range of motion. Neck supple.  Cardiovascular: Normal rate and regular rhythm.   Respiratory: He is in respiratory distress. He has decreased breath sounds. He has rhonchi.  GI: Soft. Bowel sounds are normal.  Musculoskeletal: Normal range of motion. He exhibits edema.  Neurological: He is alert and oriented to person, place, and time. He has normal reflexes.  Skin: Rash noted.  Psychiatric: He has a normal mood and affect.    Assessment/Plan: STEMI inferior wall Diabetes type 2 Obesity Hypertension Abnormal EKG Elevated troponin Hyperlipidemia Leg edema Congestive heart failure Dyspnea . Plan Agree with admit Follow-up EKGs and troponins Anticoagulation with heparin Proceed with aspirin Recommend supplemental oxygen therapy Intravenous diuretic therapy with Lasix for heart failure Supplemental inhalers as necessary Proceed with direct cardiac cath for STEMI presentation Consider PCI and stent Continue diabetes management and control Recommend heart failure therapy ACE inhibitor beta-blocker diuretics  Aaron Mckenzie Aaron Mckenzie 11/10/2017, 9:26 PM

## 2017-11-10 NOTE — Consult Note (Signed)
PULMONARY / CRITICAL CARE MEDICINE   NAME:  Aaron Mckenzie, MRN:  [id number], DOB:  1961/02/25, LOS: 0 ADMISSION DATE:  11/09/2017, CONSULTATION DATE: 11/10/2017 REFERRING MD: Dr. Duane Boston. Reason: ST elevation MI, status post left heart catheterization  BRIEF HISTORY:    57 year old male who presented to the ED via EMS with excessive cough, pinkish sputum and diaphoresis.  Ruled in for an ST elevation MI and taken to the Cath Lab.  Found to have 3 vessel disease not amenable to PCI. HISTORY OF PRESENT ILLNESS   This is an 57 year old male with a history of type 2 diabetes, hypertension and hyperlipidemia who presented to the ED with complaints of excessive cough, pink sputum and diaphoresis after cleaning his house.  The house had been treated by "The exterminator".  At the ED, patient had ST elevations in the inferior leads.  He was taken to the Cath Lab and found to have triple-vessel disease not amenable to PCI.  He is being admitted to the ICU postprocedure for close monitoring SIGNIFICANT PAST MEDICAL HISTORY   Type 2 diabetes Hypertension, Hyperlipidemia  SIGNIFICANT EVENTS:  11/10/2017 admitted with ST elevation MI STUDIES:   Left heart catheterization: Final report pending  LINES/TUBES:  Peripheral IV  CONSULTANTS:  Cardiology SUBJECTIVE:  Reports doing well.  Chest pain-free  CONSTITUTIONAL: BP 116/73   Pulse 86   Temp 98.1 F (36.7 C) (Oral)   Resp 15   Ht 5' 9"  (1.753 m)   Wt 112.2 kg   SpO2 94%   BMI 36.53 kg/m   No intake/output data recorded.  PHYSICAL EXAM: General: No acute distress Neuro: Alert and oriented x3, no focal deficits HEENT: PERRLA, trachea midline, no JVD Cardiovascular: Pickup pulse regular, S1-S2, no murmur regurg or gallop, +2 pulses bilaterally, no edema Lungs: Clear to auscultation bilaterally Abdomen: Nondistended, normal bowel sounds in all 4 quadrants Musculoskeletal: No joint deformities, positive range of motion Skin: Warm and  dry   ASSESSMENT   ST elevation MI status post left heart catheterization-triple-vessel disease Type 2 diabetes Hyperlipidemia Hypertension  PLAN  Plan plan per cardiology Blood glucose monitoring with sliding scale insulin coverage Resume all home medications Hemoglobin A1c and lipid panel with a.m. labs SUMMARY OF TODAY'S PLAN:  Monitor in the ICU for 12 to 24 hours and transferred to telemetry  Best Practice / Goals of Care / Disposition.   DVT PROPHYLAXIS: Subcu heparin SUP: Not indicated NUTRITION: Heart healthy diet MOBILITY: Out of bed with assistance and ambulate with assistance  as tolerated GOALS OF CARE: Full treatment with possible referral for CABG FAMILY DISCUSSIONS: Patient and spouse updated at bedside DISPOSITION: Full code  LABS  Glucose Recent Labs  Lab 11/10/17 0038  GLUCAP 232*    BMET Recent Labs  Lab 11/09/17 2154  NA 136  K 4.1  CL 106  CO2 22  BUN 32*  CREATININE 1.78*  GLUCOSE 220*    Liver Enzymes No results for input(s): AST, ALT, ALKPHOS, BILITOT, ALBUMIN in the last 168 hours.  Electrolytes Recent Labs  Lab 11/09/17 2154  CALCIUM 8.6*    CBC Recent Labs  Lab 11/09/17 2154  WBC 9.2  HGB 13.4  HCT 39.8*  PLT 264    ABG No results for input(s): PHART, PCO2ART, PO2ART in the last 168 hours.  Coag's Recent Labs  Lab 11/09/17 2154  APTT 27  INR 1.05    Sepsis Markers No results for input(s): LATICACIDVEN, PROCALCITON, O2SATVEN in the last 168 hours.  Cardiac Enzymes  Recent Labs  Lab 11/09/17 2154  TROPONINI 0.29*    PAST MEDICAL HISTORY :   He  has a past medical history of Diabetes mellitus, Ectodermal dysplasia, Hyperlipidemia, Hypertension, Pseudogout, and Type 2 diabetes mellitus with neurological manifestations, uncontrolled ([place]) (04/23/2008).  PAST SURGICAL HISTORY:  He  has a past surgical history that includes Appendectomy; Rib fracture surgery (1988); and Cataract extraction (Left,  07/2016).  No Known Allergies  No current facility-administered medications on file prior to encounter.    Current Outpatient Medications on File Prior to Encounter  Medication Sig  . ACCU-CHEK SOFTCLIX LANCETS lancets Use as instructed to check blood sugars twice daily.  Marland Kitchen aspirin EC 81 MG tablet Take 81 mg by mouth daily.  . Blood Glucose Monitoring Suppl (ACCU-CHEK GUIDE) w/Device KIT 1 each by Does not apply route 2 (two) times daily.  . brimonidine (ALPHAGAN P) 0.1 % SOLN   . colchicine (COLCRYS) 0.6 MG tablet TAKE 1 TABLET(0.6 MG) BY MOUTH TWICE DAILY  . dorzolamide-timolol (COSOPT) 22.3-6.8 MG/ML ophthalmic solution   . empagliflozin (JARDIANCE) 10 MG TABS tablet Take 10 mg by mouth daily.  . furosemide (LASIX) 20 MG tablet TK 1 T PO QD  . gabapentin (NEURONTIN) 300 MG capsule Take 3 capsules (900 mg total) by mouth 3 (three) times daily.  Marland Kitchen gabapentin (NEURONTIN) 300 MG capsule TAKE 3 CAPSULES BY MOUTH THREE TIMES DAILY  . glucose blood (ACCU-CHEK GUIDE) test strip Use as instructed to check blood sugars twice daily.  . insulin NPH Human (NOVOLIN N RELION) 100 UNIT/ML injection Inject 0.3-0.4 mLs (30-40 Units total) into the skin 2 (two) times daily before a meal.  . insulin regular (NOVOLIN R RELION) 100 units/mL injection Inject 0.3-0.4 mLs (30-40 Units total) into the skin 3 (three) times daily before meals.  . INSULIN SYRINGE .5CC/29G 29G X 1/2" 0.5 ML MISC USE TWICE DAILY  . INSULIN SYRINGE .5CC/29G 29G X 1/2" 0.5 ML MISC USE TWICE DAILY  . lisinopril (PRINIVIL,ZESTRIL) 20 MG tablet TAKE 1 TABLET BY MOUTH EVERY DAY  . pravastatin (PRAVACHOL) 40 MG tablet TAKE 1 TABLET(40 MG) BY MOUTH DAILY  . timolol (TIMOPTIC) 0.5 % ophthalmic solution   . urea (CARMOL) 10 % cream APPLY TOPICALLY DAILY AS NEEDED FOR FEET    FAMILY HISTORY:   His family history includes Arthritis in his father and mother.  SOCIAL HISTORY:  He  reports that he has never smoked. He has never used smokeless  tobacco. He reports that he does not drink alcohol or use drugs.  REVIEW OF SYSTEMS:    Unable to obtain a referral review of system as patient is somnolent.  He denies chest pain and shortness of breath.

## 2017-11-10 NOTE — Progress Notes (Signed)
Received pt from [facility] s/p unsuccessfully attempt for stent placement. Stated pt with 3 vessel diseases. Pt is A/Ox3, lower extremity pulse Doppler  Strong. Pt has 4+pitting edema to bilateral lower legs. Pt on nitro gtt. Pt oriented to room and call light. Will cont to monitor.

## 2017-11-10 NOTE — Plan of Care (Signed)
  Problem: Clinical Measurements: Goal: Ability to maintain clinical measurements within normal limits will improve Outcome: Not Progressing Note:  Patient remains on a Nitro drip for the time being. Awaiting Dr. Etta Quill input as to whether or not the drip will remain. Will continue to monitor. Wenda Low [facility]

## 2017-11-11 LAB — BASIC METABOLIC PANEL
Anion gap: 8 (ref 5–15)
BUN: 37 mg/dL — ABNORMAL HIGH (ref 6–20)
CALCIUM: 8.1 mg/dL — AB (ref 8.9–10.3)
CO2: 22 mmol/L (ref 22–32)
CREATININE: 2.59 mg/dL — AB (ref 0.61–1.24)
Chloride: 106 mmol/L (ref 98–111)
GFR calc non Af Amer: 25 mL/min — ABNORMAL LOW (ref >60)
GFR, EST AFRICAN AMERICAN: 30 mL/min — AB (ref >60)
Glucose, Bld: 154 mg/dL — ABNORMAL HIGH (ref 70–99)
Potassium: 3.9 mmol/L (ref 3.5–5.1)
SODIUM: 136 mmol/L (ref 135–145)

## 2017-11-11 LAB — GLUCOSE, CAPILLARY
GLUCOSE-CAPILLARY: 158 mg/dL — AB (ref 70–99)
GLUCOSE-CAPILLARY: 150 mg/dL — AB (ref 70–99)
Glucose-Capillary: 181 mg/dL — ABNORMAL HIGH (ref 70–99)
Glucose-Capillary: 161 mg/dL — ABNORMAL HIGH (ref 70–99)

## 2017-11-11 LAB — TROPONIN I: Troponin I: 22.41 ng/mL (ref <0.03)

## 2017-11-11 LAB — ECHOCARDIOGRAM COMPLETE
HEIGHTINCHES: 69 in
WEIGHTICAEL: 3957.7 [oz_av]

## 2017-11-11 MED ORDER — CARVEDILOL 3.125 MG PO TABS
3.1250 mg | ORAL_TABLET | Freq: Two times a day (BID) | ORAL | Status: DC
  Administered 2017-11-11 – 2017-11-14 (×6): 3.125 mg via ORAL
  Filled 2017-11-11 (×7): qty 1

## 2017-11-11 NOTE — Plan of Care (Signed)
  Problem: Clinical Measurements: Goal: Ability to maintain clinical measurements within normal limits will improve Outcome: Progressing Goal: Cardiovascular complication will be avoided Outcome: Progressing   Problem: Activity: Goal: Risk for activity intolerance will decrease Outcome: Progressing   Problem: Education: Goal: Understanding of medication regimen will improve Outcome: Progressing   Problem: Cardiac: Goal: Ability to achieve and maintain adequate cardiopulmonary perfusion will improve Outcome: Progressing Goal: Vascular access site(s) Level 0-1 will be maintained Outcome: Progressing

## 2017-11-11 NOTE — Progress Notes (Addendum)
[place] NAME: Paolo Okane    MR#:  [id number]  DATE OF BIRTH:  [birth year]  SUBJECTIVE:  CHIEF COMPLAINT:   Chief Complaint  Patient presents with  . Code STEMI   Code STEMI activated. Cath done- noted to have tripple vessel disease, no stent placed. No pain now.  REVIEW OF SYSTEMS:  CONSTITUTIONAL: No fever, fatigue or weakness.  EYES: No blurred or double vision.  EARS, NOSE, AND THROAT: No tinnitus or ear pain.  RESPIRATORY: No cough, shortness of breath, wheezing or hemoptysis.  CARDIOVASCULAR: No chest pain, orthopnea, edema.  GASTROINTESTINAL: No nausea, vomiting, diarrhea or abdominal pain.  GENITOURINARY: No dysuria, hematuria.  ENDOCRINE: No polyuria, nocturia,  HEMATOLOGY: No anemia, easy bruising or bleeding SKIN: No rash or lesion. MUSCULOSKELETAL: No joint pain or arthritis.   NEUROLOGIC: No tingling, numbness, weakness.  PSYCHIATRY: No anxiety or depression.   ROS  DRUG ALLERGIES:  No Known Allergies  VITALS:  Blood pressure 118/75, pulse 92, temperature 98.3 F (36.8 C), temperature source Oral, resp. rate 17, height 5\' 9"  (1.753 m), weight 110.2 kg, SpO2 95 %.  PHYSICAL EXAMINATION:  GENERAL:  57 y.o.-year-old patient lying in the bed with no acute distress.  EYES: Pupils equal, round, reactive to light and accommodation. No scleral icterus. Extraocular muscles intact.  HEENT: Head atraumatic, normocephalic. Oropharynx and nasopharynx clear.  NECK:  Supple, no jugular venous distention. No thyroid enlargement, no tenderness.  LUNGS: Normal breath sounds bilaterally, no wheezing, rales,rhonchi or crepitation. No use of accessory muscles of respiration.  CARDIOVASCULAR: S1, S2 normal. No murmurs, rubs, or gallops.  ABDOMEN: Soft, nontender, nondistended. Bowel sounds present. No organomegaly or mass.  EXTREMITIES: No pedal edema, cyanosis, or clubbing.  NEUROLOGIC: Cranial nerves II through XII are  intact. Muscle strength 5/5 in all extremities. Sensation intact. Gait not checked.  PSYCHIATRIC: The patient is alert and oriented x 3.  SKIN: No obvious rash, lesion, or ulcer.   Physical Exam LABORATORY PANEL:   CBC Recent Labs  Lab 11/10/17 0739  WBC 8.0  HGB 11.1*  HCT 32.5*  PLT 212   ------------------------------------------------------------------------------------------------------------------  Chemistries  Recent Labs  Lab 11/11/17 0427  NA 136  K 3.9  CL 106  CO2 22  GLUCOSE 154*  BUN 37*  CREATININE 2.59*  CALCIUM 8.1*   ------------------------------------------------------------------------------------------------------------------  Cardiac Enzymes Recent Labs  Lab 11/10/17 2133 11/11/17 0427  TROPONINI 26.14* 22.41*   ------------------------------------------------------------------------------------------------------------------  RADIOLOGY:  Dg Chest Portable 1 View  Result Date: 11/09/2017 CLINICAL DATA:  Dyspnea EXAM: PORTABLE CHEST 1 VIEW COMPARISON:  None. FINDINGS: Borderline heart size. No pleural effusion. Moderate diffuse interstitial and ground-glass opacities suspect for edema. No pneumothorax. IMPRESSION: Borderline cardiomegaly. Moderate diffuse interstitial and ground-glass opacity, suspicious for pulmonary edema. Electronically Signed   By: Donavan Foil M.D.   On: 11/09/2017 22:09    ASSESSMENT AND PLAN:   Active Problems:   STEMI (ST elevation myocardial infarction) ([place])  1.  Acute ST elevation MI.    s/p cardiac cath- tripple vessel disease.  Continue aspirin and statin therapy.  Added coreg, BP is borderline, may not be able to add lisinopril. 2.  Acute pulmonary edema, will start treatment with Lasix IV.  Will check 2D echo for further evaluation of the heart function. 3.  Acute respiratory failure with hypoxia, secondary to acute pulmonary edema.  Continue treatment with oxygen and diuretics.  Awaiting cardiac cath for  further evaluation of coronary arteries. 4.  Acute renal failure on CKD stage 3, likely precipitated by acute heart failure.    continue to monitor kidney function closely while on diuretic therapy.  Avoid nephrotoxic medications.  Also had cardiac cath done. 5.  Diabetes type 2.  Will monitor blood sugars before meals and at bedtime and use insulin treatment during the hospital stay. 6.  Hypertension, stable.   hold lisinopril for now due to elevated creatinine level.   All the records are reviewed and case discussed with Care Management/Social Workerr. Management plans discussed with the patient, family and they are in agreement.  CODE STATUS: Full.  TOTAL TIME TAKING CARE OF THIS PATIENT: 35 minutes.  Wife and brother in law in the room.   POSSIBLE D/C IN 1-2 DAYS, DEPENDING ON CLINICAL CONDITION.   Vaughan Basta M.D on 11/11/2017   Between 7am to 6pm - Pager - [phone]  After 6pm go to [email or web address] - password [email or web address] [facility]  Office  [phone]  CC: Primary care physician; Venia Carbon, MD  Note: This dictation was prepared with Dragon dictation along with smaller phrase technology. Any transcriptional errors that result from this process are unintentional.

## 2017-11-11 NOTE — Progress Notes (Signed)
[facility] [place] NAME: Aaron Mckenzie    MR#:  [id number]  DATE OF BIRTH:  1960-08-07  SUBJECTIVE:  CHIEF COMPLAINT:   Chief Complaint  Patient presents with  . Code STEMI   Code STEMI activated. Cath done- noted to have tripple vessel disease, no stent placed. No pain now. Renal func slightly worse.  REVIEW OF SYSTEMS:  CONSTITUTIONAL: No fever, fatigue or weakness.  EYES: No blurred or double vision.  EARS, NOSE, AND THROAT: No tinnitus or ear pain.  RESPIRATORY: No cough, shortness of breath, wheezing or hemoptysis.  CARDIOVASCULAR: No chest pain, orthopnea, edema.  GASTROINTESTINAL: No nausea, vomiting, diarrhea or abdominal pain.  GENITOURINARY: No dysuria, hematuria.  ENDOCRINE: No polyuria, nocturia,  HEMATOLOGY: No anemia, easy bruising or bleeding SKIN: No rash or lesion. MUSCULOSKELETAL: No joint pain or arthritis.   NEUROLOGIC: No tingling, numbness, weakness.  PSYCHIATRY: No anxiety or depression.   ROS  DRUG ALLERGIES:  No Known Allergies  VITALS:  Blood pressure 134/89, pulse 78, temperature 98.7 F (37.1 C), temperature source Oral, resp. rate 18, height 5\' 9"  (1.753 m), weight 110.2 kg, SpO2 97 %.  PHYSICAL EXAMINATION:  GENERAL:  57 y.o.-year-old patient lying in the bed with no acute distress.  EYES: Pupils equal, round, reactive to light and accommodation. No scleral icterus. Extraocular muscles intact.  HEENT: Head atraumatic, normocephalic. Oropharynx and nasopharynx clear.  NECK:  Supple, no jugular venous distention. No thyroid enlargement, no tenderness.  LUNGS: Normal breath sounds bilaterally, no wheezing, rales,rhonchi or crepitation. No use of accessory muscles of respiration.  CARDIOVASCULAR: S1, S2 normal. No murmurs, rubs, or gallops.  ABDOMEN: Soft, nontender, nondistended. Bowel sounds present. No organomegaly or mass.  EXTREMITIES: No pedal edema, cyanosis, or clubbing.  NEUROLOGIC: Cranial  nerves II through XII are intact. Muscle strength 5/5 in all extremities. Sensation intact. Gait not checked.  PSYCHIATRIC: The patient is alert and oriented x 3.  SKIN: No obvious rash, lesion, or ulcer.   Physical Exam LABORATORY PANEL:   CBC Recent Labs  Lab 11/10/17 0739  WBC 8.0  HGB 11.1*  HCT 32.5*  PLT 212   ------------------------------------------------------------------------------------------------------------------  Chemistries  Recent Labs  Lab 11/11/17 0427  NA 136  K 3.9  CL 106  CO2 22  GLUCOSE 154*  BUN 37*  CREATININE 2.59*  CALCIUM 8.1*   ------------------------------------------------------------------------------------------------------------------  Cardiac Enzymes Recent Labs  Lab 11/10/17 2133 11/11/17 0427  TROPONINI 26.14* 22.41*   ------------------------------------------------------------------------------------------------------------------  RADIOLOGY:  No results found.  ASSESSMENT AND PLAN:   Active Problems:   STEMI (ST elevation myocardial infarction) ([place])  1.  Acute ST elevation MI.    s/p cardiac cath- tripple vessel disease.  Continue aspirin and statin therapy.  Added coreg, BP is borderline, may not be able to add lisinopril. 2.  Acute pulmonary edema, will start treatment with Lasix IV.  Will check 2D echo for further evaluation of the heart function. 3.  Acute respiratory failure with hypoxia, secondary to acute pulmonary edema.  Continue treatment with oxygen and diuretics.  Awaiting cardiac cath for further evaluation of coronary arteries. 4.  Acute renal failure on CKD stage 3, likely precipitated by acute heart failure.    continue to monitor kidney function closely while on diuretic therapy.  Avoid nephrotoxic medications.  Also had cardiac cath done. 5.  Diabetes type 2.  Will monitor blood sugars before meals and at bedtime and use insulin treatment during the hospital stay. 6.  Hypertension, stable.    hold lisinopril for now due to elevated creatinine level.   All the records are reviewed and case discussed with Care Management/Social Workerr. Management plans discussed with the patient, family and they are in agreement.  CODE STATUS: Full.  TOTAL TIME TAKING CARE OF THIS PATIENT: 35 minutes.  Wife and brother in law in the room.   POSSIBLE D/C IN 1-2 DAYS, DEPENDING ON CLINICAL CONDITION.   Vaughan Basta M.D on 11/11/2017   Between 7am to 6pm - Pager - [phone]  After 6pm go to [email or web address] - password [email or web address] [facility]  Office  [phone]  CC: Primary care physician; Venia Carbon, MD  Note: This dictation was prepared with Dragon dictation along with smaller phrase technology. Any transcriptional errors that result from this process are unintentional.

## 2017-11-12 ENCOUNTER — Other Ambulatory Visit: Payer: Self-pay | Admitting: Internal Medicine

## 2017-11-12 LAB — GLUCOSE, CAPILLARY
GLUCOSE-CAPILLARY: 113 mg/dL — AB (ref 70–99)
Glucose-Capillary: 172 mg/dL — ABNORMAL HIGH (ref 70–99)
Glucose-Capillary: 134 mg/dL — ABNORMAL HIGH (ref 70–99)
Glucose-Capillary: 160 mg/dL — ABNORMAL HIGH (ref 70–99)

## 2017-11-12 LAB — BASIC METABOLIC PANEL
ANION GAP: 9 (ref 5–15)
BUN: 53 mg/dL — ABNORMAL HIGH (ref 6–20)
CO2: 22 mmol/L (ref 22–32)
Calcium: 8.2 mg/dL — ABNORMAL LOW (ref 8.9–10.3)
Chloride: 100 mmol/L (ref 98–111)
Creatinine, Ser: 3.75 mg/dL — ABNORMAL HIGH (ref 0.61–1.24)
GFR calc Af Amer: 19 mL/min — ABNORMAL LOW (ref >60)
GFR, EST NON AFRICAN AMERICAN: 16 mL/min — AB (ref >60)
GLUCOSE: 122 mg/dL — AB (ref 70–99)
POTASSIUM: 4 mmol/L (ref 3.5–5.1)
SODIUM: 131 mmol/L — AB (ref 135–145)

## 2017-11-12 LAB — HIV ANTIBODY (ROUTINE TESTING W REFLEX): HIV Screen 4th Generation wRfx: NONREACTIVE

## 2017-11-12 MED ORDER — SODIUM CHLORIDE 0.9 % IV SOLN
INTRAVENOUS | Status: DC
  Administered 2017-11-12: 10:00:00 via INTRAVENOUS

## 2017-11-12 MED ORDER — DORZOLAMIDE HCL-TIMOLOL MAL 2-0.5 % OP SOLN
1.0000 [drp] | Freq: Two times a day (BID) | OPHTHALMIC | Status: DC
  Administered 2017-11-12 – 2017-11-14 (×4): 1 [drp] via OPHTHALMIC
  Filled 2017-11-12: qty 10

## 2017-11-12 MED ORDER — CLOPIDOGREL BISULFATE 75 MG PO TABS
75.0000 mg | ORAL_TABLET | Freq: Every day | ORAL | Status: DC
  Administered 2017-11-12 – 2017-11-14 (×3): 75 mg via ORAL
  Filled 2017-11-12 (×3): qty 1

## 2017-11-12 NOTE — Progress Notes (Signed)
[facility] [place] NAME: Aaron Mckenzie    MR#:  [id number]  DATE OF BIRTH:  November 22, 1960  SUBJECTIVE:  CHIEF COMPLAINT:   Chief Complaint  Patient presents with  . Code STEMI   Code STEMI activated. Cath done- noted to have tripple vessel disease, no stent placed. No pain now. Renal func continue to be worsening. Patient has no complaints overall.  REVIEW OF SYSTEMS:  CONSTITUTIONAL: No fever, fatigue or weakness.  EYES: No blurred or double vision.  EARS, NOSE, AND THROAT: No tinnitus or ear pain.  RESPIRATORY: No cough, shortness of breath, wheezing or hemoptysis.  CARDIOVASCULAR: No chest pain, orthopnea, edema.  GASTROINTESTINAL: No nausea, vomiting, diarrhea or abdominal pain.  GENITOURINARY: No dysuria, hematuria.  ENDOCRINE: No polyuria, nocturia,  HEMATOLOGY: No anemia, easy bruising or bleeding SKIN: No rash or lesion. MUSCULOSKELETAL: No joint pain or arthritis.   NEUROLOGIC: No tingling, numbness, weakness.  PSYCHIATRY: No anxiety or depression.   ROS  DRUG ALLERGIES:  No Known Allergies  VITALS:  Blood pressure 123/74, pulse 74, temperature (!) 97.5 F (36.4 C), temperature source Oral, resp. rate 18, height 5\' 9"  (1.753 m), weight 112 kg, SpO2 97 %.  PHYSICAL EXAMINATION:  GENERAL:  57 y.o.-year-old patient lying in the bed with no acute distress.  EYES: Pupils equal, round, reactive to light and accommodation. No scleral icterus. Extraocular muscles intact.  HEENT: Head atraumatic, normocephalic. Oropharynx and nasopharynx clear.  NECK:  Supple, no jugular venous distention. No thyroid enlargement, no tenderness.  LUNGS: Normal breath sounds bilaterally, no wheezing, rales,rhonchi or crepitation. No use of accessory muscles of respiration.  CARDIOVASCULAR: S1, S2 normal. No murmurs, rubs, or gallops.  ABDOMEN: Soft, nontender, nondistended. Bowel sounds present. No organomegaly or mass.  EXTREMITIES: No pedal  edema, cyanosis, or clubbing.  NEUROLOGIC: Cranial nerves II through XII are intact. Muscle strength 5/5 in all extremities. Sensation intact. Gait not checked.  PSYCHIATRIC: The patient is alert and oriented x 3.  SKIN: No obvious rash, lesion, or ulcer.   Physical Exam LABORATORY PANEL:   CBC Recent Labs  Lab 11/10/17 0739  WBC 8.0  HGB 11.1*  HCT 32.5*  PLT 212   ------------------------------------------------------------------------------------------------------------------  Chemistries  Recent Labs  Lab 11/12/17 0354  NA 131*  K 4.0  CL 100  CO2 22  GLUCOSE 122*  BUN 53*  CREATININE 3.75*  CALCIUM 8.2*   ------------------------------------------------------------------------------------------------------------------  Cardiac Enzymes Recent Labs  Lab 11/10/17 2133 11/11/17 0427  TROPONINI 26.14* 22.41*   ------------------------------------------------------------------------------------------------------------------  RADIOLOGY:  No results found.  ASSESSMENT AND PLAN:   Active Problems:   STEMI (ST elevation myocardial infarction) ([place])  1.  Acute ST elevation MI.    s/p cardiac cath- tripple vessel disease.  Continue aspirin , plavix and statin therapy.  Added coreg, BP is borderline, may not be able to add lisinopril due to renal failure. 2.  Acute pulmonary edema, ac systolic CHF  Given Lasix IV.   check 2D echo for further evaluation of the heart function.  Stopped lasix now with renal failure. 3.  Acute respiratory failure with hypoxia, secondary to acute pulmonary edema.  Continue treatment with oxygen and diuretics.  Awaiting cardiac cath for further evaluation of coronary arteries. 4.  Acute renal failure on CKD stage 3, likely precipitated by acute heart failure and cath.  continue to monitor kidney function closely while on diuretic therapy.  Avoid nephrotoxic medications.  Also had cardiac cath done.  IV fluids  and nephro consult as cont  to worse. 5.  Diabetes type 2.  Will monitor blood sugars before meals and at bedtime and use insulin treatment during the hospital stay. 6.  Hypertension, stable.   hold lisinopril for now due to elevated creatinine level.   All the records are reviewed and case discussed with Care Management/Social Workerr. Management plans discussed with the patient, family and they are in agreement.  CODE STATUS: Full.  TOTAL TIME TAKING CARE OF THIS PATIENT: 35 minutes.  Wife in the room.   POSSIBLE D/C IN 1-2 DAYS, DEPENDING ON CLINICAL CONDITION.   Vaughan Basta M.D on 11/12/2017   Between 7am to 6pm - Pager - [phone]  After 6pm go to [email or web address] - password [email or web address] [facility]  Office  [phone]  CC: Primary care physician; Venia Carbon, MD  Note: This dictation was prepared with Dragon dictation along with smaller phrase technology. Any transcriptional errors that result from this process are unintentional.

## 2017-11-12 NOTE — Progress Notes (Signed)
[facility]  ROUNDING NOTE   Subjective:   Aaron Mckenzie admitted to [facility] on 11/09/2017 for Acute respiratory distress [R06.03] ST elevation myocardial infarction (STEMI), unspecified artery ([place]) [I21.3] STEMI (ST elevation myocardial infarction) ([place]) [I21.3]  Patient's wife at bedside. Cardiac catheterization on 9/13. Creatinine on admission of 1.78  Today is 3.75 and sodium dropped. Nephrology consulted.   Patient states he is well and only has peripheral edema.   Objective:  Vital signs in last 24 hours:  Temp:  [97.5 F (36.4 C)-98.7 F (37.1 C)] 97.5 F (36.4 C) (09/16 0746) Pulse Rate:  [74-79] 74 (09/16 0746) Resp:  [18] 18 (09/16 0346) BP: (113-134)/(71-89) 123/74 (09/16 0746) SpO2:  [94 %-99 %] 97 % (09/16 0746) Weight:  [161 kg] 112 kg (09/16 0346)  Weight change: 2.903 kg Filed Weights   11/10/17 1700 11/11/17 0347 11/12/17 0346  Weight: 109.1 kg 110.2 kg 112 kg    Intake/Output: I/O last 3 completed shifts: In: 480 [P.O.:480] Out: 870 [Urine:870]   Intake/Output this shift:  No intake/output data recorded.  Physical Exam: General: NAD, laying in bed  Head: Normocephalic, atraumatic. Moist oral mucosal membranes  Eyes: Anicteric, PERRL  Neck: Supple, trachea midline  Lungs:  Clear to auscultation  Heart: Regular rate and rhythm  Abdomen:  Soft, nontender,   Extremities: 1+ peripheral edema.  Neurologic: Nonfocal, moving all four extremities  Skin: No lesions        Basic Metabolic Panel: Recent Labs  Lab 11/09/17 2154 11/10/17 0739 11/11/17 0427 11/12/17 0354  NA 136 136 136 131*  K 4.1 4.1 3.9 4.0  CL 106 105 106 100  CO2 22 22 22 22   GLUCOSE 220* 213* 154* 122*  BUN 32* 34* 37* 53*  CREATININE 1.78* 1.89* 2.59* 3.75*  CALCIUM 8.6* 8.1* 8.1* 8.2*    Liver Function Tests: No results for input(s): AST, ALT, ALKPHOS, BILITOT, PROT, ALBUMIN in the last 168 hours. No results for input(s): LIPASE, AMYLASE in the  last 168 hours. No results for input(s): AMMONIA in the last 168 hours.  CBC: Recent Labs  Lab 11/09/17 06/06/52 11/10/17 0739  WBC 9.2 8.0  HGB 13.4 11.1*  HCT 39.8* 32.5*  MCV 85.0 84.0  PLT 264 212    Cardiac Enzymes: Recent Labs  Lab 11/09/17 2154 11/10/17 1543 11/10/17 2133 11/11/17 0427  TROPONINI 0.29* 34.32* 26.14* 22.41*    BNP: Invalid input(s): POCBNP  CBG: Recent Labs  Lab 11/11/17 1122 11/11/17 1640 11/11/17 2025 11/12/17 0748 11/12/17 1138  GLUCAP 181* 161* 150* 113* 172*    Microbiology: Results for orders placed or performed during the hospital encounter of 11/09/17  MRSA PCR Screening     Status: None   Collection Time: 11/10/17  1:00 AM  Result Value Ref Range Status   MRSA by PCR NEGATIVE NEGATIVE Final    Comment:        The GeneXpert MRSA Assay (FDA approved for NASAL specimens only), is one component of a comprehensive MRSA colonization surveillance program. It is not intended to diagnose MRSA infection nor to guide or monitor treatment for MRSA infections. Performed at [facility], [address] [place], [place] [address]     Coagulation Studies: Recent Labs    11/09/17 June 06, 2152  LABPROT 13.6  INR 1.05    Urinalysis: No results for input(s): COLORURINE, LABSPEC, PHURINE, GLUCOSEU, HGBUR, BILIRUBINUR, KETONESUR, PROTEINUR, UROBILINOGEN, NITRITE, LEUKOCYTESUR in the last 72 hours.  Invalid input(s): APPERANCEUR    Imaging: No  results found.   Medications:   . sodium chloride    . sodium chloride 75 mL/hr at 11/12/17 1024   . aspirin EC  325 mg Oral Daily  . atorvastatin  80 mg Oral q1800  . Besifloxacin HCl  1 drop Right Eye BID  . brimonidine  1 drop Right Eye BID  . carvedilol  3.125 mg Oral BID WC  . docusate sodium  100 mg Oral BID  . heparin  5,000 Units Subcutaneous Q8H  . insulin aspart  0-15 Units Subcutaneous TID WC  . insulin aspart  0-5 Units Subcutaneous QHS  . insulin NPH Human  25 Units  Subcutaneous BID AC & HS  . nepafenac  1 drop Right Eye Daily  . prednisoLONE acetate  1 drop Right Eye QID  . sodium chloride flush  3 mL Intravenous Q12H  . sodium chloride flush  3 mL Intravenous Q12H   sodium chloride, acetaminophen **OR** acetaminophen, acetaminophen, bisacodyl, HYDROcodone-acetaminophen, ondansetron **OR** ondansetron (ZOFRAN) IV, sodium chloride flush  Assessment/ Plan:  Aaron Mckenzie is a 57 y.o. 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  Acute renal failure secondary to contrast induced nephropathy. Complicated with hyponatremia - Holding lisinopril and furosemide - Start IV fluids  2. Hypertension: at goal 123/74 - carvedilol. Holding lisinopril and furosemide  3. Acute coronary artery disease: appreciate cardiology input.  Status post cardiac catheterization on 9/13.    LOS: 2 Shalandria Elsbernd 9/16/20192:37 PM

## 2017-11-12 NOTE — Care Management (Signed)
Admitted with SOB and cough.  CAth performed on 9-15 and noted triple vessel disease.  No stent placed. Patient is in ARF related to contrast.  Stopped lasix and is on IVF.  Possible D/C tomorrow and will follow up as out patient for cardiac cath.  Lives with wife.  Denies difficulty affording medications.  No transportation issue.  Current with PCP.  On room air and no services in the home.  Patient has a walker at home that he rarely uses for his diabetic neuropathy.

## 2017-11-12 NOTE — Plan of Care (Signed)

## 2017-11-13 ENCOUNTER — Encounter: Payer: Self-pay | Admitting: Internal Medicine

## 2017-11-13 LAB — BASIC METABOLIC PANEL
ANION GAP: 8 (ref 5–15)
BUN: 55 mg/dL — ABNORMAL HIGH (ref 6–20)
CHLORIDE: 103 mmol/L (ref 98–111)
CO2: 20 mmol/L — AB (ref 22–32)
CREATININE: 3.54 mg/dL — AB (ref 0.61–1.24)
Calcium: 8 mg/dL — ABNORMAL LOW (ref 8.9–10.3)
GFR calc non Af Amer: 17 mL/min — ABNORMAL LOW (ref >60)
GFR, EST AFRICAN AMERICAN: 20 mL/min — AB (ref >60)
GLUCOSE: 101 mg/dL — AB (ref 70–99)
Potassium: 4.3 mmol/L (ref 3.5–5.1)
Sodium: 131 mmol/L — ABNORMAL LOW (ref 135–145)

## 2017-11-13 LAB — GLUCOSE, CAPILLARY
GLUCOSE-CAPILLARY: 152 mg/dL — AB (ref 70–99)
GLUCOSE-CAPILLARY: 129 mg/dL — AB (ref 70–99)
GLUCOSE-CAPILLARY: 155 mg/dL — AB (ref 70–99)
Glucose-Capillary: 190 mg/dL — ABNORMAL HIGH (ref 70–99)

## 2017-11-13 LAB — CBC
HCT: 30.7 % — ABNORMAL LOW (ref 40.0–52.0)
HEMOGLOBIN: 10.6 g/dL — AB (ref 13.0–18.0)
MCH: 29.2 pg (ref 26.0–34.0)
MCHC: 34.5 g/dL (ref 32.0–36.0)
MCV: 84.7 fL (ref 80.0–100.0)
PLATELETS: 212 10*3/uL (ref 150–440)
RBC: 3.62 MIL/uL — AB (ref 4.40–5.90)
RDW: 14.5 % (ref 11.5–14.5)
WBC: 5.8 10*3/uL (ref 3.8–10.6)

## 2017-11-13 MED ORDER — FUROSEMIDE 10 MG/ML IJ SOLN
80.0000 mg | Freq: Once | INTRAMUSCULAR | Status: AC
  Administered 2017-11-13: 80 mg via INTRAVENOUS
  Filled 2017-11-13: qty 8

## 2017-11-13 MED ORDER — INSULIN DETEMIR 100 UNIT/ML ~~LOC~~ SOLN
20.0000 [IU] | Freq: Every day | SUBCUTANEOUS | Status: DC
  Administered 2017-11-13: 20 [IU] via SUBCUTANEOUS
  Filled 2017-11-13 (×2): qty 0.2

## 2017-11-13 MED ORDER — INSULIN DETEMIR 100 UNIT/ML ~~LOC~~ SOLN
15.0000 [IU] | Freq: Every day | SUBCUTANEOUS | Status: DC
  Administered 2017-11-14: 15 [IU] via SUBCUTANEOUS
  Filled 2017-11-13: qty 0.15

## 2017-11-13 NOTE — Progress Notes (Signed)
[facility]  ROUNDING NOTE   Subjective:   Wife at bedside.   Complains of increasing abdominal girth and peripheral edema.   Objective:  Vital signs in last 24 hours:  Temp:  [97.6 F (36.4 C)-98.4 F (36.9 C)] 98.4 F (36.9 C) (09/17 0912) Pulse Rate:  [75-79] 79 (09/17 0912) Resp:  [18] 18 (09/17 0322) BP: (119-140)/(75-88) 119/78 (09/17 0912) SpO2:  [97 %-100 %] 97 % (09/17 0912) Weight:  [113.6 kg] 113.6 kg (09/17 0322)  Weight change: 1.542 kg Filed Weights   11/11/17 0347 11/12/17 0346 11/13/17 0322  Weight: 110.2 kg 112 kg 113.6 kg    Intake/Output: I/O last 3 completed shifts: In: 1238.7 [I.V.:1238.7] Out: 0    Intake/Output this shift:  No intake/output data recorded.  Physical Exam: General: NAD, laying in bed  Head: Normocephalic, atraumatic. Moist oral mucosal membranes  Eyes: Anicteric, PERRL  Neck: Supple, trachea midline  Lungs:  Clear to auscultation  Heart: Regular rate and rhythm  Abdomen:  Soft, nontender, mild distension  Extremities: 1+ peripheral edema.  Neurologic: Nonfocal, moving all four extremities  Skin: No lesions        Basic Metabolic Panel: Recent Labs  Lab 11/09/17 2154 11/10/17 0739 11/11/17 0427 11/12/17 0354 11/13/17 0333  NA 136 136 136 131* 131*  K 4.1 4.1 3.9 4.0 4.3  CL 106 105 106 100 103  CO2 22 22 22 22  20*  GLUCOSE 220* 213* 154* 122* 101*  BUN 32* 34* 37* 53* 55*  CREATININE 1.78* 1.89* 2.59* 3.75* 3.54*  CALCIUM 8.6* 8.1* 8.1* 8.2* 8.0*    Liver Function Tests: No results for input(s): AST, ALT, ALKPHOS, BILITOT, PROT, ALBUMIN in the last 168 hours. No results for input(s): LIPASE, AMYLASE in the last 168 hours. No results for input(s): AMMONIA in the last 168 hours.  CBC: Recent Labs  Lab 11/09/17 2154 11/10/17 0739 11/13/17 0333  WBC 9.2 8.0 5.8  HGB 13.4 11.1* 10.6*  HCT 39.8* 32.5* 30.7*  MCV 85.0 84.0 84.7  PLT 264 212 212    Cardiac Enzymes: Recent Labs  Lab  11/09/17 2154 11/10/17 1543 11/10/17 2133 11/11/17 0427  TROPONINI 0.29* 34.32* 26.14* 22.41*    BNP: Invalid input(s): POCBNP  CBG: Recent Labs  Lab 11/12/17 1138 11/12/17 1741 11/12/17 2040 11/13/17 0913 11/13/17 1200  GLUCAP 172* 134* 160* 152* 129*    Microbiology: Results for orders placed or performed during the hospital encounter of 11/09/17  MRSA PCR Screening     Status: None   Collection Time: 11/10/17  1:00 AM  Result Value Ref Range Status   MRSA by PCR NEGATIVE NEGATIVE Final    Comment:        The GeneXpert MRSA Assay (FDA approved for NASAL specimens only), is one component of a comprehensive MRSA colonization surveillance program. It is not intended to diagnose MRSA infection nor to guide or monitor treatment for MRSA infections. Performed at [facility], [address] [place], [place] [address]     Coagulation Studies: No results for input(s): LABPROT, INR in the last 72 hours.  Urinalysis: No results for input(s): COLORURINE, LABSPEC, PHURINE, GLUCOSEU, HGBUR, BILIRUBINUR, KETONESUR, PROTEINUR, UROBILINOGEN, NITRITE, LEUKOCYTESUR in the last 72 hours.  Invalid input(s): APPERANCEUR    Imaging: No results found.   Medications:   . sodium chloride     . aspirin EC  325 mg Oral Daily  . atorvastatin  80 mg Oral q1800  . brimonidine  1 drop Right Eye  BID  . carvedilol  3.125 mg Oral BID WC  . clopidogrel  75 mg Oral Daily  . docusate sodium  100 mg Oral BID  . dorzolamide-timolol  1 drop Both Eyes BID  . heparin  5,000 Units Subcutaneous Q8H  . insulin aspart  0-15 Units Subcutaneous TID WC  . insulin aspart  0-5 Units Subcutaneous QHS  . insulin detemir  20 Units Subcutaneous QHS   And  . [START ON 11/14/2017] insulin detemir  15 Units Subcutaneous Daily  . sodium chloride flush  3 mL Intravenous Q12H  . sodium chloride flush  3 mL Intravenous Q12H   sodium chloride, acetaminophen **OR** acetaminophen,  acetaminophen, bisacodyl, HYDROcodone-acetaminophen, ondansetron **OR** ondansetron (ZOFRAN) IV, sodium chloride flush  Assessment/ Plan:  [name] is a 57 y.o. 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  Acute renal failure secondary to contrast induced nephropathy. Complicated with hyponatremia - Holding lisinopril  - Discontinue IV fluids - IV furosemide 80mg  x 1  2. Hypertension:   - IV furosemide as above.  - carvedilol. Holding lisinopril    3. Acute coronary artery disease: appreciate cardiology input.  Status post cardiac catheterization on 9/13.    LOS: 3 Aaron Mckenzie 9/17/20193:11 PM

## 2017-11-13 NOTE — Care Management Important Message (Signed)
Copy of signed IM left with patient in room.  

## 2017-11-13 NOTE — Care Management Note (Signed)
Case Management Note  Patient Details  Name: Aaron Mckenzie MRN: [id number] Date of Birth: [birth year]  Subjective/Objective:       Continues with elevated creatinine.  Lasix held.  Patient gaining weight.  Continues on room air.               Action/Plan:   Expected Discharge Date:  11/20/17               Expected Discharge Plan:  Home/Self Care  In-House Referral:     Discharge planning Services     Post Acute Care Choice:    Choice offered to:     DME Arranged:    DME Agency:     HH Arranged:    HH Agency:     Status of Service:  In process, will continue to follow  If discussed at Long Length of Stay Meetings, dates discussed:    Additional Comments:  Elza Rafter, RN 11/13/2017, 12:09 PM

## 2017-11-14 ENCOUNTER — Encounter: Payer: Self-pay | Admitting: *Deleted

## 2017-11-14 LAB — RENAL FUNCTION PANEL
ALBUMIN: 3.2 g/dL — AB (ref 3.5–5.0)
Anion gap: 9 (ref 5–15)
BUN: 59 mg/dL — AB (ref 6–20)
CHLORIDE: 106 mmol/L (ref 98–111)
CO2: 22 mmol/L (ref 22–32)
Calcium: 8.9 mg/dL (ref 8.9–10.3)
Creatinine, Ser: 3.08 mg/dL — ABNORMAL HIGH (ref 0.61–1.24)
GFR calc Af Amer: 24 mL/min — ABNORMAL LOW (ref >60)
GFR, EST NON AFRICAN AMERICAN: 21 mL/min — AB (ref >60)
GLUCOSE: 147 mg/dL — AB (ref 70–99)
Phosphorus: 4.6 mg/dL (ref 2.5–4.6)
Potassium: 4.6 mmol/L (ref 3.5–5.1)
Sodium: 137 mmol/L (ref 135–145)

## 2017-11-14 LAB — BASIC METABOLIC PANEL
ANION GAP: 9 (ref 5–15)
BUN: 53 mg/dL — ABNORMAL HIGH (ref 6–20)
CALCIUM: 8.8 mg/dL — AB (ref 8.9–10.3)
CO2: 22 mmol/L (ref 22–32)
CREATININE: 3.07 mg/dL — AB (ref 0.61–1.24)
Chloride: 105 mmol/L (ref 98–111)
GFR calc Af Amer: 24 mL/min — ABNORMAL LOW (ref >60)
GFR calc non Af Amer: 21 mL/min — ABNORMAL LOW (ref >60)
Glucose, Bld: 143 mg/dL — ABNORMAL HIGH (ref 70–99)
Potassium: 4.5 mmol/L (ref 3.5–5.1)
Sodium: 136 mmol/L (ref 135–145)

## 2017-11-14 LAB — GLUCOSE, CAPILLARY
GLUCOSE-CAPILLARY: 143 mg/dL — AB (ref 70–99)
GLUCOSE-CAPILLARY: 200 mg/dL — AB (ref 70–99)

## 2017-11-14 MED ORDER — GUAIFENESIN-CODEINE 100-10 MG/5ML PO SOLN: 5.0000 mL | Freq: Four times a day (QID) | ORAL | 0 refills | Status: DC | PRN

## 2017-11-14 MED ORDER — CARVEDILOL 3.125 MG PO TABS: 3.1250 mg | ORAL_TABLET | Freq: Two times a day (BID) | ORAL | 0 refills | Status: DC

## 2017-11-14 MED ORDER — FUROSEMIDE 40 MG PO TABS: 40.0000 mg | ORAL_TABLET | Freq: Every day | ORAL | 0 refills | Status: AC

## 2017-11-14 MED ORDER — INSULIN DETEMIR 100 UNIT/ML ~~LOC~~ SOLN: 15.0000 [IU] | Freq: Every day | SUBCUTANEOUS | 11 refills | Status: DC

## 2017-11-14 MED ORDER — ATORVASTATIN CALCIUM 80 MG PO TABS: 80.0000 mg | ORAL_TABLET | Freq: Every day | ORAL | 0 refills | Status: DC

## 2017-11-14 MED ORDER — CLOPIDOGREL BISULFATE 75 MG PO TABS: 75.0000 mg | ORAL_TABLET | Freq: Every day | ORAL | 0 refills | Status: DC

## 2017-11-14 MED ORDER — INSULIN DETEMIR 100 UNIT/ML ~~LOC~~ SOLN: 20.0000 [IU] | Freq: Every day | SUBCUTANEOUS | 11 refills | Status: DC

## 2017-11-14 MED ORDER — FUROSEMIDE 40 MG PO TABS
40.0000 mg | ORAL_TABLET | Freq: Every day | ORAL | Status: DC
  Administered 2017-11-14: 40 mg via ORAL
  Filled 2017-11-14: qty 1

## 2017-11-14 MED ORDER — ASPIRIN 325 MG PO TBEC: 325.0000 mg | DELAYED_RELEASE_TABLET | Freq: Every day | ORAL | 0 refills | Status: AC

## 2017-11-14 NOTE — Progress Notes (Signed)
[place] [facility] [place] NAME: Aaron Mckenzie    MR#:  [id number]  DATE OF BIRTH:  December 23, 1960  SUBJECTIVE:  CHIEF COMPLAINT:   Chief Complaint  Patient presents with  . Code STEMI   Code STEMI activated. Cath done- noted to have tripple vessel disease, no stent placed. No pain now. Renal func continue to be worsening. Patient has no complaints overall.  REVIEW OF SYSTEMS:  CONSTITUTIONAL: No fever, fatigue or weakness.  EYES: No blurred or double vision.  EARS, NOSE, AND THROAT: No tinnitus or ear pain.  RESPIRATORY: No cough, shortness of breath, wheezing or hemoptysis.  CARDIOVASCULAR: No chest pain, orthopnea, edema.  GASTROINTESTINAL: No nausea, vomiting, diarrhea or abdominal pain.  GENITOURINARY: No dysuria, hematuria.  ENDOCRINE: No polyuria, nocturia,  HEMATOLOGY: No anemia, easy bruising or bleeding SKIN: No rash or lesion. MUSCULOSKELETAL: No joint pain or arthritis.   NEUROLOGIC: No tingling, numbness, weakness.  PSYCHIATRY: No anxiety or depression.   ROS  DRUG ALLERGIES:  No Known Allergies  VITALS:  Blood pressure 133/87, pulse 80, temperature 98.5 F (36.9 C), temperature source Oral, resp. rate 16, height 5\' 9"  (1.753 m), weight 108.1 kg, SpO2 97 %.  PHYSICAL EXAMINATION:  GENERAL:  57 y.o.-year-old patient lying in the bed with no acute distress.  EYES: Pupils equal, round, reactive to light and accommodation. No scleral icterus. Extraocular muscles intact.  HEENT: Head atraumatic, normocephalic. Oropharynx and nasopharynx clear.  NECK:  Supple, no jugular venous distention. No thyroid enlargement, no tenderness.  LUNGS: Normal breath sounds bilaterally, no wheezing, some crepitation. No use of accessory muscles of respiration.  CARDIOVASCULAR: S1, S2 normal. No murmurs, rubs, or gallops.  ABDOMEN: Soft, nontender, nondistended. Bowel sounds present. No organomegaly or mass.  EXTREMITIES: No pedal edema, cyanosis,  or clubbing.  NEUROLOGIC: Cranial nerves II through XII are intact. Muscle strength 5/5 in all extremities. Sensation intact. Gait not checked.  PSYCHIATRIC: The patient is alert and oriented x 3.  SKIN: No obvious rash, lesion, or ulcer.   Physical Exam LABORATORY PANEL:   CBC Recent Labs  Lab 11/13/17 0333  WBC 5.8  HGB 10.6*  HCT 30.7*  PLT 212   ------------------------------------------------------------------------------------------------------------------  Chemistries  Recent Labs  Lab 11/14/17 0311  NA 137  136  K 4.6  4.5  CL 106  105  CO2 22  22  GLUCOSE 147*  143*  BUN 59*  53*  CREATININE 3.08*  3.07*  CALCIUM 8.9  8.8*   ------------------------------------------------------------------------------------------------------------------  Cardiac Enzymes Recent Labs  Lab 11/10/17 2133 11/11/17 0427  TROPONINI 26.14* 22.41*   ------------------------------------------------------------------------------------------------------------------  RADIOLOGY:  No results found.  ASSESSMENT AND PLAN:   Active Problems:   STEMI (ST elevation myocardial infarction) ([place])  1.  Acute ST elevation MI.    s/p cardiac cath- tripple vessel disease.  Continue aspirin , plavix and statin therapy.  Added coreg, BP is borderline, may not be able to add lisinopril due to renal failure. 2.  Acute pulmonary edema, ac systolic CHF  Given Lasix IV.   check 2D echo for further evaluation of the heart function.  Stopped lasix now with renal failure. 3.  Acute respiratory failure with hypoxia, secondary to acute pulmonary edema.  Continue treatment with oxygen and diuretics.  Awaiting cardiac cath for further evaluation of coronary arteries. 4.  Acute renal failure on CKD stage 3, likely precipitated by acute heart failure and cath.  continue to monitor kidney function closely while on diuretic  therapy.  Avoid nephrotoxic medications.  Also had cardiac cath done.  IV  fluids and nephro consult as cont to worse.  Nephro suggest to stop IV fluids and give lasix. 5.  Diabetes type 2.  Will monitor blood sugars before meals and at bedtime and use insulin treatment during the hospital stay. 6.  Hypertension, stable.   hold lisinopril for now due to elevated creatinine level.   All the records are reviewed and case discussed with Care Management/Social Workerr. Management plans discussed with the patient, family and they are in agreement.  CODE STATUS: Full.  TOTAL TIME TAKING CARE OF THIS PATIENT: 35 minutes.  Wife in the room.   POSSIBLE D/C IN 1-2 DAYS, DEPENDING ON CLINICAL CONDITION.   Vaughan Basta M.D on 11/14/2017   Between 7am to 6pm - Pager - [phone]  After 6pm go to [email or web address] - password [email or web address] [facility]  Office  [phone]  CC: Primary care physician; Venia Carbon, MD  Note: This dictation was prepared with Dragon dictation along with smaller phrase technology. Any transcriptional errors that result from this process are unintentional.

## 2017-11-14 NOTE — Care Management Note (Signed)
Case Management Note  Patient Details  Name: YU PEGGS MRN: [id number] Date of Birth: 24-Sep-1960  Subjective/Objective:     Independent in all adls, denies issues accessing medical care, obtaining medications or with transportation.  Current with PCP.  No discharge needs identified at present by care manager or members of care team                Action/Plan:   Expected Discharge Date:  11/14/17               Expected Discharge Plan:  Home/Self Care  In-House Referral:     Discharge planning Services     Post Acute Care Choice:    Choice offered to:     DME Arranged:    DME Agency:     HH Arranged:    [place] Agency:     Status of Service:  Completed, signed off  If discussed at [employer] of Stay Meetings, dates discussed:    Additional Comments:  Elza Rafter, RN 11/14/2017, 10:46 AM

## 2017-11-14 NOTE — Discharge Summary (Signed)
[place] [facility] [place] NAME: Aaron Mckenzie    MR#:  [id number]  DATE OF BIRTH:  30-Mar-1960  DATE OF ADMISSION:  11/09/2017 ADMITTING PHYSICIAN: Yolonda Kida, MD  DATE OF DISCHARGE: 11/14/2017   PRIMARY CARE PHYSICIAN: Venia Carbon, MD    ADMISSION DIAGNOSIS:  Acute respiratory distress [R06.03] ST elevation myocardial infarction (STEMI), unspecified artery (HCC) [I21.3] STEMI (ST elevation myocardial infarction) ([place]) [I21.3]  DISCHARGE DIAGNOSIS:  Active Problems:   STEMI (ST elevation myocardial infarction) ([place])   Ac renal failure  SECONDARY DIAGNOSIS:   Past Medical History:  Diagnosis Date  . Diabetes mellitus   . Ectodermal dysplasia   . Hyperlipidemia   . Hypertension   . Pseudogout   . Type 2 diabetes mellitus with neurological manifestations, uncontrolled ([place]) 04/23/2008   Qualifier: Diagnosis of  By: Silvio Pate MD, Elmon Else COURSE:   1.Acute ST elevation MI.  s/p cardiac cath- tripple vessel disease. Continue aspirin , and statin therapy. As per cardiologist- no indication for antiplatelet therapy for now, as stent is not placed.  Added coreg, BP is borderline, may not be able to add lisinopril due to renal failure.  As Currently no intervention done on his Tripple vessel disease, and plan is to refer for CABG as out pt, currently not a candidate for cardiac rehab. 2.Acute pulmonary edema, ac systolic CHF Given Lasix IV. check 2D echo for further evaluation of the heart function. EF 35% Restarted on lasix. 3.Acute respiratory failure with hypoxia, secondary to acute pulmonary edema.Continue treatment with oxygen and diuretics. 4.Acute renal failure on CKD stage 3,likely precipitated by acute heart failure and cath.  continue to monitor kidney function closely while on diuretic therapy.Avoid nephrotoxic medications.  Also had cardiac cath done.  IV fluids and nephro  consult as cont to worse.  Nephro suggest to stop IV fluids and give lasix.  Renal func improved some- creatinin is 3.08- nephro suggest cont lasix on d/c and will follow renal func in 2- 3 days in clinic. 5.Diabetes type 2.Will monitor blood sugars before meals and at bedtime and use insulin treatment during the hospital stay.  We have decreased his insuline dose here according to his gluco checks, and advised about diet control and daily twice blood sugar check after discharge. 6.Hypertension, stable. hold lisinopril for now due to elevated creatinine level.   Started on coreg.  DISCHARGE CONDITIONS:   Stable.  CONSULTS OBTAINED:  Treatment Team:  Amelia Jo, MD Lavonia Dana, MD  DRUG ALLERGIES:  No Known Allergies  DISCHARGE MEDICATIONS:   Allergies as of 11/14/2017   No Known Allergies     Medication List    STOP taking these medications   colchicine 0.6 MG tablet   empagliflozin 10 MG Tabs tablet Commonly known as:  JARDIANCE   gabapentin 300 MG capsule Commonly known as:  NEURONTIN   insulin NPH Human 100 UNIT/ML injection Commonly known as:  HUMULIN N,NOVOLIN N   insulin regular 100 units/mL injection Commonly known as:  NOVOLIN R,HUMULIN R   lisinopril 20 MG tablet Commonly known as:  PRINIVIL,ZESTRIL   pravastatin 40 MG tablet Commonly known as:  PRAVACHOL     TAKE these medications   ACCU-CHEK GUIDE w/Device Kit 1 each by Does not apply route 2 (two) times daily.   ACCU-CHEK SOFTCLIX LANCETS lancets Use as instructed to check blood sugars twice daily.   ALPHAGAN P 0.1 % Soln Generic drug:  brimonidine Place 1 drop into both eyes 2 (two) times daily.   aspirin 325 MG EC tablet Take 1 tablet (325 mg total) by mouth daily. Start taking on:  11/15/2017   atorvastatin 80 MG tablet Commonly known as:  LIPITOR Take 1 tablet (80 mg total) by mouth daily at 6 PM.   carvedilol 3.125 MG tablet Commonly known as:  COREG Take 1 tablet  (3.125 mg total) by mouth 2 (two) times daily with a meal.   dorzolamide-timolol 22.3-6.8 MG/ML ophthalmic solution Commonly known as:  COSOPT Place 1 drop into both eyes 2 (two) times daily.   furosemide 40 MG tablet Commonly known as:  LASIX Take 1 tablet (40 mg total) by mouth daily. Start taking on:  11/15/2017 What changed:    medication strength  how much to take   glucose blood test strip Use as instructed to check blood sugars twice daily.   guaiFENesin-codeine 100-10 MG/5ML syrup Take 5 mLs by mouth every 6 (six) hours as needed for cough.   insulin detemir 100 UNIT/ML injection Commonly known as:  LEVEMIR Inject 0.2 mLs (20 Units total) into the skin at bedtime.   insulin detemir 100 UNIT/ML injection Commonly known as:  LEVEMIR Inject 0.15 mLs (15 Units total) into the skin daily. In morning. Start taking on:  11/15/2017   INSULIN SYRINGE .5CC/29G 29G X 1/2" 0.5 ML Misc USE TWICE DAILY   urea 10 % cream Commonly known as:  CARMOL APPLY TOPICALLY DAILY AS NEEDED FOR FEET        DISCHARGE INSTRUCTIONS:    Follow with Cardiology clinic in 1 week and with nephrology clinic in 2 days.  If you experience worsening of your admission symptoms, develop shortness of breath, life threatening emergency, suicidal or homicidal thoughts you must seek medical attention immediately by calling 911 or calling your MD immediately  if symptoms less severe.  You Must read complete instructions/literature along with all the possible adverse reactions/side effects for all the Medicines you take and that have been prescribed to you. Take any new Medicines after you have completely understood and accept all the possible adverse reactions/side effects.   Please note  You were cared for by a hospitalist during your hospital stay. If you have any questions about your discharge medications or the care you received while you were in the hospital after you are discharged, you can call  the unit and asked to speak with the hospitalist on call if the hospitalist that took care of you is not available. Once you are discharged, your primary care physician will handle any further medical issues. Please note that NO REFILLS for any discharge medications will be authorized once you are discharged, as it is imperative that you return to your primary care physician (or establish a relationship with a primary care physician if you do not have one) for your aftercare needs so that they can reassess your need for medications and monitor your lab values.    Today   CHIEF COMPLAINT:   Chief Complaint  Patient presents with  . Code STEMI    HISTORY OF PRESENT ILLNESS:  Aaron Mckenzie  is a 57 y.o. male with a known history of obesity, diabetes type 2, hypertension and other comorbidities. Patient presented to emergency room for acute onset of moderate shortness of breath and dry cough, started suddenly after dinner.  His symptoms are worse with exertion.  Patient denies any chest pain or palpitations, he denies having any similar episodes in  the past. Blood test done emergency room, are notable for elevated creatinine level at 1.78 and elevated troponin level at 0.29. EKG shows sinus tachycardia with heart rate at 120 and acute ST elevation in the inferior leads. Chest x-ray shows pulmonary edema. Code STEMI is called.   VITAL SIGNS:  Blood pressure 135/90, pulse 77, temperature 98.3 F (36.8 C), temperature source Oral, resp. rate 18, height 5' 9"  (1.753 m), weight 108.1 kg, SpO2 95 %.  I/O:    Intake/Output Summary (Last 24 hours) at 11/14/2017 1044 Last data filed at 11/14/2017 0253 Gross per 24 hour  Intake 3 ml  Output 0 ml  Net 3 ml    PHYSICAL EXAMINATION:  GENERAL:  57 y.o.-year-old patient lying in the bed with no acute distress.  EYES: Pupils equal, round, reactive to light and accommodation. No scleral icterus. Extraocular muscles intact.  HEENT: Head  atraumatic, normocephalic. Oropharynx and nasopharynx clear.  NECK:  Supple, no jugular venous distention. No thyroid enlargement, no tenderness.  LUNGS: Normal breath sounds bilaterally, no wheezing, rales,rhonchi or crepitation. No use of accessory muscles of respiration.  CARDIOVASCULAR: S1, S2 normal. No murmurs, rubs, or gallops.  ABDOMEN: Soft, non-tender, non-distended. Bowel sounds present. No organomegaly or mass.  EXTREMITIES: No pedal edema, cyanosis, or clubbing.  NEUROLOGIC: Cranial nerves II through XII are intact. Muscle strength 5/5 in all extremities. Sensation intact. Gait not checked.  PSYCHIATRIC: The patient is alert and oriented x 3.  SKIN: No obvious rash, lesion, or ulcer.   DATA REVIEW:   CBC Recent Labs  Lab 11/13/17 0333  WBC 5.8  HGB 10.6*  HCT 30.7*  PLT 212    Chemistries  Recent Labs  Lab 11/14/17 0311  NA 137  136  K 4.6  4.5  CL 106  105  CO2 22  22  GLUCOSE 147*  143*  BUN 59*  53*  CREATININE 3.08*  3.07*  CALCIUM 8.9  8.8*    Cardiac Enzymes Recent Labs  Lab 11/11/17 0427  TROPONINI 22.41*    Microbiology Results  Results for orders placed or performed during the hospital encounter of 11/09/17  MRSA PCR Screening     Status: None   Collection Time: 11/10/17  1:00 AM  Result Value Ref Range Status   MRSA by PCR NEGATIVE NEGATIVE Final    Comment:        The GeneXpert MRSA Assay (FDA approved for NASAL specimens only), is one component of a comprehensive MRSA colonization surveillance program. It is not intended to diagnose MRSA infection nor to guide or monitor treatment for MRSA infections. Performed at [facility], [address] [place], [place] [address]     RADIOLOGY:  No results found.  EKG:   Orders placed or performed during the hospital encounter of 11/09/17  . ED EKG  . ED EKG  . EKG 12-Lead  . EKG 12-Lead      Management plans discussed with the patient, family and they are  in agreement.  CODE STATUS:     Code Status Orders  (From admission, onward)         Start     Ordered   11/10/17 0059  Full code  Continuous     11/10/17 0059        Code Status History    Date Active Date Inactive Code Status Order ID Comments User Context   11/10/2017 0058 11/10/2017 0059 Full Code [id number]  Yolonda Kida, MD Inpatient  TOTAL TIME TAKING CARE OF THIS PATIENT: 34 minutes.    Vaughan Basta M.D on 11/14/2017 at 10:44 AM  Between 7am to 6pm - Pager - [phone]  After 6pm go to [email or web address] - password [email or web address] [facility]  Office  [phone]  CC: Primary care physician; Venia Carbon, MD   Note: This dictation was prepared with Dragon dictation along with smaller phrase technology. Any transcriptional errors that result from this process are unintentional.

## 2017-11-14 NOTE — Plan of Care (Signed)
  Problem: Activity: Goal: Risk for activity intolerance will decrease Outcome: Progressing Note:  Up to bathroom independently, tolerated well   Problem: Pain Managment: Goal: General experience of comfort will improve Outcome: Progressing Note:  No complaints of pain this shift   Problem: Safety: Goal: Ability to remain free from injury will improve Outcome: Progressing   Problem: Activity: Goal: Ability to return to baseline activity level will improve Outcome: Progressing   Problem: Education: Goal: Knowledge of General Education information will improve Description Including pain rating scale, medication(s)/side effects and non-pharmacologic comfort measures Outcome: Completed/Met   Problem: Cardiovascular: Goal: Vascular access site(s) Level 0-1 will be maintained Outcome: Completed/Met Note:  Right groin cath site, level 0, band aid in place,

## 2017-11-14 NOTE — Progress Notes (Signed)
[facility]  ROUNDING NOTE   Subjective:   Patient laying in bed. No complaints.   Creatinine 3.07 (3.54)  Objective:  Vital signs in last 24 hours:  Temp:  [98.3 F (36.8 C)-98.6 F (37 C)] 98.3 F (36.8 C) (09/18 0742) Pulse Rate:  [77-87] 77 (09/18 0742) Resp:  [16-18] 18 (09/18 0742) BP: (133-144)/(87-93) 135/90 (09/18 0742) SpO2:  [95 %-98 %] 95 % (09/18 0742) Weight:  [108.1 kg] 108.1 kg (09/18 0448)  Weight change: -5.443 kg Filed Weights   11/12/17 0346 11/13/17 0322 11/14/17 0448  Weight: 112 kg 113.6 kg 108.1 kg    Intake/Output: I/O last 3 completed shifts: In: 1764.1 [I.V.:1764.1] Out: 0    Intake/Output this shift:  No intake/output data recorded.  Physical Exam: General: NAD, laying in bed  Head: Normocephalic, atraumatic. Moist oral mucosal membranes  Eyes: Anicteric, PERRL  Neck: Supple, trachea midline  Lungs:  Clear to auscultation  Heart: Regular rate and rhythm  Abdomen:  Soft, nontender, mild distension  Extremities: 1+ peripheral edema.  Neurologic: Nonfocal, moving all four extremities  Skin: No lesions        Basic Metabolic Panel: Recent Labs  Lab 11/10/17 0739 11/11/17 0427 11/12/17 0354 11/13/17 0333 11/14/17 0311  NA 136 136 131* 131* 137  136  K 4.1 3.9 4.0 4.3 4.6  4.5  CL 105 106 100 103 106  105  CO2 22 22 22  20* 22  22  GLUCOSE 213* 154* 122* 101* 147*  143*  BUN 34* 37* 53* 55* 59*  53*  CREATININE 1.89* 2.59* 3.75* 3.54* 3.08*  3.07*  CALCIUM 8.1* 8.1* 8.2* 8.0* 8.9  8.8*  PHOS  --   --   --   --  4.6    Liver Function Tests: Recent Labs  Lab 11/14/17 0311  ALBUMIN 3.2*   No results for input(s): LIPASE, AMYLASE in the last 168 hours. No results for input(s): AMMONIA in the last 168 hours.  CBC: Recent Labs  Lab 11/09/17 2154 11/10/17 0739 11/13/17 0333  WBC 9.2 8.0 5.8  HGB 13.4 11.1* 10.6*  HCT 39.8* 32.5* 30.7*  MCV 85.0 84.0 84.7  PLT 264 212 212    Cardiac  Enzymes: Recent Labs  Lab 11/09/17 2154 11/10/17 1543 11/10/17 2133 11/11/17 0427  TROPONINI 0.29* 34.32* 26.14* 22.41*    BNP: Invalid input(s): POCBNP  CBG: Recent Labs  Lab 11/13/17 0913 11/13/17 1200 11/13/17 1855 11/13/17 2101 11/14/17 0744  GLUCAP 152* 129* 155* 190* 143*    Microbiology: Results for orders placed or performed during the hospital encounter of 11/09/17  MRSA PCR Screening     Status: None   Collection Time: 11/10/17  1:00 AM  Result Value Ref Range Status   MRSA by PCR NEGATIVE NEGATIVE Final    Comment:        The GeneXpert MRSA Assay (FDA approved for NASAL specimens only), is one component of a comprehensive MRSA colonization surveillance program. It is not intended to diagnose MRSA infection nor to guide or monitor treatment for MRSA infections. Performed at [facility], [address] [place], [place] [address]     Coagulation Studies: No results for input(s): LABPROT, INR in the last 72 hours.  Urinalysis: No results for input(s): COLORURINE, LABSPEC, PHURINE, GLUCOSEU, HGBUR, BILIRUBINUR, KETONESUR, PROTEINUR, UROBILINOGEN, NITRITE, LEUKOCYTESUR in the last 72 hours.  Invalid input(s): APPERANCEUR    Imaging: No results found.   Medications:   . sodium chloride     .  aspirin EC  325 mg Oral Daily  . atorvastatin  80 mg Oral q1800  . brimonidine  1 drop Right Eye BID  . carvedilol  3.125 mg Oral BID WC  . clopidogrel  75 mg Oral Daily  . docusate sodium  100 mg Oral BID  . dorzolamide-timolol  1 drop Both Eyes BID  . furosemide  40 mg Oral Daily  . heparin  5,000 Units Subcutaneous Q8H  . insulin aspart  0-15 Units Subcutaneous TID WC  . insulin aspart  0-5 Units Subcutaneous QHS  . insulin detemir  20 Units Subcutaneous QHS   And  . insulin detemir  15 Units Subcutaneous Daily  . sodium chloride flush  3 mL Intravenous Q12H  . sodium chloride flush  3 mL Intravenous Q12H   sodium chloride,  acetaminophen **OR** acetaminophen, acetaminophen, bisacodyl, HYDROcodone-acetaminophen, ondansetron **OR** ondansetron (ZOFRAN) IV, sodium chloride flush  Assessment/ Plan:  [name] is a 57 y.o. 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  Acute renal failure secondary to contrast induced nephropathy. Complicated with hyponatremia - Holding lisinopril  - Restart furosemide 40mg  PO daily.   2. Hypertension:   - carvedilol. Holding lisinopril    3. Acute coronary artery disease: appreciate cardiology input.  Status post cardiac catheterization on 9/13.   Hospital Follow up with Nephrology in 1-2 weeks.    LOS: 4 Rubee Vega 9/18/201911:15 AM

## 2017-11-14 NOTE — Progress Notes (Signed)
Discharge instructions explained to pt and pts spouse/ verbalized an understanding/ iv and tele removed/ RX given to pt/ transported off unit via wheelchair.

## 2017-11-19 ENCOUNTER — Other Ambulatory Visit: Payer: Self-pay

## 2017-11-19 NOTE — Patient Outreach (Signed)
[facility]) Care Management  11/19/2017  Aaron Mckenzie [birth year] [id number]   EMMI- General Discharge RED ON EMMI ALERT Day # 1 Date: 11/16/17 Red Alert Reason: Unfilled prescriptions? yes   Outreach attempt: spoke with patient. He is able to verify HIPAA.  Discussed red alert with patient.  He states he has all his medications, taking as prescribed and is able to afford medications.  Patient noted to have follow up appointments this week.  Patient states that he is aware of appointments and has transportation to appointments.  Patient reports he is doing ok since being home and offers no questions or concerns.    Plan: RN CM will close case as patient offers no needs at this time.    Jone Baseman, RN, MSN [facility] Care Management Care Management Coordinator Direct Line [phone] Toll Free: [phone]  Fax: [phone]

## 2017-11-21 DIAGNOSIS — I129 Hypertensive chronic kidney disease with stage 1 through stage 4 chronic kidney disease, or unspecified chronic kidney disease: Secondary | ICD-10-CM | POA: Diagnosis not present

## 2017-11-21 DIAGNOSIS — R809 Proteinuria, unspecified: Secondary | ICD-10-CM | POA: Diagnosis not present

## 2017-11-21 DIAGNOSIS — E1122 Type 2 diabetes mellitus with diabetic chronic kidney disease: Secondary | ICD-10-CM | POA: Diagnosis not present

## 2017-11-21 DIAGNOSIS — N183 Chronic kidney disease, stage 3 (moderate): Secondary | ICD-10-CM | POA: Diagnosis not present

## 2017-11-21 DIAGNOSIS — N179 Acute kidney failure, unspecified: Secondary | ICD-10-CM | POA: Diagnosis not present

## 2017-11-22 DIAGNOSIS — I10 Essential (primary) hypertension: Secondary | ICD-10-CM | POA: Diagnosis not present

## 2017-11-22 DIAGNOSIS — I2510 Atherosclerotic heart disease of native coronary artery without angina pectoris: Secondary | ICD-10-CM | POA: Diagnosis not present

## 2017-11-22 DIAGNOSIS — I2119 ST elevation (STEMI) myocardial infarction involving other coronary artery of inferior wall: Secondary | ICD-10-CM | POA: Diagnosis not present

## 2017-11-22 DIAGNOSIS — E782 Mixed hyperlipidemia: Secondary | ICD-10-CM | POA: Diagnosis not present

## 2017-11-22 DIAGNOSIS — R0609 Other forms of dyspnea: Secondary | ICD-10-CM | POA: Diagnosis not present

## 2017-11-23 ENCOUNTER — Telehealth: Payer: Self-pay

## 2017-11-23 NOTE — Telephone Encounter (Signed)
Call to check on patient after recent hospitalization.  He did f/u with Dr. Clayborn Bigness yesterday and saw his renal doctor on Wednesday.  He is now on Levemir which is causing him some issues with cost.  He will discuss this with Dr. Cruzita Lederer on Monday when he sees her.  Overall he is doing okay.  Dr. Clayborn Bigness made some additions/changes to his medications which I have updated in his medication list.  For now they are managing his blockages with medications but surgery is still an option down the road if needed.    FYI to Dr. Silvio Pate

## 2017-11-24 NOTE — Telephone Encounter (Signed)
Noted I had thought that CABG was in the plans---but later messages I got mentioned just medical therapy

## 2017-11-26 ENCOUNTER — Encounter: Payer: Self-pay | Admitting: Internal Medicine

## 2017-11-26 ENCOUNTER — Ambulatory Visit (INDEPENDENT_AMBULATORY_CARE_PROVIDER_SITE_OTHER): Payer: Medicare Other | Admitting: Internal Medicine

## 2017-11-26 ENCOUNTER — Other Ambulatory Visit: Payer: Self-pay

## 2017-11-26 VITALS — BP 150/90 | HR 83 | Ht 69.0 in | Wt 235.0 lb

## 2017-11-26 DIAGNOSIS — E785 Hyperlipidemia, unspecified: Secondary | ICD-10-CM

## 2017-11-26 DIAGNOSIS — Z23 Encounter for immunization: Secondary | ICD-10-CM | POA: Diagnosis not present

## 2017-11-26 DIAGNOSIS — E1142 Type 2 diabetes mellitus with diabetic polyneuropathy: Secondary | ICD-10-CM

## 2017-11-26 DIAGNOSIS — IMO0002 Reserved for concepts with insufficient information to code with codable children: Secondary | ICD-10-CM

## 2017-11-26 DIAGNOSIS — Z794 Long term (current) use of insulin: Secondary | ICD-10-CM

## 2017-11-26 DIAGNOSIS — E1165 Type 2 diabetes mellitus with hyperglycemia: Secondary | ICD-10-CM | POA: Diagnosis not present

## 2017-11-26 MED ORDER — INSULIN NPH (HUMAN) (ISOPHANE) 100 UNIT/ML ~~LOC~~ SUSP: SUBCUTANEOUS | 11 refills | Status: AC

## 2017-11-26 MED ORDER — INSULIN REGULAR HUMAN 100 UNIT/ML IJ SOLN: INTRAMUSCULAR | 11 refills | Status: AC

## 2017-11-26 NOTE — Progress Notes (Signed)
Patient ID: Aaron Mckenzie, male   DOB: 05/16/1960, 57 y.o.   MRN: [id number]  HPI: Aaron Mckenzie is a 57 y.o.-year-old male, returning for f/u for DM2, dx 2004, insulin-dependent since 12/2012, uncontrolled, with complications (CKD, neuropathy, DR). Last visit 4 months ago.  Since last visit, he had a STEMI 11/09/2017. He had cough, SOB and vomiting, no CP.  At hospital discharge, he was started on Levemir (cannot afford it) and no R insulin >> sugars now much higher.  He continues to have a foot ulcer and sees Dr. Thresa Ross at [facility].   Last hemoglobin A1c was:  Lab Results  Component Value Date   HGBA1C 9.0 (H) 11/10/2017   HGBA1C 9.3 (A) 07/16/2017   HGBA1C 11.5 04/19/2017  04/19/2017: The HbA1c calculated from fructosamine is much better: 7.8%! 01/16/2017: 10.2%  Pt was on: U500 insulin: - 20 units before a smaller meal - 22 units before a larger meal. Sliding scale of insulin: - 150-175: + 1 unit  - 176-200: + 2 units  - 201-225: + 3 units  - > 226: + 4 units  We stopped Glipizide 5 mg bid when we started mealtime insulin We had to stop Metformin 2/2 decreased kidney fxn.  He was then on: N and R insulin (ReliOn): Insulin Before brunch Before dinner  Regular 50 40  NPH 60  50    He was on: - 70/30 insulin - but actually using only R insulin!!!! - 50 units before b'fast - 70 units before dinner - Jardiance 10 mg before b'fast - started 03/2017  At last visit, we changed to: N and R insulin (ReliOn): Insulin Before brunch Before dinner  Regular (clear) 30 >> 15 40 >> 25  NPH (cloudy) 30 >> 15 40 >> 25   -  >> stopped b/c AKI  Since hospitalization: (!!) - Levemir 15 units in am and 20 units at bedtime  Pt checks sugars twice a day - after his hospitalization: - am:  170-218 >> 200-240 >> 197-269, 316 - before lunch: 131, 170 >> n/c >> 223 >> n/c - 2h after lunch: 400, 536 >> n/c  >> 197 >> n/c - before dinner:   107-122, 138 >> 100-120 >> n/c - 2h  after dinner: 300s >> n/c  - bedtime: 53, 68, 120, 154 >> 180-190 >> n/c >> 201-324 Lowest: 120 >>  107 >> 100 >> 197 Highest: 484 >> 218  >> 240 >> 324.  Pt's meals are: - Breakfast: eggs, meat, grits, wheat toast - Lunch: may skip or eat late b'fast  - Dinner: chicken/fish/pork chops, 2 sides, salad, baked potatoes - Snacks: 2x a day - apple slices, popcorn  - + CKD, last BUN/creatinine:  11/21/2017: GFR 46 - at the appt with nephrology - Dr. Garen Lah Prev AKI b/c contrast subst. Lab Results  Component Value Date   BUN 53 (H) 11/14/2017   BUN 59 (H) 11/14/2017   CREATININE 3.07 (H) 11/14/2017   CREATININE 3.08 (H) 11/14/2017  11/29/2016: BUN/Cr 17/1.31, GFR 60, Glu 484  + Proteinuria: 11/29/2016: 12,806 mg/g  On  lisinopril.  - + HL; reviewed latest 3 sets of lipids: Lab Results  Component Value Date   CHOL 155 11/10/2017   CHOL 175 03/05/2017   CHOL 121 02/15/2016   Lab Results  Component Value Date   HDL 19 (L) 11/10/2017   HDL 27.50 (L) 03/05/2017   HDL 22.20 (L) 02/15/2016   Lab Results  Component Value Date  [place] 91 11/10/2017   Lab Results  Component Value Date   TRIG 223 (H) 11/10/2017   TRIG (H) 03/05/2017    577.0 Triglyceride is over 400; calculations on Lipids are invalid.   TRIG 301.0 (H) 02/15/2016   Lab Results  Component Value Date   CHOLHDL 8.2 11/10/2017   CHOLHDL 6 03/05/2017   CHOLHDL 5 02/15/2016   Lab Results  Component Value Date   LDLDIRECT 76.0 03/05/2017   LDLDIRECT 55.0 02/15/2016   LDLDIRECT 122.0 01/29/2015   On pravastatin.  On ASA 81 >> 325, also on Plavix. Started a beta blocker.  - last eye exam was in 01/2017: + DR. He saw Dr. Katy Fitch >> had cataract sx 08/2016.  He has intraocular injections.  He is also on drops for glaucoma.  - + numbness and tingling in his feet and hands.  Neurontin - off now.  He also has a history of HTN, pseudogout, bilateral carpal tunnel s/p sx in each hand: 1985 and  2000.  ROS: Constitutional: no weight gain/+ weight loss, + fatigue, no subjective hyperthermia, no subjective hypothermia Eyes: no blurry vision, no xerophthalmia ENT: no sore throat, no nodules palpated in throat, no dysphagia, no odynophagia, no hoarseness Cardiovascular: + CP/+ SOB/no palpitations/no leg swelling Respiratory: +cough/+ SOB/no wheezing Gastrointestinal: no N/no V/no D/no C/no acid reflux Musculoskeletal: +muscle aches/+ joint aches Skin: no rashes, no hair loss Neurological: no tremors/+ numbness/+ tingling/no dizziness + difficulty with erections  I reviewed pt's medications, allergies, PMH, social hx, family hx, and changes were documented in the history of present illness. Otherwise, unchanged from my initial visit note.  Past Medical History:  Diagnosis Date  . Diabetes mellitus   . Ectodermal dysplasia   . Hyperlipidemia   . Hypertension   . Pseudogout   . Type 2 diabetes mellitus with neurological manifestations, uncontrolled ([place]) 04/23/2008   Qualifier: Diagnosis of  By: Silvio Pate MD, [facility]    Past Surgical History:  Procedure Laterality Date  . APPENDECTOMY    . CATARACT EXTRACTION Left 07/2016  . CORONARY BALLOON ANGIOPLASTY N/A 11/09/2017   Procedure: CORONARY BALLOON ANGIOPLASTY;  Surgeon: Yolonda Kida, MD;  Location: [facility];  Service: Cardiovascular;  Laterality: N/A;  . LEFT HEART CATH AND CORONARY ANGIOGRAPHY N/A 11/09/2017   Procedure: LEFT HEART CATH AND CORONARY ANGIOGRAPHY;  Surgeon: Yolonda Kida, MD;  Location: [facility];  Service: Cardiovascular;  Laterality: N/A;  . RIB FRACTURE SURGERY  1988   right   Social History   Socioeconomic History  . Marital status: Married    Spouse name: Not on file  . Number of children: Not on file  . Years of education: Not on file  . Highest education level: Not on file  Occupational History  . Occupation: Retired after disability    Comment: [place] at  [employer]  . Financial resource strain: Not on file  . Food insecurity:    Worry: Not on file    Inability: Not on file  . Transportation needs:    Medical: Not on file    Non-medical: Not on file  Tobacco Use  . Smoking status: Never Smoker  . Smokeless tobacco: Never Used  Substance and Sexual Activity  . Alcohol use: No  . Drug use: No  . Sexual activity: Yes  Lifestyle  . Physical activity:    Days per week: Not on file    Minutes per session: Not on file  .  Stress: Not on file  Relationships  . Social connections:    Talks on phone: Not on file    Gets together: Not on file    Attends religious service: Not on file    Active member of club or organization: Not on file    Attends meetings of clubs or organizations: Not on file    Relationship status: Not on file  . Intimate partner violence:    Fear of current or ex partner: Not on file    Emotionally abused: Not on file    Physically abused: Not on file    Forced sexual activity: Not on file  Other Topics Concern  . Not on file  Social History Narrative   No living will or health care POA   Would want wife to make medical decisions for him--alternate is brother   Would accept CPR but no prolonged ventilation   Not sure about tube feeds   Current Outpatient Medications on File Prior to Visit  Medication Sig Dispense Refill  . ACCU-CHEK SOFTCLIX LANCETS lancets Use as instructed to check blood sugars twice daily. 200 each 3  . aspirin EC 325 MG EC tablet Take 1 tablet (325 mg total) by mouth daily. 30 tablet 0  . atorvastatin (LIPITOR) 80 MG tablet Take 1 tablet (80 mg total) by mouth daily at 6 PM. 30 tablet 0  . Blood Glucose Monitoring Suppl (ACCU-CHEK GUIDE) w/Device KIT 1 each by Does not apply route 2 (two) times daily. 1 kit 0  . brimonidine (ALPHAGAN P) 0.1 % SOLN Place 1 drop into both eyes 2 (two) times daily.     . carvedilol (COREG) 3.125 MG tablet Take 1 tablet (3.125 mg total) by mouth 2  (two) times daily with a meal. 60 tablet 0  . dorzolamide-timolol (COSOPT) 22.3-6.8 MG/ML ophthalmic solution Place 1 drop into both eyes 2 (two) times daily.     . furosemide (LASIX) 40 MG tablet Take 1 tablet (40 mg total) by mouth daily. (Patient taking differently: Take by mouth daily. ) 30 tablet 0  . glucose blood (ACCU-CHEK GUIDE) test strip Use as instructed to check blood sugars twice daily. 200 each 3  . guaiFENesin-codeine 100-10 MG/5ML syrup Take 5 mLs by mouth every 6 (six) hours as needed for cough. 120 mL 0  . hydrALAZINE (APRESOLINE) 25 MG tablet Take 25 mg by mouth 3 (three) times daily.    . insulin detemir (LEVEMIR) 100 UNIT/ML injection Inject 0.2 mLs (20 Units total) into the skin at bedtime. 10 mL 11  . insulin detemir (LEVEMIR) 100 UNIT/ML injection Inject 0.15 mLs (15 Units total) into the skin daily. In morning. 10 mL 11  . INSULIN SYRINGE .5CC/29G 29G X 1/2" 0.5 ML MISC USE TWICE DAILY 100 each 0  . isosorbide mononitrate (IMDUR) 30 MG 24 hr tablet Take 30 mg by mouth daily.    . urea (CARMOL) 10 % cream APPLY TOPICALLY DAILY AS NEEDED FOR FEET 85 g 5   No current facility-administered medications on file prior to visit.    No Known Allergies Family History  Problem Relation Age of Onset  . Arthritis Mother   . Arthritis Father     PE: BP (!) 150/90   Pulse 83   Ht 5' 9"  (1.753 m) Comment: measured  Wt 235 lb (106.6 kg)   BMI 34.70 kg/m  Body mass index is 34.7 kg/m. Wt Readings from Last 3 Encounters:  11/26/17 235 lb (106.6 kg)  11/14/17 238  lb 6.4 oz (108.1 kg)  07/16/17 242 lb (109.8 kg)   Constitutional: overweight, in NAD Eyes: PERRLA, EOMI, no exophthalmos ENT: moist mucous membranes, no thyromegaly, no cervical lymphadenopathy Cardiovascular: RRR, No MRG, + B LE pitting edema Respiratory: CTA B Gastrointestinal: abdomen soft, NT, ND, BS+ Musculoskeletal: no deformities, strength intact in all 4 Skin: moist, warm, no rashes Neurological: no  tremor with outstretched hands, DTR normal in all 4  ASSESSMENT: 1. DM2, insulin-dependent, uncontrolled, with complications - increased insulin resistance - Cardiovascular disease- s/p STEMI 11/09/2017 - PN  -stable - CKD  2. HL  3.  Obesity  PLAN:  1. Patient with long-standing, uncontrolled, type 2 diabetes, with high insulin resistance, previously on U500 insulin, which unfortunately we had to stop because of lack of insurance coverage.  His diabetes control worsened afterwards so we had to increase his premixed insulin.  However, at last visit, I advised him to switch to a regimen containing NPH and regular insulin.  It turned out that at that time he was only taking regular insulin.  We continue the Jardiance. -At this visit, he is off Jardiance due to his AKI during this last hospitalization.  However, he had an appointment with nephrology yesterday and he is GFR increased to 46.  His lisinopril was initially stopped in the hospital and then restarted yesterday.  At this visit, we can also restart Jardiance. -Before his acute MI, his sugars were very well controlled before  dinner but they were higher in a.m. due to eating late and also snacks after dinner.  However, after his hospitalization sugars are significantly higher in the 200s and 300s.  We discussed that he absolutely needs mealtime insulin.  He usually eats 2 meals a day so we will start back regular insulin twice a day.  I advised him how to titrate the regular insulin before dinner, if needed -For now we will continue Levemir until he runs out and then switch to NPH. - I advised him to:  Patient Instructions   Please change:  N and R insulin (ReliOn): Insulin Before brunch Before dinner  Regular (clear) 15 15 (can increase to 20-25 units if needed)  NPH (cloudy) 15 25   Please restart: - Jardiance 10 mg before b'fast  Please return in 3 months with your sugar log.   - continue checking sugars at different times of  the day - check 2x a day, rotating checks - advised for yearly eye exams >> he is UTD - + flu shot today - Return to clinic in 3 mo with sugar log     2. HL - Reviewed latest lipid panel from 10/2017: LDL above goal in the setting of cardiovascular disease, HDL very low, triglycerides high but much improved Lab Results  Component Value Date   CHOL 155 11/10/2017   HDL 19 (L) 11/10/2017   LDLCALC 91 11/10/2017   LDLDIRECT 76.0 03/05/2017   TRIG 223 (H) 11/10/2017   CHOLHDL 8.2 11/10/2017  - Continues pravastatin without side effects.  At last visit, discussed about reducing fatty foods.  3.  Obesity -He lost 7 pounds since last visit -We will restart Jardiance which should also help with weight loss  Philemon Kingdom, MD PhD [facility]

## 2017-11-26 NOTE — Patient Instructions (Addendum)
Please change:  N and R insulin (ReliOn): Insulin Before brunch Before dinner  Regular (clear) 15 15 (can increase to 20-25 units if needed)  NPH (cloudy) 15 25   Please restart: - Jardiance 10 mg before b'fast  Please return in 3 months with your sugar log.

## 2017-11-26 NOTE — Patient Outreach (Signed)
[facility]) Care Management  11/26/2017  Aaron Mckenzie 1960-07-14 [id number]   Medication Adherence call to Aaron Mckenzie spoke with patient he said he had a heart attack not to long ago and some of his medication where switch around but still has medication and does not need any at this time. patient is due on Lisinopril 20 mg. Aaron Mckenzie is showing past due under [place].   [place] Management Direct Dial [phone]  Fax [phone] [email or web address]

## 2017-11-29 ENCOUNTER — Encounter (INDEPENDENT_AMBULATORY_CARE_PROVIDER_SITE_OTHER): Payer: Medicare Other | Admitting: Ophthalmology

## 2017-12-03 DIAGNOSIS — B351 Tinea unguium: Secondary | ICD-10-CM | POA: Diagnosis not present

## 2017-12-03 DIAGNOSIS — Z794 Long term (current) use of insulin: Secondary | ICD-10-CM | POA: Diagnosis not present

## 2017-12-03 DIAGNOSIS — E1140 Type 2 diabetes mellitus with diabetic neuropathy, unspecified: Secondary | ICD-10-CM | POA: Diagnosis not present

## 2017-12-05 DIAGNOSIS — R809 Proteinuria, unspecified: Secondary | ICD-10-CM | POA: Diagnosis not present

## 2017-12-05 DIAGNOSIS — E1122 Type 2 diabetes mellitus with diabetic chronic kidney disease: Secondary | ICD-10-CM | POA: Diagnosis not present

## 2017-12-05 DIAGNOSIS — N183 Chronic kidney disease, stage 3 (moderate): Secondary | ICD-10-CM | POA: Diagnosis not present

## 2017-12-05 DIAGNOSIS — I129 Hypertensive chronic kidney disease with stage 1 through stage 4 chronic kidney disease, or unspecified chronic kidney disease: Secondary | ICD-10-CM | POA: Diagnosis not present

## 2017-12-05 DIAGNOSIS — E559 Vitamin D deficiency, unspecified: Secondary | ICD-10-CM | POA: Diagnosis not present

## 2017-12-13 ENCOUNTER — Encounter (INDEPENDENT_AMBULATORY_CARE_PROVIDER_SITE_OTHER): Payer: Medicare Other | Admitting: Ophthalmology

## 2017-12-13 DIAGNOSIS — H43813 Vitreous degeneration, bilateral: Secondary | ICD-10-CM

## 2017-12-13 DIAGNOSIS — I10 Essential (primary) hypertension: Secondary | ICD-10-CM | POA: Diagnosis not present

## 2017-12-13 DIAGNOSIS — E11311 Type 2 diabetes mellitus with unspecified diabetic retinopathy with macular edema: Secondary | ICD-10-CM

## 2017-12-13 DIAGNOSIS — H35033 Hypertensive retinopathy, bilateral: Secondary | ICD-10-CM | POA: Diagnosis not present

## 2017-12-13 DIAGNOSIS — E113313 Type 2 diabetes mellitus with moderate nonproliferative diabetic retinopathy with macular edema, bilateral: Secondary | ICD-10-CM | POA: Diagnosis not present

## 2017-12-26 DIAGNOSIS — R0609 Other forms of dyspnea: Secondary | ICD-10-CM | POA: Diagnosis not present

## 2017-12-26 DIAGNOSIS — E782 Mixed hyperlipidemia: Secondary | ICD-10-CM | POA: Diagnosis not present

## 2017-12-26 DIAGNOSIS — I2119 ST elevation (STEMI) myocardial infarction involving other coronary artery of inferior wall: Secondary | ICD-10-CM | POA: Diagnosis not present

## 2017-12-26 DIAGNOSIS — I2510 Atherosclerotic heart disease of native coronary artery without angina pectoris: Secondary | ICD-10-CM | POA: Diagnosis not present

## 2017-12-26 DIAGNOSIS — I10 Essential (primary) hypertension: Secondary | ICD-10-CM | POA: Diagnosis not present

## 2017-12-27 ENCOUNTER — Other Ambulatory Visit: Payer: Self-pay | Admitting: Internal Medicine

## 2018-01-01 ENCOUNTER — Other Ambulatory Visit: Payer: Self-pay | Admitting: Internal Medicine

## 2018-01-10 ENCOUNTER — Encounter (INDEPENDENT_AMBULATORY_CARE_PROVIDER_SITE_OTHER): Payer: Medicare Other | Admitting: Ophthalmology

## 2018-01-17 ENCOUNTER — Encounter (INDEPENDENT_AMBULATORY_CARE_PROVIDER_SITE_OTHER): Payer: Medicare Other | Admitting: Ophthalmology

## 2018-01-17 DIAGNOSIS — I10 Essential (primary) hypertension: Secondary | ICD-10-CM | POA: Diagnosis not present

## 2018-01-17 DIAGNOSIS — H35033 Hypertensive retinopathy, bilateral: Secondary | ICD-10-CM

## 2018-01-17 DIAGNOSIS — H43813 Vitreous degeneration, bilateral: Secondary | ICD-10-CM | POA: Diagnosis not present

## 2018-01-17 DIAGNOSIS — E113313 Type 2 diabetes mellitus with moderate nonproliferative diabetic retinopathy with macular edema, bilateral: Secondary | ICD-10-CM

## 2018-01-17 DIAGNOSIS — E11311 Type 2 diabetes mellitus with unspecified diabetic retinopathy with macular edema: Secondary | ICD-10-CM | POA: Diagnosis not present

## 2018-02-04 ENCOUNTER — Other Ambulatory Visit: Payer: Self-pay

## 2018-02-04 NOTE — Patient Outreach (Signed)
[facility] [facility]) Care Management  02/04/2018  Aaron Mckenzie February 21, 1961 [id number]   Medication Adherence call to Aaron Mckenzie spoke with patient he is due on Atorvastatin 80 mg patient said he has plenty at this time and does not need any at this time. Aaron Mckenzie is showing past due under [place].   [place] Management Direct Dial [phone]  Fax [phone] [email or web address]

## 2018-02-13 DIAGNOSIS — N183 Chronic kidney disease, stage 3 (moderate): Secondary | ICD-10-CM | POA: Diagnosis not present

## 2018-02-13 DIAGNOSIS — R609 Edema, unspecified: Secondary | ICD-10-CM | POA: Diagnosis not present

## 2018-02-13 DIAGNOSIS — E1122 Type 2 diabetes mellitus with diabetic chronic kidney disease: Secondary | ICD-10-CM | POA: Diagnosis not present

## 2018-02-13 DIAGNOSIS — R809 Proteinuria, unspecified: Secondary | ICD-10-CM | POA: Diagnosis not present

## 2018-02-13 DIAGNOSIS — I129 Hypertensive chronic kidney disease with stage 1 through stage 4 chronic kidney disease, or unspecified chronic kidney disease: Secondary | ICD-10-CM | POA: Diagnosis not present

## 2018-02-14 ENCOUNTER — Encounter (INDEPENDENT_AMBULATORY_CARE_PROVIDER_SITE_OTHER): Payer: Medicare Other | Admitting: Ophthalmology

## 2018-02-14 DIAGNOSIS — E113313 Type 2 diabetes mellitus with moderate nonproliferative diabetic retinopathy with macular edema, bilateral: Secondary | ICD-10-CM

## 2018-02-14 DIAGNOSIS — H35033 Hypertensive retinopathy, bilateral: Secondary | ICD-10-CM

## 2018-02-14 DIAGNOSIS — E11311 Type 2 diabetes mellitus with unspecified diabetic retinopathy with macular edema: Secondary | ICD-10-CM | POA: Diagnosis not present

## 2018-02-14 DIAGNOSIS — I10 Essential (primary) hypertension: Secondary | ICD-10-CM | POA: Diagnosis not present

## 2018-02-14 DIAGNOSIS — H43813 Vitreous degeneration, bilateral: Secondary | ICD-10-CM

## 2018-03-04 ENCOUNTER — Encounter: Payer: Self-pay | Admitting: Internal Medicine

## 2018-03-04 ENCOUNTER — Ambulatory Visit (INDEPENDENT_AMBULATORY_CARE_PROVIDER_SITE_OTHER): Payer: Medicare Other | Admitting: Internal Medicine

## 2018-03-04 VITALS — BP 130/72 | HR 87 | Ht 69.0 in | Wt 246.0 lb

## 2018-03-04 DIAGNOSIS — E1165 Type 2 diabetes mellitus with hyperglycemia: Secondary | ICD-10-CM | POA: Diagnosis not present

## 2018-03-04 DIAGNOSIS — E785 Hyperlipidemia, unspecified: Secondary | ICD-10-CM

## 2018-03-04 DIAGNOSIS — Z794 Long term (current) use of insulin: Secondary | ICD-10-CM

## 2018-03-04 DIAGNOSIS — IMO0002 Reserved for concepts with insufficient information to code with codable children: Secondary | ICD-10-CM

## 2018-03-04 DIAGNOSIS — E1142 Type 2 diabetes mellitus with diabetic polyneuropathy: Secondary | ICD-10-CM

## 2018-03-04 LAB — POCT GLYCOSYLATED HEMOGLOBIN (HGB A1C): HEMOGLOBIN A1C: 8.5 % — AB (ref 4.0–5.6)

## 2018-03-04 MED ORDER — EMPAGLIFLOZIN 10 MG PO TABS: 10.0000 mg | ORAL_TABLET | Freq: Every day | ORAL | 5 refills | Status: DC

## 2018-03-04 NOTE — Progress Notes (Signed)
Patient ID: Aaron Mckenzie, male   DOB: [birth year], 58 y.o.   MRN: [id number]  HPI: Aaron Mckenzie is a 58 y.o.-year-old male, returning for f/u for DM2, dx 2004, insulin-dependent since 12/2012, uncontrolled, with complications (CKD, neuropathy, DR). Last visit 3.5 months ago.  When I saw him last, he just had a STEMI 11/09/2017.  Of note, he did not have chest pain with his STEMI, just cough and nausea.  He is now doing better.  At last visit, he was just discharged from the hospital and was started on Levemir without regular insulin.  He could not afford Levemir.  Sugars were much higher.  He had a foot ulcer and sees Dr. Thresa Ross at the [facility]. Now healed.  Last hemoglobin A1c was:  Lab Results  Component Value Date   HGBA1C 9.0 (H) 11/10/2017   HGBA1C 9.3 (A) 07/16/2017   HGBA1C 11.5 04/19/2017  04/19/2017: The HbA1c calculated from fructosamine is much better: 7.8%! 01/16/2017: 10.2%  Pt was on: U500 insulin: - 20 units before a smaller meal - 22 units before a larger meal. Sliding scale of insulin: - 150-175: + 1 unit  - 176-200: + 2 units  - 201-225: + 3 units  - > 226: + 4 units  We stopped Glipizide 5 mg bid when we started mealtime insulin We had to stop Metformin 2/2 decreased kidney fxn.  He was then on: N and R insulin (ReliOn): Insulin Before brunch Before dinner  Regular 50 40  NPH 60  50    He was on: - 70/30 insulin - but actually using only R insulin!!!! - 50 units before b'fast - 70 units before dinner - Jardiance 10 mg before b'fast - started 03/2017  He was then on: N and R insulin (ReliOn): Insulin Before brunch Before dinner  Regular (clear) 30 >> 15 40 >> 25  NPH (cloudy) 30 >> 15 40 >> 25   -  >> stopped b/c AKI  Since hospitalization: (!!) - Levemir 15 units in am and 20 units at bedtime  At last visit, I again suggested to start:  N and R insulin (ReliOn): Insulin Before brunch Before dinner  Regular (clear)  15  15  >> 25   NPH (cloudy)  15  25   -  >> did not start b/c fear of SEs!  Pt checks sugars 2x a day: - am:  170-218 >> 200-240 >> 197-269, 316 >> 123, 166-214, 243, 251 - before lunch: 131, 170 >> n/c >> 223 >> n/c - 2h after lunch: 400, 536 >> n/c  >> 197 >> n/c - before dinner:   107-122, 138 >> 100-120 >> n/c - 2h after dinner: 300s >> n/c  - bedtime: 53, 68, 120, 154 >> 180-190 >> n/c >> 201-324 >> 131, 151-188, 219 Lowest: 197 >> 127. Highest: 324 >> 367.  Pt's meals are: - Breakfast: eggs, meat, grits, wheat toast - Lunch: may skip or eat late b'fast  - Dinner: chicken/fish/pork chops, 2 sides, salad, baked potatoes - Snacks: 2x a day - apple slices, popcorn  -+ CKD, last BUN/creatinine:  11/21/2017: GFR 46 - at the appt with nephrology - Dr. Juleen China He has a history of AKI due to contrast substance. Lab Results  Component Value Date   BUN 53 (H) 11/14/2017   BUN 59 (H) 11/14/2017   CREATININE 3.07 (H) 11/14/2017   CREATININE 3.08 (H) 11/14/2017  11/29/2016: BUN/Cr 17/1.31, GFR 60, Glu 484  + Proteinuria: 11/29/2016: 48,250  mg/g  On lisinopril.  -+ HL; reviewed latest 3 sets of lipids: Lab Results  Component Value Date   CHOL 155 11/10/2017   CHOL 175 03/05/2017   CHOL 121 02/15/2016   Lab Results  Component Value Date   HDL 19 (L) 11/10/2017   HDL 27.50 (L) 03/05/2017   HDL 22.20 (L) 02/15/2016   Lab Results  Component Value Date   LDLCALC 91 11/10/2017   Lab Results  Component Value Date   TRIG 223 (H) 11/10/2017   TRIG (H) 03/05/2017    577.0 Triglyceride is over 400; calculations on Lipids are invalid.   TRIG 301.0 (H) 02/15/2016   Lab Results  Component Value Date   CHOLHDL 8.2 11/10/2017   CHOLHDL 6 03/05/2017   CHOLHDL 5 02/15/2016   Lab Results  Component Value Date   LDLDIRECT 76.0 03/05/2017   LDLDIRECT 55.0 02/15/2016   LDLDIRECT 122.0 01/29/2015   On pravastatin.  On ASA 81 >> 325, also on Plavix.  Also on a beta-blocker.  - last eye  exam was in 01/2017: + DR. He saw Dr. Katy Fitch >> had cataract sx 08/2016.  He is on intraocular injections and also on drops for glaucoma.  -He has numbness and tingling in his feet and hands.  He is now off Neurontin.  He also has a history of HTN, pseudogout, bilateral carpal tunnel s/p sx in each hand: 1985 and 2000.  ROS: Constitutional: + weight gain/no weight loss, + fatigue, no subjective hyperthermia, no subjective hypothermia Eyes: no blurry vision, no xerophthalmia ENT: no sore throat, no nodules palpated in neck, no dysphagia, no odynophagia, no hoarseness Cardiovascular: no CP/no SOB/no palpitations/+ leg swelling Respiratory:+ cough/no SOB/no wheezing Gastrointestinal: no N/no V/no D/no C/no acid reflux Musculoskeletal: no muscle aches/no joint aches Skin: no rashes, no hair loss Neurological: no tremors/no numbness/no tingling/no dizziness  I reviewed pt's medications, allergies, PMH, social hx, family hx, and changes were documented in the history of present illness. Otherwise, unchanged from my initial visit note.  Past Medical History:  Diagnosis Date  . Diabetes mellitus   . Ectodermal dysplasia   . Hyperlipidemia   . Hypertension   . Pseudogout   . Type 2 diabetes mellitus with neurological manifestations, uncontrolled ([place]) 04/23/2008   Qualifier: Diagnosis of  By: Silvio Pate MD, [facility]    Past Surgical History:  Procedure Laterality Date  . APPENDECTOMY    . CATARACT EXTRACTION Left 07/2016  . CORONARY BALLOON ANGIOPLASTY N/A 11/09/2017   Procedure: CORONARY BALLOON ANGIOPLASTY;  Surgeon: Yolonda Kida, MD;  Location: [place] [facility];  Service: Cardiovascular;  Laterality: N/A;  . LEFT HEART CATH AND CORONARY ANGIOGRAPHY N/A 11/09/2017   Procedure: LEFT HEART CATH AND CORONARY ANGIOGRAPHY;  Surgeon: Yolonda Kida, MD;  Location: [place] [facility];  Service: Cardiovascular;  Laterality: N/A;  . RIB FRACTURE SURGERY  1988   right   Social  History   Socioeconomic History  . Marital status: Married    Spouse name: Not on file  . Number of children: Not on file  . Years of education: Not on file  . Highest education level: Not on file  Occupational History  . Occupation: Retired after disability    Comment: [place] at [employer]  . Financial resource strain: Not on file  . Food insecurity:    Worry: Not on file    Inability: Not on file  . Transportation needs:    Medical: Not on  file    Non-medical: Not on file  Tobacco Use  . Smoking status: Never Smoker  . Smokeless tobacco: Never Used  Substance and Sexual Activity  . Alcohol use: No  . Drug use: No  . Sexual activity: Yes  Lifestyle  . Physical activity:    Days per week: Not on file    Minutes per session: Not on file  . Stress: Not on file  Relationships  . Social connections:    Talks on phone: Not on file    Gets together: Not on file    Attends religious service: Not on file    Active member of club or organization: Not on file    Attends meetings of clubs or organizations: Not on file    Relationship status: Not on file  . Intimate partner violence:    Fear of current or ex partner: Not on file    Emotionally abused: Not on file    Physically abused: Not on file    Forced sexual activity: Not on file  Other Topics Concern  . Not on file  Social History Narrative   No living will or health care POA   Would want wife to make medical decisions for him--alternate is brother   Would accept CPR but no prolonged ventilation   Not sure about tube feeds   Current Outpatient Medications on File Prior to Visit  Medication Sig Dispense Refill  . ACCU-CHEK SOFTCLIX LANCETS lancets Use as instructed to check blood sugars twice daily. 200 each 3  . aspirin EC 325 MG EC tablet Take 1 tablet (325 mg total) by mouth daily. 30 tablet 0  . atorvastatin (LIPITOR) 80 MG tablet TAKE 1 TABLET BY MOUTH EVERY DAY AT 6 PM 30 tablet 11  . Blood  Glucose Monitoring Suppl (ACCU-CHEK GUIDE) w/Device KIT 1 each by Does not apply route 2 (two) times daily. 1 kit 0  . brimonidine (ALPHAGAN P) 0.1 % SOLN Place 1 drop into both eyes 2 (two) times daily.     . carvedilol (COREG) 3.125 MG tablet TAKE 1 TABLET BY MOUTH TWICE DAILY WITH A MEAL 60 tablet 11  . dorzolamide-timolol (COSOPT) 22.3-6.8 MG/ML ophthalmic solution Place 1 drop into both eyes 2 (two) times daily.     . furosemide (LASIX) 40 MG tablet Take 1 tablet (40 mg total) by mouth daily. (Patient taking differently: Take by mouth daily. ) 30 tablet 0  . glucose blood (ACCU-CHEK GUIDE) test strip Use as instructed to check blood sugars twice daily. 200 each 3  . guaiFENesin-codeine 100-10 MG/5ML syrup Take 5 mLs by mouth every 6 (six) hours as needed for cough. (Patient not taking: Reported on 11/26/2017) 120 mL 0  . hydrALAZINE (APRESOLINE) 25 MG tablet Take 25 mg by mouth 3 (three) times daily.    . insulin NPH Human (HUMULIN N) 100 UNIT/ML injection Inject up to 40 units a day, as prescribed 20 mL 11  . insulin regular (HUMULIN R) 100 units/mL injection Inject up to 50 units a day as prescribed 20 mL 11  . INSULIN SYRINGE .5CC/29G 29G X 1/2" 0.5 ML MISC USE TWICE DAILY 100 each 0  . INSULIN SYRINGE .5CC/29G 29G X 1/2" 0.5 ML MISC USE TWICE DAILY 100 each 0  . isosorbide mononitrate (IMDUR) 30 MG 24 hr tablet Take 30 mg by mouth daily.    . urea (CARMOL) 10 % cream APPLY TOPICALLY DAILY AS NEEDED FOR FEET 85 g 5   No current facility-administered  medications on file prior to visit.    No Known Allergies Family History  Problem Relation Age of Onset  . Arthritis Mother   . Arthritis Father     PE: BP 130/72   Pulse 87   Ht _0  (1.753 m)   Wt 246 lb (111.6 kg)   SpO2 96%   BMI 36.33 kg/m  Body mass index is 36.33 kg/m. Wt Readings from Last 3 Encounters:  03/04/18 246 lb (111.6 kg)  11/26/17 235 lb (106.6 kg)  11/14/17 238 lb 6.4 oz (108.1 kg)   Constitutional:  overweight, in NAD Eyes: PERRLA, EOMI, no exophthalmos ENT: moist mucous membranes, no thyromegaly, no cervical lymphadenopathy Cardiovascular: RRR, No MRG, + bilateral LE edema, pitting Respiratory: CTA B Gastrointestinal: abdomen soft, NT, ND, BS+ Musculoskeletal: no deformities, strength intact in all 4 Skin: moist, warm, no rashes Neurological: no tremor with outstretched hands, DTR normal in all 4  ASSESSMENT: 1. DM2, insulin-dependent, uncontrolled, with complications - increased insulin resistance - Cardiovascular disease- s/p STEMI 11/09/2017 - PN  -stable - CKD  2. HL  3.  Obesity  PLAN:  1. Patient with longstanding, uncontrolled, type 2 diabetes, with high insulin resistance, previously on U5 100 insulin, but now with improving blood sugars.  His sugars remain high, but, per review of his log, they are definitely improved before dinner.  He did not start Jardiance for fear of side effects.  I again recommended that he started it and we discussed about the benefit/risk of the medication.  I will give him 2 different regimens, one with Jardiance and one without.  If we do not start Jardiance, I advised him to increase regular and NPH insulin doses in the morning and also NPH at night to hopefully improve the sugars in the morning. -We discussed about the possibility of using lower regular insulin doses before his exercise sessions in physical therapy, but he is not dropping his sugars after the sessions, so for now, we will continue the same doses. - I advised him to:  Patient Instructions   Please continue:  N and R insulin (ReliOn): Insulin Before brunch Before dinner  Regular (clear)  15  25  NPH (cloudy)  15  25   Try to add: - Jardiance 10 mg before b'fast  If you cannot add Jardiance, please change the regimen as follows:  N and R insulin (ReliOn): Insulin Before brunch Before dinner  Regular (clear)  20  25  NPH (cloudy)  20  30   Please return in 3 months  with your sugar log.   - today, HbA1c is 8.5% (better, but still above goal) - continue checking sugars at different times of the day - check 2x a day, rotating checks - advised for yearly eye exams >> he is UTD - Return to clinic in 3 mo with sugar log     2. HL - Reviewed latest lipid panel from 10/2017: LDL above goal in the setting of cardiovascular disease, HDL very low, triglycerides high but much improved Lab Results  Component Value Date   CHOL 155 11/10/2017   HDL 19 (L) 11/10/2017   LDLCALC 91 11/10/2017   LDLDIRECT 76.0 03/05/2017   TRIG 223 (H) 11/10/2017   CHOLHDL 8.2 11/10/2017  - Continues pravastatin without side effects.  3.  Obesity -At last visit, I advised him to restart Jardiance, which should also help with weight loss >> he did not for fear of "flesh eating bacterial inf" - at this  visit, I discussed with him and his wife that these side effects are extremely rare, and he will greatly benefit from the medication if he decides to take it.  I explained that this medication will help with diabetes, his heart, his kidneys, and also his weight.  He decided to give it a try.  I printed him a prescription for the lower dose, 10 mg, due to his decrease GFR.  Philemon Kingdom, MD PhD [facility]

## 2018-03-04 NOTE — Patient Instructions (Addendum)
Please continue:  N and R insulin (ReliOn): Insulin Before brunch Before dinner  Regular (clear)  15  25  NPH (cloudy)  15  25   Try to add: - Jardiance 10 mg before b'fast  If you cannot add Jardiance, please change the regimen as follows:  N and R insulin (ReliOn): Insulin Before brunch Before dinner  Regular (clear)  20  25  NPH (cloudy)  20  30   Please return in 3 months with your sugar log.

## 2018-03-04 NOTE — Addendum Note (Signed)
Addended by: Cardell Peach I on: 03/04/2018 12:54 PM   Modules accepted: Orders

## 2018-03-05 DIAGNOSIS — B351 Tinea unguium: Secondary | ICD-10-CM | POA: Diagnosis not present

## 2018-03-05 DIAGNOSIS — E1140 Type 2 diabetes mellitus with diabetic neuropathy, unspecified: Secondary | ICD-10-CM | POA: Diagnosis not present

## 2018-03-05 DIAGNOSIS — Z794 Long term (current) use of insulin: Secondary | ICD-10-CM | POA: Diagnosis not present

## 2018-03-12 ENCOUNTER — Ambulatory Visit (INDEPENDENT_AMBULATORY_CARE_PROVIDER_SITE_OTHER): Payer: Medicare Other | Admitting: Internal Medicine

## 2018-03-12 ENCOUNTER — Encounter: Payer: Self-pay | Admitting: Internal Medicine

## 2018-03-12 VITALS — BP 112/70 | HR 76 | Temp 98.2°F | Resp 18 | Ht 69.0 in | Wt 244.0 lb

## 2018-03-12 DIAGNOSIS — E11319 Type 2 diabetes mellitus with unspecified diabetic retinopathy without macular edema: Secondary | ICD-10-CM

## 2018-03-12 DIAGNOSIS — E1142 Type 2 diabetes mellitus with diabetic polyneuropathy: Secondary | ICD-10-CM

## 2018-03-12 DIAGNOSIS — Z0000 Encounter for general adult medical examination without abnormal findings: Secondary | ICD-10-CM

## 2018-03-12 DIAGNOSIS — I10 Essential (primary) hypertension: Secondary | ICD-10-CM | POA: Diagnosis not present

## 2018-03-12 DIAGNOSIS — Z1211 Encounter for screening for malignant neoplasm of colon: Secondary | ICD-10-CM

## 2018-03-12 DIAGNOSIS — I25119 Atherosclerotic heart disease of native coronary artery with unspecified angina pectoris: Secondary | ICD-10-CM | POA: Diagnosis not present

## 2018-03-12 DIAGNOSIS — IMO0002 Reserved for concepts with insufficient information to code with codable children: Secondary | ICD-10-CM

## 2018-03-12 DIAGNOSIS — Z794 Long term (current) use of insulin: Secondary | ICD-10-CM

## 2018-03-12 DIAGNOSIS — N184 Chronic kidney disease, stage 4 (severe): Secondary | ICD-10-CM | POA: Diagnosis not present

## 2018-03-12 DIAGNOSIS — Z7189 Other specified counseling: Secondary | ICD-10-CM

## 2018-03-12 DIAGNOSIS — E1165 Type 2 diabetes mellitus with hyperglycemia: Secondary | ICD-10-CM

## 2018-03-12 LAB — HM DIABETES FOOT EXAM

## 2018-03-12 NOTE — Assessment & Plan Note (Signed)
I have personally reviewed the Medicare Annual Wellness questionnaire and have noted 1. The patient's medical and social history 2. Their use of alcohol, tobacco or illicit drugs 3. Their current medications and supplements 4. The patient's functional ability including ADL's, fall risks, home safety risks and hearing or visual             impairment. 5. Diet and physical activities 6. Evidence for depression or mood disorders  The patients weight, height, BMI and visual acuity have been recorded in the chart I have made referrals, counseling and provided education to the patient based review of the above and I have provided the pt with a written personalized care plan for preventive services.  I have provided you with a copy of your personalized plan for preventive services. Please take the time to review along with your updated medication list.  Will consider shingrix Yearly flu vaccine Defer PSA Will do FIT Discussed exercise

## 2018-03-12 NOTE — Assessment & Plan Note (Signed)
Recent MI/cath DOE but no pain on isosorbide

## 2018-03-12 NOTE — Assessment & Plan Note (Signed)
BP Readings from Last 3 Encounters:  03/12/18 112/70  03/04/18 130/72  11/26/17 (!) 150/90   Good control

## 2018-03-12 NOTE — Assessment & Plan Note (Signed)
Control has improved Gabapentin for neuropathy Follows with Dr Cruzita Lederer

## 2018-03-12 NOTE — Progress Notes (Signed)
Hearing Screening   Method: Audiometry   125Hz  250Hz  500Hz  1000Hz  2000Hz  3000Hz  4000Hz  6000Hz  8000Hz   Right ear:   20 20 20   0    Left ear:   20 20 20  20     Vision Screening Comments: March 2019

## 2018-03-12 NOTE — Assessment & Plan Note (Signed)
Getting injections for this

## 2018-03-12 NOTE — Assessment & Plan Note (Signed)
See social history 

## 2018-03-12 NOTE — Progress Notes (Signed)
Subjective:    Patient ID: Aaron Mckenzie, male    DOB: 1960/03/31, 58 y.o.   MRN: [id number]  HPI Here for Medicare wellness visit and follow up of chronic health conditions Reviewed form and advanced directives Reviewed other doctors No alcohol or tobacco Vision is not great--getting Rx Hearing okay No exercise No falls No depression or anhedonia Memory has been fine  Has had some cough for 8-10 days Not really sick Some post nasal drip---stuck in throat No fever No SOB  Recovered from MI Had cath then No chest pain now Did have some DOE---walking from parking lot Acknowledges that he doesn't do any exercise No dizziness or syncope Mild edema only  Known CKD 4 Continues with Dr Juleen China  Diabetes control is better--but still not great A1c down from 11-8.5% Feet still numb--some pain/burning as well  Getting shots for diabetic retinopathy  Current Outpatient Medications on File Prior to Visit  Medication Sig Dispense Refill  . ACCU-CHEK SOFTCLIX LANCETS lancets Use as instructed to check blood sugars twice daily. 200 each 3  . aspirin EC 325 MG EC tablet Take 1 tablet (325 mg total) by mouth daily. 30 tablet 0  . atorvastatin (LIPITOR) 80 MG tablet TAKE 1 TABLET BY MOUTH EVERY DAY AT 6 PM 30 tablet 11  . Blood Glucose Monitoring Suppl (ACCU-CHEK GUIDE) w/Device KIT 1 each by Does not apply route 2 (two) times daily. 1 kit 0  . brimonidine (ALPHAGAN P) 0.1 % SOLN Place 1 drop into both eyes 2 (two) times daily.     . carvedilol (COREG) 3.125 MG tablet TAKE 1 TABLET BY MOUTH TWICE DAILY WITH A MEAL 60 tablet 11  . dorzolamide-timolol (COSOPT) 22.3-6.8 MG/ML ophthalmic solution Place 1 drop into both eyes 2 (two) times daily.     . empagliflozin (JARDIANCE) 10 MG TABS tablet Take 10 mg by mouth daily. 30 tablet 5  . furosemide (LASIX) 40 MG tablet Take 1 tablet (40 mg total) by mouth daily. (Patient taking differently: Take by mouth daily. ) 30 tablet 0  . glucose  blood (ACCU-CHEK GUIDE) test strip Use as instructed to check blood sugars twice daily. 200 each 3  . hydrALAZINE (APRESOLINE) 25 MG tablet Take 25 mg by mouth 3 (three) times daily.    . insulin NPH Human (HUMULIN N) 100 UNIT/ML injection Inject up to 40 units a day, as prescribed 20 mL 11  . insulin regular (HUMULIN R) 100 units/mL injection Inject up to 50 units a day as prescribed 20 mL 11  . INSULIN SYRINGE .5CC/29G 29G X 1/2" 0.5 ML MISC USE TWICE DAILY 100 each 0  . isosorbide mononitrate (IMDUR) 30 MG 24 hr tablet Take 30 mg by mouth daily.    . urea (CARMOL) 10 % cream APPLY TOPICALLY DAILY AS NEEDED FOR FEET 85 g 5   No current facility-administered medications on file prior to visit.     No Known Allergies  Past Medical History:  Diagnosis Date  . Atherosclerotic heart disease of native coronary artery with angina pectoris ([place])   . Diabetes mellitus   . Ectodermal dysplasia   . Hyperlipidemia   . Hypertension   . Pseudogout   . Type 2 diabetes mellitus with neurological manifestations, uncontrolled ([place]) 04/23/2008   Qualifier: Diagnosis of  By: Silvio Pate MD, [facility]     Past Surgical History:  Procedure Laterality Date  . APPENDECTOMY    . CATARACT EXTRACTION Left 07/2016  . CORONARY BALLOON ANGIOPLASTY N/A  11/09/2017   Procedure: CORONARY BALLOON ANGIOPLASTY;  Surgeon: Yolonda Kida, MD;  Location: [place] [facility];  Service: Cardiovascular;  Laterality: N/A;  . LEFT HEART CATH AND CORONARY ANGIOGRAPHY N/A 11/09/2017   Procedure: LEFT HEART CATH AND CORONARY ANGIOGRAPHY;  Surgeon: Yolonda Kida, MD;  Location: [place] [facility];  Service: Cardiovascular;  Laterality: N/A;  . RIB FRACTURE SURGERY  1988   right    Family History  Problem Relation Age of Onset  . Arthritis Mother   . Arthritis Father     Social History   Socioeconomic History  . Marital status: Married    Spouse name: Not on file  . Number of children: Not on file  . Years of  education: Not on file  . Highest education level: Not on file  Occupational History  . Occupation: Retired after disability    Comment: [place] at [employer]  . Financial resource strain: Not on file  . Food insecurity:    Worry: Not on file    Inability: Not on file  . Transportation needs:    Medical: Not on file    Non-medical: Not on file  Tobacco Use  . Smoking status: Never Smoker  . Smokeless tobacco: Never Used  Substance and Sexual Activity  . Alcohol use: No  . Drug use: No  . Sexual activity: Yes  Lifestyle  . Physical activity:    Days per week: Not on file    Minutes per session: Not on file  . Stress: Not on file  Relationships  . Social connections:    Talks on phone: Not on file    Gets together: Not on file    Attends religious service: Not on file    Active member of club or organization: Not on file    Attends meetings of clubs or organizations: Not on file    Relationship status: Not on file  . Intimate partner violence:    Fear of current or ex partner: Not on file    Emotionally abused: Not on file    Physically abused: Not on file    Forced sexual activity: Not on file  Other Topics Concern  . Not on file  Social History Narrative   No living will or health care POA   Would want wife to make medical decisions for him--alternate is brother   Would accept CPR but no prolonged ventilation   Not sure about tube feeds   Review of Systems Appetite is okay Had lost weight in hospital--but gained it back Restless sleeper--up and down Wears seat belt Edentulous--- wears upper plate. No dentist now No rash or suspicious lesions. Dry skin on calves Bowels are okay--occasional blood on toilet paper (better off ibuprofen) No heartburn. Some swallowing problems Voids with decreased pressure now--has to sit all the time. Nocturia x 1-2 Some dribbling---seems to empty okay Still with pain in hands. No back pain    Objective:    Physical Exam  Constitutional: He is oriented to person, place, and time. He appears well-developed. No distress.  HENT:  Mouth/Throat: Oropharynx is clear and moist. No oropharyngeal exudate.  Neck: No thyromegaly present.  Cardiovascular: Normal rate, regular rhythm, normal heart sounds and intact distal pulses. Exam reveals no gallop.  No murmur heard. Respiratory: Effort normal and breath sounds normal. No respiratory distress. He has no wheezes. He has no rales.  GI: Soft. There is no abdominal tenderness.  Musculoskeletal:  General: No tenderness.     Comments: 2-3+ tense edema in feet/calves  Lymphadenopathy:    He has no cervical adenopathy.  Neurological: He is alert and oriented to person, place, and time.  President--- "Daisy Floro, Barack Quay Burow" [phone] [id number] Recall 3/3  Decreased sensation in feet  Skin: No rash noted. No erythema.  No foot ulcers. Hypertrophic areas on calves and feet  Psychiatric: He has a normal mood and affect. His behavior is normal.           Assessment & Plan:

## 2018-03-12 NOTE — Assessment & Plan Note (Signed)
Follows with Dr Juleen China

## 2018-03-14 ENCOUNTER — Encounter (INDEPENDENT_AMBULATORY_CARE_PROVIDER_SITE_OTHER): Payer: Medicare Other | Admitting: Ophthalmology

## 2018-03-20 ENCOUNTER — Encounter (INDEPENDENT_AMBULATORY_CARE_PROVIDER_SITE_OTHER): Payer: Medicare Other | Admitting: Ophthalmology

## 2018-03-20 DIAGNOSIS — H35033 Hypertensive retinopathy, bilateral: Secondary | ICD-10-CM | POA: Diagnosis not present

## 2018-03-20 DIAGNOSIS — I10 Essential (primary) hypertension: Secondary | ICD-10-CM | POA: Diagnosis not present

## 2018-03-20 DIAGNOSIS — H43813 Vitreous degeneration, bilateral: Secondary | ICD-10-CM | POA: Diagnosis not present

## 2018-03-20 DIAGNOSIS — E11311 Type 2 diabetes mellitus with unspecified diabetic retinopathy with macular edema: Secondary | ICD-10-CM | POA: Diagnosis not present

## 2018-03-20 DIAGNOSIS — E113313 Type 2 diabetes mellitus with moderate nonproliferative diabetic retinopathy with macular edema, bilateral: Secondary | ICD-10-CM

## 2018-03-27 DIAGNOSIS — I10 Essential (primary) hypertension: Secondary | ICD-10-CM | POA: Diagnosis not present

## 2018-03-27 DIAGNOSIS — I482 Chronic atrial fibrillation, unspecified: Secondary | ICD-10-CM | POA: Diagnosis not present

## 2018-03-27 DIAGNOSIS — I2510 Atherosclerotic heart disease of native coronary artery without angina pectoris: Secondary | ICD-10-CM | POA: Diagnosis not present

## 2018-03-27 DIAGNOSIS — R0609 Other forms of dyspnea: Secondary | ICD-10-CM | POA: Diagnosis not present

## 2018-03-27 DIAGNOSIS — E782 Mixed hyperlipidemia: Secondary | ICD-10-CM | POA: Diagnosis not present

## 2018-04-05 ENCOUNTER — Other Ambulatory Visit (INDEPENDENT_AMBULATORY_CARE_PROVIDER_SITE_OTHER): Payer: Medicare Other

## 2018-04-05 ENCOUNTER — Telehealth: Payer: Self-pay | Admitting: Radiology

## 2018-04-05 DIAGNOSIS — R195 Other fecal abnormalities: Secondary | ICD-10-CM

## 2018-04-05 DIAGNOSIS — Z1211 Encounter for screening for malignant neoplasm of colon: Secondary | ICD-10-CM

## 2018-04-05 LAB — FECAL OCCULT BLOOD, IMMUNOCHEMICAL: FECAL OCCULT BLD: POSITIVE — AB

## 2018-04-05 NOTE — Telephone Encounter (Signed)
[facility] called a critical result, POSITIVE ifob, results sent to Dr Silvio Pate

## 2018-04-06 NOTE — Telephone Encounter (Signed)
Please let him know the fecal test showed some blood so he needs to follow up with a colonoscopy. He should hear from our GI office sometime soon about setting up this appointment (have him let me know if we need to change to [facility] for this)

## 2018-04-08 ENCOUNTER — Encounter: Payer: Self-pay | Admitting: Internal Medicine

## 2018-04-08 ENCOUNTER — Other Ambulatory Visit: Payer: Self-pay

## 2018-04-08 NOTE — Telephone Encounter (Signed)
Spoke to pt

## 2018-04-08 NOTE — Telephone Encounter (Signed)
New referral place for [facility]

## 2018-04-08 NOTE — Telephone Encounter (Signed)
Spoke to pt. [facility] already called him this morning. He would prefer [facility].

## 2018-04-08 NOTE — Addendum Note (Signed)
Addended by: Viviana Simpler I on: 04/08/2018 10:38 AM   Modules accepted: Orders

## 2018-04-17 ENCOUNTER — Encounter (INDEPENDENT_AMBULATORY_CARE_PROVIDER_SITE_OTHER): Payer: Medicare Other | Admitting: Ophthalmology

## 2018-04-17 DIAGNOSIS — E11311 Type 2 diabetes mellitus with unspecified diabetic retinopathy with macular edema: Secondary | ICD-10-CM

## 2018-04-17 DIAGNOSIS — H35033 Hypertensive retinopathy, bilateral: Secondary | ICD-10-CM | POA: Diagnosis not present

## 2018-04-17 DIAGNOSIS — I10 Essential (primary) hypertension: Secondary | ICD-10-CM

## 2018-04-17 DIAGNOSIS — E113313 Type 2 diabetes mellitus with moderate nonproliferative diabetic retinopathy with macular edema, bilateral: Secondary | ICD-10-CM

## 2018-04-17 DIAGNOSIS — H43813 Vitreous degeneration, bilateral: Secondary | ICD-10-CM | POA: Diagnosis not present

## 2018-04-24 ENCOUNTER — Ambulatory Visit: Payer: Medicare Other | Admitting: Internal Medicine

## 2018-04-25 ENCOUNTER — Ambulatory Visit: Payer: Medicare Other | Admitting: Gastroenterology

## 2018-04-25 ENCOUNTER — Other Ambulatory Visit: Payer: Self-pay

## 2018-04-25 ENCOUNTER — Encounter: Payer: Self-pay | Admitting: Gastroenterology

## 2018-04-25 VITALS — BP 160/97 | HR 84 | Ht 69.0 in | Wt 248.4 lb

## 2018-04-25 DIAGNOSIS — R1310 Dysphagia, unspecified: Secondary | ICD-10-CM

## 2018-04-25 DIAGNOSIS — R195 Other fecal abnormalities: Secondary | ICD-10-CM | POA: Diagnosis not present

## 2018-04-25 NOTE — Progress Notes (Signed)
Vonda Antigua [address]  [place], [place] [address]  Main: [phone]  Fax: [phone]   Gastroenterology Consultation  Referring Provider:     Venia Carbon, MD Primary Care Physician:  Venia Carbon, MD Reason for Consultation:     Positive FOBT        HPI:    Chief Complaint  Patient presents with  . New Patient (Initial Visit)    positive fecal test    Aaron Mckenzie is a 58 y.o. y/o male referred for consultation & management  by Dr. Silvio Pate, Theophilus Kinds, MD.  Patient presents today to discuss further work-up for positive FOBT.  He has previously had fecal occult blood test once a year which has been negative until the one done recently.  Denies any previous colonoscopies.  No family history of colon cancer.  Reports an episode of bright blood per rectum about a year ago.  States was just blood streaks in the stool and self resolved and he did not seek medical attention.  Denies any weight loss.  On further investigation, he also reports that if he eats too fast, food will get stuck in his mid chest and he coughs it back up.  Last time this episode occurred was about 1 to 2 months ago.  As long as he is eat small quantities and chews his food well this does not occur.  He has never had an EGD either.  Past Medical History:  Diagnosis Date  . Atherosclerotic heart disease of native coronary artery with angina pectoris ([place])   . Diabetes mellitus   . Ectodermal dysplasia   . Hyperlipidemia   . Hypertension   . Pseudogout   . Type 2 diabetes mellitus with neurological manifestations, uncontrolled ([place]) 04/23/2008   Qualifier: Diagnosis of  By: Silvio Pate MD, [facility]     Past Surgical History:  Procedure Laterality Date  . APPENDECTOMY    . CATARACT EXTRACTION Left 07/2016  . CORONARY BALLOON ANGIOPLASTY N/A 11/09/2017   Procedure: CORONARY BALLOON ANGIOPLASTY;  Surgeon: Yolonda Kida, MD;  Location: [facility];   Service: Cardiovascular;  Laterality: N/A;  . LEFT HEART CATH AND CORONARY ANGIOGRAPHY N/A 11/09/2017   Procedure: LEFT HEART CATH AND CORONARY ANGIOGRAPHY;  Surgeon: Yolonda Kida, MD;  Location: [facility];  Service: Cardiovascular;  Laterality: N/A;  . [place]   right    Prior to Admission medications   Medication Sig Start Date End Date Taking? Authorizing Provider  ACCU-CHEK SOFTCLIX LANCETS lancets Use as instructed to check blood sugars twice daily. 07/16/17  Yes Philemon Kingdom, MD  allopurinol (ZYLOPRIM) 300 MG tablet TK 1 T PO QD 12/01/17  Yes [provider]  aspirin EC 325 MG EC tablet Take 1 tablet (325 mg total) by mouth daily. 11/15/17  Yes Vaughan Basta, MD  atorvastatin (LIPITOR) 80 MG tablet TAKE 1 TABLET BY MOUTH EVERY DAY AT 6 PM 12/27/17  Yes Venia Carbon, MD  Blood Glucose Monitoring Suppl (ACCU-CHEK GUIDE) w/Device KIT 1 each by Does not apply route 2 (two) times daily. 07/16/17  Yes Philemon Kingdom, MD  brimonidine (ALPHAGAN P) 0.1 % SOLN Place 1 drop into both eyes 2 (two) times daily.    Yes [provider]  carvedilol (COREG) 3.125 MG tablet TAKE 1 TABLET BY MOUTH TWICE DAILY WITH A MEAL 12/27/17  Yes Venia Carbon, MD  clopidogrel (PLAVIX) 75 MG tablet Take  by mouth. 12/20/17  Yes [provider]  dorzolamide-timolol (COSOPT) 22.3-6.8 MG/ML ophthalmic solution Place 1 drop into both eyes 2 (two) times daily.  09/22/16  Yes [provider]  empagliflozin (JARDIANCE) 10 MG TABS tablet Take 10 mg by mouth daily. 03/04/18  Yes Philemon Kingdom, MD  furosemide (LASIX) 40 MG tablet Take 1 tablet (40 mg total) by mouth daily. Patient taking differently: Take by mouth daily.  11/15/17  Yes Vaughan Basta, MD  glucose blood (ACCU-CHEK GUIDE) test strip Use as instructed to check blood sugars twice daily. 07/16/17  Yes Philemon Kingdom, MD  hydrALAZINE (APRESOLINE) 25 MG tablet Take 25  mg by mouth 3 (three) times daily.   Yes [provider]  insulin NPH Human (HUMULIN N) 100 UNIT/ML injection Inject up to 40 units a day, as prescribed 11/26/17  Yes Philemon Kingdom, MD  insulin regular (HUMULIN R) 100 units/mL injection Inject up to 50 units a day as prescribed 11/26/17  Yes Philemon Kingdom, MD  INSULIN SYRINGE .5CC/29G 29G X 1/2" 0.5 ML MISC USE TWICE DAILY 01/17/17  Yes Philemon Kingdom, MD  isosorbide mononitrate (IMDUR) 30 MG 24 hr tablet Take 30 mg by mouth daily.   Yes [provider]  urea (CARMOL) 10 % cream APPLY TOPICALLY DAILY AS NEEDED FOR FEET 06/08/14  Yes Venia Carbon, MD    Family History  Problem Relation Age of Onset  . Arthritis Mother   . Arthritis Father      Social History   Tobacco Use  . Smoking status: Never Smoker  . Smokeless tobacco: Never Used  Substance Use Topics  . Alcohol use: No  . Drug use: No    Allergies as of 04/25/2018  . (No Known Allergies)    Review of Systems:    All systems reviewed and negative except where noted in HPI.   Physical Exam:  BP (!) 160/97   Pulse 84   Ht 5' 9"  (1.753 m)   Wt 248 lb 6.4 oz (112.7 kg)   BMI 36.68 kg/m  No LMP for male patient. Psych:  Alert and cooperative. Normal mood and affect. General:   Alert,  Well-developed, well-nourished, pleasant and cooperative in NAD Head:  Normocephalic and atraumatic. Eyes:  Sclera clear, no icterus.   Conjunctiva pink. Ears:  Normal auditory acuity. Nose:  No deformity, discharge, or lesions. Mouth:  No deformity or lesions,oropharynx pink & moist. Neck:  Supple; no masses or thyromegaly. Abdomen:  Normal bowel sounds.  No bruits.  Soft, non-tender and non-distended without masses, hepatosplenomegaly or hernias noted.  No guarding or rebound tenderness.    Msk:  Symmetrical without gross deformities. Good, equal movement & strength bilaterally. Pulses:  Normal pulses noted. Extremities:  No clubbing or edema.  No  cyanosis. Neurologic:  Alert and oriented x3;  grossly normal neurologically. Skin:  Intact without significant lesions or rashes. No jaundice. Lymph Nodes:  No significant cervical adenopathy. Psych:  Alert and cooperative. Normal mood and affect.   Labs: CBC    Component Value Date/Time   WBC 5.8 11/13/2017 0333   RBC 3.62 (L) 11/13/2017 0333   HGB 10.6 (L) 11/13/2017 0333   HCT 30.7 (L) 11/13/2017 0333   PLT 212 11/13/2017 0333   MCV 84.7 11/13/2017 0333   MCH 29.2 11/13/2017 0333   MCHC 34.5 11/13/2017 0333   RDW 14.5 11/13/2017 0333   LYMPHSABS 1.1 02/15/2016 1208   MONOABS 0.5 02/15/2016 1208   EOSABS 0.2 02/15/2016 1208  BASOSABS 0.0 02/15/2016 1208   CMP     Component Value Date/Time   NA 136 11/14/2017 0311   NA 137 11/14/2017 0311   K 4.5 11/14/2017 0311   K 4.6 11/14/2017 0311   CL 105 11/14/2017 0311   CL 106 11/14/2017 0311   CO2 22 11/14/2017 0311   CO2 22 11/14/2017 0311   GLUCOSE 143 (H) 11/14/2017 0311   GLUCOSE 147 (H) 11/14/2017 0311   BUN 53 (H) 11/14/2017 0311   BUN 59 (H) 11/14/2017 0311   CREATININE 3.07 (H) 11/14/2017 0311   CREATININE 3.08 (H) 11/14/2017 0311   CALCIUM 8.8 (L) 11/14/2017 0311   CALCIUM 8.9 11/14/2017 0311   PROT 6.5 03/05/2017 1239   ALBUMIN 3.2 (L) 11/14/2017 0311   AST 15 03/05/2017 1239   ALT 12 03/05/2017 1239   ALKPHOS 98 03/05/2017 1239   BILITOT 0.5 03/05/2017 1239   GFRNONAA 21 (L) 11/14/2017 0311   GFRNONAA 21 (L) 11/14/2017 0311   GFRAA 24 (L) 11/14/2017 0311   GFRAA 24 (L) 11/14/2017 0311    Imaging Studies: No results found.  Assessment and Plan:   Aaron Mckenzie is a 58 y.o. y/o male has been referred for positive FOBT  I have discussed with the patient that the positive FOBT test indicates colonoscopy to rule out colonic malignancy  Patient asked if there is another test that can be done instead of a colonoscopy.  I discussed that a CT colonoscopy can be done, but it would not allow for  biopsies or removal of any polyps.  In addition, it can miss small polyps.   I extensively discussed that a colonoscopy would be the next best step and is highly recommended in this setting, especially since he has never had a colonoscopy in the past, and the importance of ruling out colonic malignancy or any large lesions in his colon.  I also discussed with him that since he has been having intermittent dysphagia, that an EGD would also be recommended to rule out any upper GI lesion such as strictures or narrowing that can be dilated with an EGD as well.  After discussing the above with him extensively, he would not like to schedule the procedures at this time.  He states he will discuss this with his wife and will call us back if he would like to schedule.  The importance of close follow-up and scheduling the procedures as soon as possible were discussed in detail.  The risk of underlying malignancy were discussed and he verbalized understanding.  Patient had an end STEMI in September 2019 and he is on Plavix.  He will need cardiac clearance prior to his procedures as well.  And this was discussed with him as well.  Once he calls [place] to schedule the procedures, cardiac clearance request can be sent to his cardiologist.  Dr Vonda Antigua  Speech recognition software was used to dictate the above note.

## 2018-05-01 ENCOUNTER — Other Ambulatory Visit: Payer: Self-pay

## 2018-05-01 ENCOUNTER — Telehealth: Payer: Self-pay

## 2018-05-01 DIAGNOSIS — R1319 Other dysphagia: Secondary | ICD-10-CM

## 2018-05-01 DIAGNOSIS — Z1211 Encounter for screening for malignant neoplasm of colon: Secondary | ICD-10-CM

## 2018-05-01 NOTE — Telephone Encounter (Signed)
Pt and wife stop by office to schedule EGD/Colonoscopy. Procedures scheduled for 06/05/2018 at [facility]. Prep instructions given and went over with pt. Pt aware that I will send Dr. Clayborn Bigness blood thinner request, clearance for these procedures. I will contact pt and let him know as to when to stop and resume these medications. Pt had heart attack in September 2019.

## 2018-05-03 ENCOUNTER — Telehealth: Payer: Self-pay

## 2018-05-03 NOTE — Telephone Encounter (Signed)
Pt notified that Dr. Clayborn Bigness has given him cardiac clearance for procedures and pt is to with hold Plavix and ASA, five days prior to procedure and restart 2 days after the procedure.

## 2018-05-09 DIAGNOSIS — I129 Hypertensive chronic kidney disease with stage 1 through stage 4 chronic kidney disease, or unspecified chronic kidney disease: Secondary | ICD-10-CM | POA: Diagnosis not present

## 2018-05-09 DIAGNOSIS — R809 Proteinuria, unspecified: Secondary | ICD-10-CM | POA: Diagnosis not present

## 2018-05-09 DIAGNOSIS — N183 Chronic kidney disease, stage 3 (moderate): Secondary | ICD-10-CM | POA: Diagnosis not present

## 2018-05-09 DIAGNOSIS — E1122 Type 2 diabetes mellitus with diabetic chronic kidney disease: Secondary | ICD-10-CM | POA: Diagnosis not present

## 2018-05-09 DIAGNOSIS — E559 Vitamin D deficiency, unspecified: Secondary | ICD-10-CM | POA: Diagnosis not present

## 2018-05-14 ENCOUNTER — Telehealth: Payer: Self-pay

## 2018-05-14 NOTE — Telephone Encounter (Signed)
Patient contacted office to reschedule colonoscopy due to COVID 19.  He said he will call back after the virus.  Thanks [employer]

## 2018-05-15 ENCOUNTER — Other Ambulatory Visit: Payer: Self-pay

## 2018-05-15 ENCOUNTER — Encounter (INDEPENDENT_AMBULATORY_CARE_PROVIDER_SITE_OTHER): Payer: Medicare Other | Admitting: Ophthalmology

## 2018-05-15 DIAGNOSIS — E113313 Type 2 diabetes mellitus with moderate nonproliferative diabetic retinopathy with macular edema, bilateral: Secondary | ICD-10-CM

## 2018-05-15 DIAGNOSIS — H35033 Hypertensive retinopathy, bilateral: Secondary | ICD-10-CM

## 2018-05-15 DIAGNOSIS — E11311 Type 2 diabetes mellitus with unspecified diabetic retinopathy with macular edema: Secondary | ICD-10-CM

## 2018-05-15 DIAGNOSIS — I10 Essential (primary) hypertension: Secondary | ICD-10-CM | POA: Diagnosis not present

## 2018-05-15 DIAGNOSIS — H43813 Vitreous degeneration, bilateral: Secondary | ICD-10-CM

## 2018-05-17 DIAGNOSIS — I129 Hypertensive chronic kidney disease with stage 1 through stage 4 chronic kidney disease, or unspecified chronic kidney disease: Secondary | ICD-10-CM | POA: Diagnosis not present

## 2018-05-17 DIAGNOSIS — N183 Chronic kidney disease, stage 3 (moderate): Secondary | ICD-10-CM | POA: Diagnosis not present

## 2018-05-17 DIAGNOSIS — R809 Proteinuria, unspecified: Secondary | ICD-10-CM | POA: Diagnosis not present

## 2018-05-17 DIAGNOSIS — E1122 Type 2 diabetes mellitus with diabetic chronic kidney disease: Secondary | ICD-10-CM | POA: Diagnosis not present

## 2018-05-30 ENCOUNTER — Other Ambulatory Visit: Payer: Self-pay

## 2018-05-30 ENCOUNTER — Telehealth: Payer: Self-pay

## 2018-05-30 DIAGNOSIS — R1319 Other dysphagia: Secondary | ICD-10-CM

## 2018-05-30 DIAGNOSIS — Z1211 Encounter for screening for malignant neoplasm of colon: Secondary | ICD-10-CM

## 2018-05-30 NOTE — Telephone Encounter (Signed)
Patient has been contacted to reschedule his colon/egd to June/July.  His new date is 09/04/18.  Thanks [employer]

## 2018-06-04 DIAGNOSIS — L851 Acquired keratosis [keratoderma] palmaris et plantaris: Secondary | ICD-10-CM | POA: Diagnosis not present

## 2018-06-04 DIAGNOSIS — E1140 Type 2 diabetes mellitus with diabetic neuropathy, unspecified: Secondary | ICD-10-CM | POA: Diagnosis not present

## 2018-06-04 DIAGNOSIS — Z794 Long term (current) use of insulin: Secondary | ICD-10-CM | POA: Diagnosis not present

## 2018-06-04 DIAGNOSIS — B351 Tinea unguium: Secondary | ICD-10-CM | POA: Diagnosis not present

## 2018-06-05 ENCOUNTER — Ambulatory Visit: Admit: 2018-06-05 | Payer: Medicare Other | Admitting: Gastroenterology

## 2018-06-05 SURGERY — COLONOSCOPY WITH PROPOFOL
Anesthesia: General

## 2018-06-06 ENCOUNTER — Other Ambulatory Visit: Payer: Self-pay

## 2018-06-06 ENCOUNTER — Encounter (INDEPENDENT_AMBULATORY_CARE_PROVIDER_SITE_OTHER): Payer: Self-pay | Admitting: Vascular Surgery

## 2018-06-06 ENCOUNTER — Ambulatory Visit (INDEPENDENT_AMBULATORY_CARE_PROVIDER_SITE_OTHER): Payer: Medicare Other | Admitting: Vascular Surgery

## 2018-06-06 VITALS — BP 162/101 | HR 79 | Resp 16 | Ht 69.0 in | Wt 247.0 lb

## 2018-06-06 DIAGNOSIS — I10 Essential (primary) hypertension: Secondary | ICD-10-CM

## 2018-06-06 DIAGNOSIS — I25119 Atherosclerotic heart disease of native coronary artery with unspecified angina pectoris: Secondary | ICD-10-CM | POA: Diagnosis not present

## 2018-06-06 DIAGNOSIS — Z7902 Long term (current) use of antithrombotics/antiplatelets: Secondary | ICD-10-CM

## 2018-06-06 DIAGNOSIS — Z79899 Other long term (current) drug therapy: Secondary | ICD-10-CM

## 2018-06-06 DIAGNOSIS — I872 Venous insufficiency (chronic) (peripheral): Secondary | ICD-10-CM | POA: Diagnosis not present

## 2018-06-06 DIAGNOSIS — I890 Lymphedema, not elsewhere classified: Secondary | ICD-10-CM

## 2018-06-06 DIAGNOSIS — E785 Hyperlipidemia, unspecified: Secondary | ICD-10-CM

## 2018-06-06 DIAGNOSIS — I739 Peripheral vascular disease, unspecified: Secondary | ICD-10-CM | POA: Insufficient documentation

## 2018-06-06 NOTE — Progress Notes (Signed)
MRN : [id number]  Aaron Mckenzie is a 58 y.o. (08/07/1960) male who presents with chief complaint of leg swelling.  History of Present Illness:   Patient is seen for evaluation of leg pain and leg swelling. The patient first noticed the swelling remotely. The swelling is associated with pain and discoloration. The pain and swelling worsens with prolonged dependency and improves with elevation. The pain is unrelated to activity.  The patient notes that in the morning the legs are significantly improved but they steadily worsened throughout the course of the day. The patient also notes a steady worsening of the discoloration in the ankle and shin area.   The patient denies claudication symptoms.  The patient denies symptoms consistent with rest pain.  The patient denies and extensive history of DJD and LS spine disease.  The patient has no had any past angiography, interventions or vascular surgery.  Elevation makes the leg symptoms better, dependency makes them much worse. There is no history of ulcerations. The patient denies any recent changes in medications.  The patient has not been wearing graduated compression.  The patient denies a history of DVT or PE. There is no prior history of phlebitis. There is no history of primary lymphedema.  No history of malignancies. No history of trauma or groin or pelvic surgery. There is no history of radiation treatment to the groin or pelvis  The patient denies amaurosis fugax or recent TIA symptoms. There are no recent neurological changes noted. The patient denies recent episodes of angina or shortness of breath  No outpatient medications have been marked as taking for the 06/06/18 encounter (Appointment) with Delana Meyer, Dolores Lory, MD.    Past Medical History:  Diagnosis Date  . Atherosclerotic heart disease of native coronary artery with angina pectoris ([place])   . Diabetes mellitus   . Ectodermal dysplasia   . Hyperlipidemia   .  Hypertension   . Pseudogout   . Type 2 diabetes mellitus with neurological manifestations, uncontrolled ([place]) 04/23/2008   Qualifier: Diagnosis of  By: Silvio Pate MD, [facility]     Past Surgical History:  Procedure Laterality Date  . APPENDECTOMY    . CATARACT EXTRACTION Left 07/2016  . CORONARY BALLOON ANGIOPLASTY N/A 11/09/2017   Procedure: CORONARY BALLOON ANGIOPLASTY;  Surgeon: Yolonda Kida, MD;  Location: [facility];  Service: Cardiovascular;  Laterality: N/A;  . LEFT HEART CATH AND CORONARY ANGIOGRAPHY N/A 11/09/2017   Procedure: LEFT HEART CATH AND CORONARY ANGIOGRAPHY;  Surgeon: Yolonda Kida, MD;  Location: [facility];  Service: Cardiovascular;  Laterality: N/A;  . RIB FRACTURE SURGERY  1988   right    Social History Social History   Tobacco Use  . Smoking status: Never Smoker  . Smokeless tobacco: Never Used  Substance Use Topics  . Alcohol use: No  . Drug use: No    Family History Family History  Problem Relation Age of Onset  . Arthritis Mother   . Arthritis Father   No family history of bleeding/clotting disorders, porphyria or autoimmune disease   No Known Allergies   REVIEW OF SYSTEMS (Negative unless checked)  Constitutional: [] Weight loss  [] Fever  [] Chills Cardiac: [x] Chest pain   [] Chest pressure   [] Palpitations   [] Shortness of breath when laying flat   [x] Shortness of breath with exertion. Vascular:  [] Pain in legs with walking   [x] Pain in legs at rest  [] History of DVT   [] Phlebitis   [x] Swelling in legs   []   Varicose veins   [] Non-healing ulcers Pulmonary:   [] Uses home oxygen   [] Productive cough   [] Hemoptysis   [] Wheeze  [] COPD   [] Asthma Neurologic:  [] Dizziness   [] Seizures   [] History of stroke   [] History of TIA  [] Aphasia   [] Vissual changes   [] Weakness or numbness in arm   [x] Weakness or numbness in leg Musculoskeletal:   [] Joint swelling   [x] Joint pain   [] Low back pain Hematologic:  [] Easy bruising  [] Easy  bleeding   [] Hypercoagulable state   [] Anemic Gastrointestinal:  [] Diarrhea   [] Vomiting  [] Gastroesophageal reflux/heartburn   [] Difficulty swallowing. Genitourinary:  [] Chronic kidney disease   [] Difficult urination  [] Frequent urination   [] Blood in urine Skin:  [x] Rashes   [] Ulcers  Psychological:  [] History of anxiety   []  History of major depression.  Physical Examination  There were no vitals filed for this visit. There is no height or weight on file to calculate BMI. Gen: WD/WN, NAD Head: [place]/AT, No temporalis wasting.  Ear/Nose/Throat: Hearing grossly intact, nares w/o erythema or drainage, poor dentition Eyes: PER, EOMI, sclera nonicteric.  Neck: Supple, no masses.  No bruit or JVD.  Pulmonary:  Good air movement, clear to auscultation bilaterally, no use of accessory muscles.  Cardiac: RRR, normal S1, S2, no Murmurs. Vascular: scattered varicosities present bilaterally.  Severe venous stasis changes to the legs bilaterally.  4+ severe hard nonpitting   Vessel Right Left  Radial Palpable Palpable  PT Palpable Palpable  DP Palpable Palpable   Gastrointestinal: soft, non-distended. No guarding/no peritoneal signs.  Musculoskeletal: M/S 5/5 throughout.  No deformity or atrophy.  Neurologic: CN 2-12 intact. Pain and light touch intact in extremities.  Symmetrical.  Speech is fluent. Motor exam as listed above. Psychiatric: Judgment intact, Mood & affect appropriate for pt's clinical situation. Dermatologic: Severe rashes bilaterally but no ulcers noted.  No changes consistent with cellulitis. Lymph : No Cervical lymphadenopathy, no lichenification or skin changes of chronic lymphedema.  CBC Lab Results  Component Value Date   WBC 5.8 11/13/2017   HGB 10.6 (L) 11/13/2017   HCT 30.7 (L) 11/13/2017   MCV 84.7 11/13/2017   PLT 212 11/13/2017    BMET    Component Value Date/Time   NA 136 11/14/2017 0311   NA 137 11/14/2017 0311   K 4.5 11/14/2017 0311   K 4.6 11/14/2017  0311   CL 105 11/14/2017 0311   CL 106 11/14/2017 0311   CO2 22 11/14/2017 0311   CO2 22 11/14/2017 0311   GLUCOSE 143 (H) 11/14/2017 0311   GLUCOSE 147 (H) 11/14/2017 0311   BUN 53 (H) 11/14/2017 0311   BUN 59 (H) 11/14/2017 0311   CREATININE 3.07 (H) 11/14/2017 0311   CREATININE 3.08 (H) 11/14/2017 0311   CALCIUM 8.8 (L) 11/14/2017 0311   CALCIUM 8.9 11/14/2017 0311   GFRNONAA 21 (L) 11/14/2017 0311   GFRNONAA 21 (L) 11/14/2017 0311   GFRAA 24 (L) 11/14/2017 0311   GFRAA 24 (L) 11/14/2017 0311   CrCl cannot be calculated (Patient's most recent lab result is older than the maximum 21 days allowed.).  COAG Lab Results  Component Value Date   INR 1.05 11/09/2017    Radiology No results found.   Assessment/Plan 1. Lymphedema I have had a long discussion with the patient regarding swelling and why it  causes symptoms.  Patient will begin wearing graduated compression stockings class 1 (20-30 mmHg) on a daily basis a prescription was given. The patient will  beginning wearing the stockings first thing in the morning and removing them in the evening. The patient is instructed specifically not to sleep in the stockings.   In addition, behavioral modification will be initiated.  This will include frequent elevation, use of over the counter pain medications and exercise such as walking.  I have reviewed systemic causes for chronic edema such as liver, kidney and cardiac etiologies.  The patient denies problems with these organ systems.    Consideration for a lymph pump will also be made based upon the effectiveness of conservative therapy.  This would help to improve the edema control and prevent sequela such as ulcers and infections   Patient should undergo duplex ultrasound of the venous system to ensure that DVT or reflux is not present.  The patient will follow-up with me after the ultrasound.   - VAS [place] LOWER EXTREMITY VENOUS REFLUX; Future  2. Chronic venous insufficiency  I have had a long discussion with the patient regarding swelling and why it  causes symptoms.  Patient will begin wearing graduated compression stockings class 1 (20-30 mmHg) on a daily basis a prescription was given. The patient will  beginning wearing the stockings first thing in the morning and removing them in the evening. The patient is instructed specifically not to sleep in the stockings.   In addition, behavioral modification will be initiated.  This will include frequent elevation, use of over the counter pain medications and exercise such as walking.  I have reviewed systemic causes for chronic edema such as liver, kidney and cardiac etiologies.  The patient denies problems with these organ systems.    Consideration for a lymph pump will also be made based upon the effectiveness of conservative therapy.  This would help to improve the edema control and prevent sequela such as ulcers and infections   Patient should undergo duplex ultrasound of the venous system to ensure that DVT or reflux is not present.  The patient will follow-up with me after the ultrasound.   - VAS [place] LOWER EXTREMITY VENOUS REFLUX; Future  3. PAD (peripheral artery disease) (HCC)  Recommend:  The patient has atypical pain symptoms for pure atherosclerotic disease. However, on physical exam there is evidence of mixed venous and arterial disease, given the diminished pulses and the edema associated with venous changes of the legs.  Noninvasive studies including ABI's and venous ultrasound of the legs will be obtained and the patient will follow up with me to review these studies.  The patient should continue walking and begin a more formal exercise program. The patient should continue his antiplatelet therapy and aggressive treatment of the lipid abnormalities.  The patient should begin wearing graduated compression socks 15-20 mmHg strength to control edema.   - VAS [place] ABI WITH/WO TBI; Future  4. Essential  hypertension, benign Continue antihypertensive medications as already ordered, these medications have been reviewed and there are no changes at this time.   5. Atherosclerosis of native coronary artery of native heart with angina pectoris ([place]) Continue cardiac and antihypertensive medications as already ordered and reviewed, no changes at this time.  Continue statin as ordered and reviewed, no changes at this time  Nitrates PRN for chest pain   6. Hyperlipidemia, unspecified hyperlipidemia type Continue statin as ordered and reviewed, no changes at this time    Hortencia Pilar, MD  06/06/2018 8:58 AM

## 2018-06-12 ENCOUNTER — Other Ambulatory Visit: Payer: Self-pay

## 2018-06-12 ENCOUNTER — Encounter (INDEPENDENT_AMBULATORY_CARE_PROVIDER_SITE_OTHER): Payer: Medicare Other | Admitting: Ophthalmology

## 2018-06-12 DIAGNOSIS — E113213 Type 2 diabetes mellitus with mild nonproliferative diabetic retinopathy with macular edema, bilateral: Secondary | ICD-10-CM | POA: Diagnosis not present

## 2018-06-12 DIAGNOSIS — I10 Essential (primary) hypertension: Secondary | ICD-10-CM | POA: Diagnosis not present

## 2018-06-12 DIAGNOSIS — E10311 Type 1 diabetes mellitus with unspecified diabetic retinopathy with macular edema: Secondary | ICD-10-CM

## 2018-06-12 DIAGNOSIS — H43813 Vitreous degeneration, bilateral: Secondary | ICD-10-CM

## 2018-06-12 DIAGNOSIS — H35033 Hypertensive retinopathy, bilateral: Secondary | ICD-10-CM

## 2018-06-17 ENCOUNTER — Other Ambulatory Visit: Payer: Self-pay | Admitting: Internal Medicine

## 2018-06-18 ENCOUNTER — Other Ambulatory Visit: Payer: Self-pay | Admitting: Internal Medicine

## 2018-06-19 NOTE — Telephone Encounter (Signed)
Patient is not taking this medication.  Lisinopril was discontinued by hospital at discharge on 11/14/17.

## 2018-06-20 ENCOUNTER — Encounter (INDEPENDENT_AMBULATORY_CARE_PROVIDER_SITE_OTHER): Payer: Self-pay

## 2018-07-03 ENCOUNTER — Encounter: Payer: Self-pay | Admitting: Internal Medicine

## 2018-07-03 ENCOUNTER — Other Ambulatory Visit: Payer: Self-pay

## 2018-07-03 ENCOUNTER — Ambulatory Visit (INDEPENDENT_AMBULATORY_CARE_PROVIDER_SITE_OTHER): Payer: Medicare Other | Admitting: Internal Medicine

## 2018-07-03 DIAGNOSIS — E1165 Type 2 diabetes mellitus with hyperglycemia: Secondary | ICD-10-CM

## 2018-07-03 DIAGNOSIS — E785 Hyperlipidemia, unspecified: Secondary | ICD-10-CM | POA: Diagnosis not present

## 2018-07-03 DIAGNOSIS — E1142 Type 2 diabetes mellitus with diabetic polyneuropathy: Secondary | ICD-10-CM

## 2018-07-03 DIAGNOSIS — Z794 Long term (current) use of insulin: Secondary | ICD-10-CM | POA: Diagnosis not present

## 2018-07-03 DIAGNOSIS — IMO0002 Reserved for concepts with insufficient information to code with codable children: Secondary | ICD-10-CM

## 2018-07-03 NOTE — Progress Notes (Signed)
Patient ID: Aaron Mckenzie, male   DOB: 1960/06/23, 58 y.o.   MRN: [id number]  Patient location: Home My location: Office  Referring Provider: Venia Carbon, MD  I connected with the patient on 07/03/18 at 11:07 AM EDT by a video enabled telemedicine application and verified that I am speaking with the correct person.   I discussed the limitations of evaluation and management by telemedicine and the availability of in person appointments. The patient expressed understanding and agreed to proceed.   Details of the encounter are shown below.  HPI: Aaron Mckenzie is a 59 y.o.-year-old male, presenting for f/u for DM2, dx 2004, insulin-dependent since 12/2012, uncontrolled, with complications (CAD - s/p STEMI 10/2017, CKD, neuropathy, DR, PAD - s/p foot ulcer). Last visit 3 months ago.  He had a foot ulcer (now healed). and sees Dr. Thresa Ross at [facility].  He started compression hoses >> they help significantly.  Last hemoglobin A1c was:  Lab Results  Component Value Date   HGBA1C 8.5 (A) 03/04/2018   HGBA1C 9.0 (H) 11/10/2017   HGBA1C 9.3 (A) 07/16/2017  04/19/2017: The HbA1c calculated from fructosamine is much better: 7.8%! 01/16/2017: 10.2%  Pt was on: U500 insulin: - 20 units before a smaller meal - 22 units before a larger meal. Sliding scale of insulin: - 150-175: + 1 unit  - 176-200: + 2 units  - 201-225: + 3 units  - > 226: + 4 units  We stopped Glipizide 5 mg bid when we started mealtime insulin We had to stop Metformin 2/2 decreased kidney fxn.  He was then on: N and R insulin (ReliOn): Insulin Before brunch Before dinner  Regular 50 40  NPH 60  50    He was on: - 70/30 insulin - but actually using only R insulin!!!! - 50 units before b'fast - 70 units before dinner - Jardiance 10 mg before b'fast - started 03/2017  He was then on: N and R insulin (ReliOn): Insulin Before brunch Before dinner  Regular (clear) 30 >> 15 40 >> 25  NPH (cloudy) 30 >>  15 40 >> 25   -  >> stopped b/c AKI  Since hospitalization: (!!) - Levemir 15 units in am and 20 units at bedtime  At last visit, I again suggested to start:  N and R insulin (ReliOn): Insulin Before brunch Before dinner  Regular (clear)  15  25  NPH (cloudy)  15  25   At last visits, I repeatedly  rec'd Jardiance 10 mg before b'fast - did not start for fear of SEs.  Pt checks sugars twice a day: - am:  123, 166-214, 243, 251 >> 207-240 - before lunch: 131, 170 >> n/c >> 223 >> n/c - 2h after lunch: 400, 536 >> n/c  >> 197 >> n/c - before dinner: 107-122, 138 >> 100-120 >> n/c >> 250-260 - 2h after dinner: 300s >> n/c  - bedtime:  201-324 >> 131, 151-188, 219 >> n/c Lowest: 197 >> 127 >> 207. Highest: 324 >> 367 >> 329 at Dr. Assunta Gambles office.  Pt's meals are: - Breakfast: eggs, meat, grits, wheat toast - Lunch: may skip or eat late b'fast  - Dinner: chicken/fish/pork chops, 2 sides, salad, baked potatoes - Snacks: 2x a day - apple slices, popcorn  -+ CKD, last BUN/creatinine:  05/09/2018: 28/1.62, GFR 45 - at the appt with nephrology - Dr. Juleen China 11/21/2017: GFR 46 - at the appt with nephrology - Dr. Juleen China He has a  history of AKI due to contrast Lab Results  Component Value Date   BUN 53 (H) 11/14/2017   BUN 59 (H) 11/14/2017   CREATININE 3.07 (H) 11/14/2017   CREATININE 3.08 (H) 11/14/2017  11/29/2016: BUN/Cr 17/1.31, GFR 60, Glu 484  + Proteinuria:  05/09/2018: 1937 mg/g 11/29/2016: 12,806 mg/g  On lisinopril.  -+ HL; repeat lipids: Lab Results  Component Value Date   CHOL 155 11/10/2017   CHOL 175 03/05/2017   CHOL 121 02/15/2016   Lab Results  Component Value Date   HDL 19 (L) 11/10/2017   HDL 27.50 (L) 03/05/2017   HDL 22.20 (L) 02/15/2016   Lab Results  Component Value Date   LDLCALC 91 11/10/2017   Lab Results  Component Value Date   TRIG 223 (H) 11/10/2017   TRIG (H) 03/05/2017    577.0 Triglyceride is over 400; calculations on Lipids  are invalid.   TRIG 301.0 (H) 02/15/2016   Lab Results  Component Value Date   CHOLHDL 8.2 11/10/2017   CHOLHDL 6 03/05/2017   CHOLHDL 5 02/15/2016   Lab Results  Component Value Date   LDLDIRECT 76.0 03/05/2017   LDLDIRECT 55.0 02/15/2016   LDLDIRECT 122.0 01/29/2015  On  pravastatin.  On ASA 325, also on Plavix.   - last eye exam was in 05/2018: + DR; Dr. Katy Fitch >> had cataract sx 08/2016.   He continues intraocular injections + drops for glaucoma.  - + numbness and tingling in his feet and hands.  He is off Neurontin.  He also has a history of HTN, pseudogout, bilateral carpal tunnel s/p sx in each hand: 1985 and 2000.  ROS: Constitutional: no weight gain/+ weight loss, no fatigue, no subjective hyperthermia, no subjective hypothermia Eyes: no blurry vision, no xerophthalmia ENT: no sore throat, no nodules palpated in neck, no dysphagia, no odynophagia, no hoarseness Cardiovascular: no CP/no SOB/no palpitations/+ leg swelling Respiratory: no cough/no SOB/no wheezing Gastrointestinal: no N/no V/no D/no C/no acid reflux Musculoskeletal: no muscle aches/no joint aches Skin: no rashes, no hair loss Neurological: no tremors/+ numbness/+ tingling/no dizziness  I reviewed pt's medications, allergies, PMH, social hx, family hx, and changes were documented in the history of present illness. Otherwise, unchanged from my initial visit note.  Past Medical History:  Diagnosis Date  . Atherosclerotic heart disease of native coronary artery with angina pectoris ([place])   . Diabetes mellitus   . Ectodermal dysplasia   . Hyperlipidemia   . Hypertension   . Pseudogout   . Type 2 diabetes mellitus with neurological manifestations, uncontrolled ([place]) 04/23/2008   Qualifier: Diagnosis of  By: Silvio Pate MD, [facility]    Past Surgical History:  Procedure Laterality Date  . APPENDECTOMY    . CATARACT EXTRACTION Left 07/2016  . CORONARY BALLOON ANGIOPLASTY N/A 11/09/2017   Procedure:  CORONARY BALLOON ANGIOPLASTY;  Surgeon: Yolonda Kida, MD;  Location: [place] [facility];  Service: Cardiovascular;  Laterality: N/A;  . LEFT HEART CATH AND CORONARY ANGIOGRAPHY N/A 11/09/2017   Procedure: LEFT HEART CATH AND CORONARY ANGIOGRAPHY;  Surgeon: Yolonda Kida, MD;  Location: [place] [facility];  Service: Cardiovascular;  Laterality: N/A;  . RIB FRACTURE SURGERY  1988   right   Social History   Socioeconomic History  . Marital status: Married    Spouse name: Not on file  . Number of children: Not on file  . Years of education: Not on file  . Highest education level: Not on file  Occupational History  .  Occupation: Retired after disability    Comment: [employer] at [employer]  . Financial resource strain: Not on file  . Food insecurity:    Worry: Not on file    Inability: Not on file  . Transportation needs:    Medical: Not on file    Non-medical: Not on file  Tobacco Use  . Smoking status: Never Smoker  . Smokeless tobacco: Never Used  Substance and Sexual Activity  . Alcohol use: No  . Drug use: No  . Sexual activity: Yes  Lifestyle  . Physical activity:    Days per week: Not on file    Minutes per session: Not on file  . Stress: Not on file  Relationships  . Social connections:    Talks on phone: Not on file    Gets together: Not on file    Attends religious service: Not on file    Active member of club or organization: Not on file    Attends meetings of clubs or organizations: Not on file    Relationship status: Not on file  . Intimate partner violence:    Fear of current or ex partner: Not on file    Emotionally abused: Not on file    Physically abused: Not on file    Forced sexual activity: Not on file  Other Topics Concern  . Not on file  Social History Narrative   No living will or health care POA   Would want wife to make medical decisions for him--alternate is brother   Would accept CPR but no prolonged  ventilation   Not sure about tube feeds   Current Outpatient Medications on File Prior to Visit  Medication Sig Dispense Refill  . ACCU-CHEK SOFTCLIX LANCETS lancets Use as instructed to check blood sugars twice daily. 200 each 3  . allopurinol (ZYLOPRIM) 300 MG tablet TK 1 T PO QD  3  . aspirin EC 325 MG EC tablet Take 1 tablet (325 mg total) by mouth daily. 30 tablet 0  . atorvastatin (LIPITOR) 80 MG tablet TAKE 1 TABLET BY MOUTH EVERY DAY AT 6 PM 30 tablet 11  . Blood Glucose Monitoring Suppl (ACCU-CHEK GUIDE) w/Device KIT 1 each by Does not apply route 2 (two) times daily. 1 kit 0  . brimonidine (ALPHAGAN P) 0.1 % SOLN Place 1 drop into both eyes 2 (two) times daily.     . carvedilol (COREG) 3.125 MG tablet TAKE 1 TABLET BY MOUTH TWICE DAILY WITH A MEAL 60 tablet 11  . clopidogrel (PLAVIX) 75 MG tablet Take by mouth.    . dorzolamide-timolol (COSOPT) 22.3-6.8 MG/ML ophthalmic solution Place 1 drop into both eyes 2 (two) times daily.     . empagliflozin (JARDIANCE) 10 MG TABS tablet Take 10 mg by mouth daily. 30 tablet 5  . furosemide (LASIX) 40 MG tablet Take 1 tablet (40 mg total) by mouth daily. (Patient taking differently: Take by mouth daily. ) 30 tablet 0  . gabapentin (NEURONTIN) 300 MG capsule TK 3 CS PO TID    . glucose blood (ACCU-CHEK GUIDE) test strip Use as instructed to check blood sugars twice daily. 200 each 3  . hydrALAZINE (APRESOLINE) 25 MG tablet Take 25 mg by mouth 3 (three) times daily.    . insulin detemir (LEVEMIR) 100 UNIT/ML injection Inject into the skin.    Marland Kitchen insulin NPH Human (HUMULIN N) 100 UNIT/ML injection Inject up to 40 units a day, as prescribed (Patient not taking:  Reported on 06/06/2018) 20 mL 11  . insulin regular (HUMULIN R) 100 units/mL injection Inject up to 50 units a day as prescribed (Patient not taking: Reported on 06/06/2018) 20 mL 11  . INSULIN SYRINGE .5CC/29G 29G X 1/2" 0.5 ML MISC USE TWICE DAILY 100 each 0  . isosorbide mononitrate (IMDUR) 30  MG 24 hr tablet Take 30 mg by mouth daily.    . urea (CARMOL) 10 % cream APPLY TOPICALLY DAILY AS NEEDED FOR FEET 85 g 5   No current facility-administered medications on file prior to visit.    No Known Allergies Family History  Problem Relation Age of Onset  . Arthritis Mother   . Arthritis Father     PE: There were no vitals taken for this visit. There is no height or weight on file to calculate BMI. Wt Readings from Last 3 Encounters:  06/06/18 247 lb (112 kg)  04/25/18 248 lb 6.4 oz (112.7 kg)  03/12/18 244 lb (110.7 kg)   Constitutional:  in NAD  The physical exam was not performed (virtual visit).  ASSESSMENT: 1. DM2, insulin-dependent, uncontrolled, with complications - increased insulin resistance - Cardiovascular disease- s/p STEMI 11/09/2017 - PN  -stable - CKD - PAD - s/p foot ulcer  2. HL  3.  Obesity  PLAN:  1. Patient with longstanding, uncontrolled, type 2 diabetes, with high insulin resistance, previously on concentrated insulin (U500), now with improving blood sugars, on NPH and regular insulin.  At last visit, sugars were still higher than target and we decided to add Jardiance.  I suggested 10 mg daily (lower dose) due to his decreased CKD.  We did not change his regimen at that time but I also gave him an alternative regimen with increased insulin doses if he could not get Jardiance. -At this visit, he tells me that he did not start the Jardiance as he was still afraid of side effects.  I tried multiple times to convince him about the many benefits of Jardiance in the past but he refuses.  Therefore, we will need to continue with NPH and regular insulin only.  Unfortunately, he did not read the instructions to increase the insulin doses if he was not starting Jardiance at last visit, so he continues to stay on the lower doses.  Sugars are higher than before, mostly in the 200s.  We will need to increase both insulins. - I advised him to:  Patient  Instructions   Please increase: N and R insulin (ReliOn): Insulin Before brunch Before dinner  Regular (clear) 20-25 25  NPH (cloudy) 25 30   Please return in 3 months with your sugar log.    - We will check his HbA1c when he returns to the clinic. - continue checking sugars at different times of the day - check 2x a day, rotating checks - advised for yearly eye exams >> he is UTD - Return to clinic in 3 mo with sugar log      2. HL - Reviewed latest lipid panel from 10/2017: LDL lower than 100, but not quite at goal, HDL very low, triglycerides high, but much improved Lab Results  Component Value Date   CHOL 155 11/10/2017   HDL 19 (L) 11/10/2017   LDLCALC 91 11/10/2017   LDLDIRECT 76.0 03/05/2017   TRIG 223 (H) 11/10/2017   CHOLHDL 8.2 11/10/2017  - Continues pravastatin without side effects.  3.  Obesity -lost 3 lbs since our last OV - he feels that this  is related to fluid loss from legs after starting the compression stockings -We try to add Jardiance several times in the past but he was afraid of negative publicity ("flesh eating bacterial infection").  I explained that this is an extremely rare side effect and the benefits of starting the medication are significant.  He agreed to start it at last visit, however, he decided not to do so.  Unfortunately, at this visit, we need to increase his insulin doses, which is conducive to weight gain.  Philemon Kingdom, MD PhD [facility] Endocrinology

## 2018-07-03 NOTE — Patient Instructions (Addendum)
  Please increase: N and R insulin (ReliOn): Insulin Before brunch Before dinner  Regular (clear) 20-25 25  NPH (cloudy) 25 30   Please return in 3 months with your sugar log.

## 2018-07-10 ENCOUNTER — Encounter (INDEPENDENT_AMBULATORY_CARE_PROVIDER_SITE_OTHER): Payer: Medicare Other | Admitting: Ophthalmology

## 2018-07-10 ENCOUNTER — Other Ambulatory Visit: Payer: Self-pay

## 2018-07-10 DIAGNOSIS — H35033 Hypertensive retinopathy, bilateral: Secondary | ICD-10-CM

## 2018-07-10 DIAGNOSIS — I10 Essential (primary) hypertension: Secondary | ICD-10-CM | POA: Diagnosis not present

## 2018-07-10 DIAGNOSIS — H43813 Vitreous degeneration, bilateral: Secondary | ICD-10-CM | POA: Diagnosis not present

## 2018-07-10 DIAGNOSIS — E113313 Type 2 diabetes mellitus with moderate nonproliferative diabetic retinopathy with macular edema, bilateral: Secondary | ICD-10-CM | POA: Diagnosis not present

## 2018-07-10 DIAGNOSIS — E11311 Type 2 diabetes mellitus with unspecified diabetic retinopathy with macular edema: Secondary | ICD-10-CM | POA: Diagnosis not present

## 2018-07-29 DIAGNOSIS — N183 Chronic kidney disease, stage 3 (moderate): Secondary | ICD-10-CM | POA: Diagnosis not present

## 2018-07-29 DIAGNOSIS — E1122 Type 2 diabetes mellitus with diabetic chronic kidney disease: Secondary | ICD-10-CM | POA: Diagnosis not present

## 2018-07-29 DIAGNOSIS — E559 Vitamin D deficiency, unspecified: Secondary | ICD-10-CM | POA: Diagnosis not present

## 2018-07-29 DIAGNOSIS — I129 Hypertensive chronic kidney disease with stage 1 through stage 4 chronic kidney disease, or unspecified chronic kidney disease: Secondary | ICD-10-CM | POA: Diagnosis not present

## 2018-07-29 DIAGNOSIS — R809 Proteinuria, unspecified: Secondary | ICD-10-CM | POA: Diagnosis not present

## 2018-08-01 ENCOUNTER — Other Ambulatory Visit: Payer: Self-pay | Admitting: Internal Medicine

## 2018-08-05 DIAGNOSIS — E1122 Type 2 diabetes mellitus with diabetic chronic kidney disease: Secondary | ICD-10-CM | POA: Diagnosis not present

## 2018-08-05 DIAGNOSIS — I129 Hypertensive chronic kidney disease with stage 1 through stage 4 chronic kidney disease, or unspecified chronic kidney disease: Secondary | ICD-10-CM | POA: Diagnosis not present

## 2018-08-05 DIAGNOSIS — R809 Proteinuria, unspecified: Secondary | ICD-10-CM | POA: Diagnosis not present

## 2018-08-05 DIAGNOSIS — N183 Chronic kidney disease, stage 3 (moderate): Secondary | ICD-10-CM | POA: Diagnosis not present

## 2018-08-07 ENCOUNTER — Other Ambulatory Visit: Payer: Self-pay

## 2018-08-07 ENCOUNTER — Encounter (INDEPENDENT_AMBULATORY_CARE_PROVIDER_SITE_OTHER): Payer: Medicare Other | Admitting: Ophthalmology

## 2018-08-07 DIAGNOSIS — H35033 Hypertensive retinopathy, bilateral: Secondary | ICD-10-CM

## 2018-08-07 DIAGNOSIS — I10 Essential (primary) hypertension: Secondary | ICD-10-CM

## 2018-08-07 DIAGNOSIS — E113313 Type 2 diabetes mellitus with moderate nonproliferative diabetic retinopathy with macular edema, bilateral: Secondary | ICD-10-CM | POA: Diagnosis not present

## 2018-08-07 DIAGNOSIS — H43813 Vitreous degeneration, bilateral: Secondary | ICD-10-CM

## 2018-08-07 DIAGNOSIS — E11311 Type 2 diabetes mellitus with unspecified diabetic retinopathy with macular edema: Secondary | ICD-10-CM | POA: Diagnosis not present

## 2018-08-27 ENCOUNTER — Telehealth: Payer: Self-pay | Admitting: Gastroenterology

## 2018-08-27 NOTE — Telephone Encounter (Signed)
Colonoscopy/EGD rescheduled from 09-04-18 to 10-02-18. Pt aware to get Covid testing on September 27, 2018 at [facility] between 10:30 am and 12:30 pm. Wannetta Sender at [facility] End[facility] notified. Referral updated.

## 2018-08-27 NOTE — Telephone Encounter (Signed)
pATIENT CALLED & WOULD LIKE TO R/S HIS COLONOSCOPY FROM  09-04-18 TO 10-02-18.

## 2018-09-04 ENCOUNTER — Encounter (INDEPENDENT_AMBULATORY_CARE_PROVIDER_SITE_OTHER): Payer: Medicare Other | Admitting: Ophthalmology

## 2018-09-09 ENCOUNTER — Encounter (INDEPENDENT_AMBULATORY_CARE_PROVIDER_SITE_OTHER): Payer: Self-pay | Admitting: Vascular Surgery

## 2018-09-09 ENCOUNTER — Ambulatory Visit (INDEPENDENT_AMBULATORY_CARE_PROVIDER_SITE_OTHER): Payer: Medicare Other

## 2018-09-09 ENCOUNTER — Other Ambulatory Visit: Payer: Self-pay

## 2018-09-09 ENCOUNTER — Ambulatory Visit (INDEPENDENT_AMBULATORY_CARE_PROVIDER_SITE_OTHER): Payer: Medicare Other | Admitting: Vascular Surgery

## 2018-09-09 VITALS — BP 129/75 | HR 85 | Resp 16 | Ht 69.0 in | Wt 250.0 lb

## 2018-09-09 DIAGNOSIS — I10 Essential (primary) hypertension: Secondary | ICD-10-CM

## 2018-09-09 DIAGNOSIS — E785 Hyperlipidemia, unspecified: Secondary | ICD-10-CM | POA: Diagnosis not present

## 2018-09-09 DIAGNOSIS — I890 Lymphedema, not elsewhere classified: Secondary | ICD-10-CM | POA: Diagnosis not present

## 2018-09-09 DIAGNOSIS — I739 Peripheral vascular disease, unspecified: Secondary | ICD-10-CM

## 2018-09-09 DIAGNOSIS — I872 Venous insufficiency (chronic) (peripheral): Secondary | ICD-10-CM

## 2018-09-09 DIAGNOSIS — I25119 Atherosclerotic heart disease of native coronary artery with unspecified angina pectoris: Secondary | ICD-10-CM | POA: Diagnosis not present

## 2018-09-09 DIAGNOSIS — Z79899 Other long term (current) drug therapy: Secondary | ICD-10-CM

## 2018-09-09 NOTE — Progress Notes (Signed)
MRN : [id number]  Aaron Mckenzie is a 57 y.o. (Jun 24, 1960) male who presents with chief complaint of  Chief Complaint  Patient presents with   Follow-up    19monthabi  .  History of Present Illness:   The patient returns to the office for followup evaluation regarding leg swelling.  The swelling has improved quite a bit and the pain associated with swelling has decreased substantially. There have not been any interval development of a ulcerations or wounds.  Since the previous visit the patient has been wearing graduated compression stockings and has noted little significant improvement in the lymphedema. The patient has been using compression routinely morning until night.  The patient also states elevation during the day and exercise is being done too.  ABI Rt=1.54 and Lt=1.44 (with triphasic signal at the ankle) Venous duplex shows some reflux in the bilateral saphenous veins, normal deep system  Current Meds  Medication Sig   ACCU-CHEK GUIDE test strip USE AS DIRECTED TO CHECK BLOOD SUGARS TWICE DAILY   ACCU-CHEK SOFTCLIX LANCETS lancets Use as instructed to check blood sugars twice daily.   allopurinol (ZYLOPRIM) 300 MG tablet TK 1 T PO QD   atorvastatin (LIPITOR) 80 MG tablet TAKE 1 TABLET BY MOUTH EVERY DAY AT 6 PM   Blood Glucose Monitoring Suppl (ACCU-CHEK GUIDE) w/Device KIT 1 each by Does not apply route 2 (two) times daily.   brimonidine (ALPHAGAN P) 0.1 % SOLN Place 1 drop into both eyes 2 (two) times daily.    carvedilol (COREG) 3.125 MG tablet TAKE 1 TABLET BY MOUTH TWICE DAILY WITH A MEAL   clopidogrel (PLAVIX) 75 MG tablet Take by mouth.   dorzolamide-timolol (COSOPT) 22.3-6.8 MG/ML ophthalmic solution Place 1 drop into both eyes 2 (two) times daily.    furosemide (LASIX) 40 MG tablet Take 1 tablet (40 mg total) by mouth daily. (Patient taking differently: Take by mouth daily. )   gabapentin (NEURONTIN) 300 MG capsule TK 3 CS PO TID   hydrALAZINE  (APRESOLINE) 25 MG tablet Take 25 mg by mouth 3 (three) times daily.   insulin NPH Human (HUMULIN N) 100 UNIT/ML injection Inject up to 40 units a day, as prescribed   insulin regular (HUMULIN R) 100 units/mL injection Inject up to 50 units a day as prescribed   INSULIN SYRINGE 1CC/29G 29G X 1/2" 1 ML MISC USE TWICE DAILY   isosorbide mononitrate (IMDUR) 30 MG 24 hr tablet Take 30 mg by mouth daily.   urea (CARMOL) 10 % cream APPLY TOPICALLY DAILY AS NEEDED FOR FEET    Past Medical History:  Diagnosis Date   Atherosclerotic heart disease of native coronary artery with angina pectoris (HCC)    Diabetes mellitus    Ectodermal dysplasia    Hyperlipidemia    Hypertension    Pseudogout    Type 2 diabetes mellitus with neurological manifestations, uncontrolled ([place] 04/23/2008   Qualifier: Diagnosis of  By: LSilvio PateMD, RBaird Cancer    Past Surgical History:  Procedure Laterality Date   APPENDECTOMY     CATARACT EXTRACTION Left 07/2016   CORONARY BALLOON ANGIOPLASTY N/A 11/09/2017   Procedure: CORONARY BALLOON ANGIOPLASTY;  Surgeon: CYolonda Kida MD;  [place];  Service: Cardiovascular;  Laterality: N/A;   LEFT HEART CATH AND CORONARY ANGIOGRAPHY N/A 11/09/2017   Procedure: LEFT HEART CATH AND CORONARY ANGIOGRAPHY;  Surgeon: CYolonda Kida MD;  [place] [facility];  Service: Cardiovascular;  Laterality: N/A;  RIB FRACTURE SURGERY  1988   right    Social History Social History   Tobacco Use   Smoking status: Never Smoker   Smokeless tobacco: Never Used  Substance Use Topics   Alcohol use: No   Drug use: No    Family History Family History  Problem Relation Age of Onset   Arthritis Mother    Arthritis Father     No Known Allergies   REVIEW OF SYSTEMS (Negative unless checked)  Constitutional: [] Weight loss  [] Fever  [] Chills Cardiac: [] Chest pain   [] Chest pressure   [] Palpitations   [] Shortness of breath  when laying flat   [] Shortness of breath with exertion. Vascular:  [] Pain in legs with walking   [x] Pain in legs at rest  [] History of DVT   [] Phlebitis   [x] Swelling in legs   [] Varicose veins   [] Non-healing ulcers Pulmonary:   [] Uses home oxygen   [] Productive cough   [] Hemoptysis   [] Wheeze  [] COPD   [] Asthma Neurologic:  [] Dizziness   [] Seizures   [] History of stroke   [] History of TIA  [] Aphasia   [] Vissual changes   [] Weakness or numbness in arm   [] Weakness or numbness in leg Musculoskeletal:   [] Joint swelling   [] Joint pain   [] Low back pain Hematologic:  [] Easy bruising  [] Easy bleeding   [] Hypercoagulable state   [] Anemic Gastrointestinal:  [] Diarrhea   [] Vomiting  [] Gastroesophageal reflux/heartburn   [] Difficulty swallowing. Genitourinary:  [] Chronic kidney disease   [] Difficult urination  [] Frequent urination   [] Blood in urine Skin:  [] Rashes   [] Ulcers  Psychological:  [] History of anxiety   []  History of major depression.  Physical Examination  Vitals:   09/09/18 1134  BP: 129/75  Pulse: 85  Resp: 16  Weight: 250 lb (113.4 kg)  Height: 5' 9"  (1.753 m)   Body mass index is 36.92 kg/m. Gen: WD/WN, NAD Head: [place]/AT, No temporalis wasting.  Ear/Nose/Throat: Hearing grossly intact, nares w/o erythema or drainage Eyes: PER, EOMI, sclera nonicteric.  Neck: Supple, no large masses.   Pulmonary:  Good air movement, no audible wheezing bilaterally, no use of accessory muscles.  Cardiac: RRR, no JVD Vascular: scattered varicosities present bilaterally.  Severe venous stasis changes to the legs bilaterally.  2-3+ soft pitting edema Vessel Right Left  Radial Palpable Palpable  PT Palpable Palpable  DP Palpable Palpable  Gastrointestinal: Non-distended. No guarding/no peritoneal signs.  Musculoskeletal: M/S 5/5 throughout.  No deformity or atrophy.  Neurologic: CN 2-12 intact. Symmetrical.  Speech is fluent. Motor exam as listed above. Psychiatric: Judgment intact, Mood &  affect appropriate for pt's clinical situation. Dermatologic: venous rashes no ulcers noted.  No changes consistent with cellulitis. Lymph : No lichenification or skin changes of chronic lymphedema.  CBC Lab Results  Component Value Date   WBC 5.8 11/13/2017   HGB 10.6 (L) 11/13/2017   HCT 30.7 (L) 11/13/2017   MCV 84.7 11/13/2017   PLT 212 11/13/2017    BMET    Component Value Date/Time   NA 136 11/14/2017 0311   NA 137 11/14/2017 0311   K 4.5 11/14/2017 0311   K 4.6 11/14/2017 0311   CL 105 11/14/2017 0311   CL 106 11/14/2017 0311   CO2 22 11/14/2017 0311   CO2 22 11/14/2017 0311   GLUCOSE 143 (H) 11/14/2017 0311   GLUCOSE 147 (H) 11/14/2017 0311   BUN 53 (H) 11/14/2017 0311   BUN 59 (H) 11/14/2017 0311   CREATININE 3.07 (H) 11/14/2017 2725  CREATININE 3.08 (H) 11/14/2017 0311   CALCIUM 8.8 (L) 11/14/2017 0311   CALCIUM 8.9 11/14/2017 0311   GFRNONAA 21 (L) 11/14/2017 0311   GFRNONAA 21 (L) 11/14/2017 0311   GFRAA 24 (L) 11/14/2017 0311   GFRAA 24 (L) 11/14/2017 0311   CrCl cannot be calculated (Patient's most recent lab result is older than the maximum 21 days allowed.).  COAG Lab Results  Component Value Date   INR 1.05 11/09/2017    Radiology No results found.    Assessment/Plan 1. Chronic venous insufficiency No surgery or intervention at this point in time.    I have reviewed my discussion with the patient regarding venous insufficiency and secondary lymph edema and why it  causes symptoms. I have discussed with the patient the chronic skin changes that accompany these problems and the long term sequela such as ulceration and infection.  Patient will continue wearing graduated compression stockings class 1 (20-30 mmHg) on a daily basis a prescription was given to the patient to keep this updated. The patient will  put the stockings on first thing in the morning and removing them in the evening. The patient is instructed specifically not to sleep in the  stockings.  In addition, behavioral modification including elevation during the day will be continued.  Diet and salt restriction was also discussed.  Previous duplex ultrasound of the lower extremities shows normal deep venous system, superficial reflux was not present.   Following the review of the ultrasound the patient will follow up in 12 months to reassess the degree of swelling and the control that graduated compression is offering.   The patient can be assessed for a Lymph Pump at that time.  However, at this time the patient states they are satisfied with the control compression and elevation is yielding.    2. PAD (peripheral artery disease) (HCC) Recommend:  I do not find evidence of life style limiting vascular disease. The patient specifically denies life style limitation.  Previous noninvasive studies including ABI's of the legs do not identify critical vascular problems.  The patient should continue walking and begin a more formal exercise program. The patient should continue his antiplatelet therapy and aggressive treatment of the lipid abnormalities.  3. Atherosclerosis of native coronary artery of native heart with angina pectoris ([place]) Continue cardiac and antihypertensive medications as already ordered and reviewed, no changes at this time.  Continue statin as ordered and reviewed, no changes at this time  Nitrates PRN for chest pain   4. Essential hypertension, benign Continue antihypertensive medications as already ordered, these medications have been reviewed and there are no changes at this time.   5. Hyperlipidemia, unspecified hyperlipidemia type Continue statin as ordered and reviewed, no changes at this time     Hortencia Pilar, MD  09/09/2018 11:46 AM

## 2018-09-10 DIAGNOSIS — Z794 Long term (current) use of insulin: Secondary | ICD-10-CM | POA: Diagnosis not present

## 2018-09-10 DIAGNOSIS — L851 Acquired keratosis [keratoderma] palmaris et plantaris: Secondary | ICD-10-CM | POA: Diagnosis not present

## 2018-09-10 DIAGNOSIS — B351 Tinea unguium: Secondary | ICD-10-CM | POA: Diagnosis not present

## 2018-09-10 DIAGNOSIS — E1140 Type 2 diabetes mellitus with diabetic neuropathy, unspecified: Secondary | ICD-10-CM | POA: Diagnosis not present

## 2018-09-11 ENCOUNTER — Encounter (INDEPENDENT_AMBULATORY_CARE_PROVIDER_SITE_OTHER): Payer: Medicare Other | Admitting: Ophthalmology

## 2018-09-11 ENCOUNTER — Other Ambulatory Visit: Payer: Self-pay

## 2018-09-11 DIAGNOSIS — E11311 Type 2 diabetes mellitus with unspecified diabetic retinopathy with macular edema: Secondary | ICD-10-CM | POA: Diagnosis not present

## 2018-09-11 DIAGNOSIS — E113313 Type 2 diabetes mellitus with moderate nonproliferative diabetic retinopathy with macular edema, bilateral: Secondary | ICD-10-CM | POA: Diagnosis not present

## 2018-09-11 DIAGNOSIS — I10 Essential (primary) hypertension: Secondary | ICD-10-CM | POA: Diagnosis not present

## 2018-09-11 DIAGNOSIS — H35033 Hypertensive retinopathy, bilateral: Secondary | ICD-10-CM | POA: Diagnosis not present

## 2018-09-11 DIAGNOSIS — H43813 Vitreous degeneration, bilateral: Secondary | ICD-10-CM

## 2018-09-23 ENCOUNTER — Telehealth: Payer: Self-pay | Admitting: Gastroenterology

## 2018-09-23 NOTE — Telephone Encounter (Signed)
Patient is scheduled for a colonoscopy on 10-02-18 with dr T & needs a covid test prior to procedure. Please contact with info.

## 2018-09-24 NOTE — Telephone Encounter (Signed)
Pt notified to have Covid-19 testing done this Friday where they have drive [facility]

## 2018-09-26 DIAGNOSIS — E782 Mixed hyperlipidemia: Secondary | ICD-10-CM | POA: Diagnosis not present

## 2018-09-26 DIAGNOSIS — I10 Essential (primary) hypertension: Secondary | ICD-10-CM | POA: Diagnosis not present

## 2018-09-26 DIAGNOSIS — R0600 Dyspnea, unspecified: Secondary | ICD-10-CM | POA: Diagnosis not present

## 2018-09-26 DIAGNOSIS — I482 Chronic atrial fibrillation, unspecified: Secondary | ICD-10-CM | POA: Diagnosis not present

## 2018-09-26 DIAGNOSIS — I2510 Atherosclerotic heart disease of native coronary artery without angina pectoris: Secondary | ICD-10-CM | POA: Diagnosis not present

## 2018-09-27 ENCOUNTER — Other Ambulatory Visit: Payer: Self-pay

## 2018-09-27 ENCOUNTER — Other Ambulatory Visit
Admission: RE | Admit: 2018-09-27 | Discharge: 2018-09-27 | Disposition: A | Discharge status: Home / Self Care | Payer: Medicare Other | Source: Ambulatory Visit | Attending: Gastroenterology | Admitting: Gastroenterology

## 2018-09-27 DIAGNOSIS — Z20828 Contact with and (suspected) exposure to other viral communicable diseases: Secondary | ICD-10-CM | POA: Insufficient documentation

## 2018-09-27 DIAGNOSIS — Z01812 Encounter for preprocedural laboratory examination: Secondary | ICD-10-CM | POA: Insufficient documentation

## 2018-09-28 LAB — SARS CORONAVIRUS 2 (TAT 6-24 HRS): SARS Coronavirus 2: NEGATIVE

## 2018-10-02 ENCOUNTER — Encounter: Payer: Self-pay | Admitting: Certified Registered"

## 2018-10-02 ENCOUNTER — Encounter
Admission: RE | Disposition: A | Discharge status: Home / Self Care | Payer: Self-pay | Source: Home / Self Care | Attending: Gastroenterology

## 2018-10-02 ENCOUNTER — Ambulatory Visit
Admission: RE | Admit: 2018-10-02 | Discharge: 2018-10-02 | Disposition: A | Discharge status: Home / Self Care | Payer: Medicare Other | Attending: Gastroenterology | Admitting: Gastroenterology

## 2018-10-02 DIAGNOSIS — R195 Other fecal abnormalities: Secondary | ICD-10-CM | POA: Diagnosis not present

## 2018-10-02 DIAGNOSIS — Z5309 Procedure and treatment not carried out because of other contraindication: Secondary | ICD-10-CM | POA: Diagnosis not present

## 2018-10-02 DIAGNOSIS — Z1211 Encounter for screening for malignant neoplasm of colon: Secondary | ICD-10-CM

## 2018-10-02 DIAGNOSIS — R1319 Other dysphagia: Secondary | ICD-10-CM

## 2018-10-02 LAB — GLUCOSE, CAPILLARY: Glucose-Capillary: 144 mg/dL — ABNORMAL HIGH (ref 70–99)

## 2018-10-02 SURGERY — COLONOSCOPY WITH PROPOFOL
Anesthesia: General

## 2018-10-02 MED ORDER — PROPOFOL 500 MG/50ML IV EMUL
INTRAVENOUS | Status: AC
  Filled 2018-10-02: qty 50

## 2018-10-02 MED ORDER — PROPOFOL 10 MG/ML IV BOLUS
INTRAVENOUS | Status: AC
  Filled 2018-10-02: qty 20

## 2018-10-02 MED ORDER — SODIUM CHLORIDE 0.9 % IV SOLN: INTRAVENOUS | Status: DC

## 2018-10-02 NOTE — Progress Notes (Signed)
Patient cnacelled due to taking Plavix the day before the procedure

## 2018-10-04 DIAGNOSIS — R079 Chest pain, unspecified: Secondary | ICD-10-CM | POA: Diagnosis not present

## 2018-10-04 DIAGNOSIS — R4020 Unspecified coma: Secondary | ICD-10-CM | POA: Diagnosis not present

## 2018-10-04 DIAGNOSIS — R1111 Vomiting without nausea: Secondary | ICD-10-CM | POA: Diagnosis not present

## 2018-10-09 ENCOUNTER — Encounter (INDEPENDENT_AMBULATORY_CARE_PROVIDER_SITE_OTHER): Payer: Medicare Other | Admitting: Ophthalmology

## 2018-10-14 ENCOUNTER — Encounter: Payer: Self-pay | Admitting: Internal Medicine

## 2018-10-17 ENCOUNTER — Telehealth: Payer: Self-pay | Admitting: Internal Medicine

## 2018-10-17 NOTE — Telephone Encounter (Signed)
I called his wife to pass on my condolences

## 2018-10-17 NOTE — Telephone Encounter (Signed)
Aaron Mckenzie, patient's friend, called to let Dr.Letvak know patient passed away on 10/17/2018. He said patient had a massive heart attack.

## 2018-10-29 DIAGNOSIS — 419620001 Death: Secondary | SNOMED CT | POA: Diagnosis not present

## 2018-10-29 DEATH — deceased

## 2018-10-31 ENCOUNTER — Ambulatory Visit: Payer: Medicare Other | Admitting: Internal Medicine

## 2019-03-17 ENCOUNTER — Encounter: Payer: Medicare Other | Admitting: Internal Medicine

## 2019-03-18 ENCOUNTER — Encounter: Payer: Medicare Other | Admitting: Internal Medicine

## 2019-09-11 ENCOUNTER — Ambulatory Visit (INDEPENDENT_AMBULATORY_CARE_PROVIDER_SITE_OTHER): Payer: Medicare Other | Admitting: Vascular Surgery
